# Patient Record
Sex: Female | Born: 1950 | Race: Black or African American | Hispanic: No | Smoking: Never smoker
Health system: Southern US, Community
[De-identification: ages and names within clinical notes are randomized; demographics above are authoritative.]

## PROBLEM LIST (undated history)

## (undated) DIAGNOSIS — Z803 Family history of malignant neoplasm of breast: Secondary | ICD-10-CM

## (undated) DIAGNOSIS — C801 Malignant (primary) neoplasm, unspecified: Secondary | ICD-10-CM

## (undated) DIAGNOSIS — C50919 Malignant neoplasm of unspecified site of unspecified female breast: Secondary | ICD-10-CM

## (undated) DIAGNOSIS — J40 Bronchitis, not specified as acute or chronic: Secondary | ICD-10-CM

## (undated) DIAGNOSIS — K219 Gastro-esophageal reflux disease without esophagitis: Secondary | ICD-10-CM

## (undated) DIAGNOSIS — I10 Essential (primary) hypertension: Secondary | ICD-10-CM

## (undated) DIAGNOSIS — E119 Type 2 diabetes mellitus without complications: Secondary | ICD-10-CM

## (undated) DIAGNOSIS — E7800 Pure hypercholesterolemia, unspecified: Secondary | ICD-10-CM

## (undated) DIAGNOSIS — Z800 Family history of malignant neoplasm of digestive organs: Secondary | ICD-10-CM

## (undated) DIAGNOSIS — Z8042 Family history of malignant neoplasm of prostate: Secondary | ICD-10-CM

## (undated) HISTORY — PX: COLONOSCOPY: SHX174

## (undated) HISTORY — DX: Family history of malignant neoplasm of prostate: Z80.42

## (undated) HISTORY — PX: TUBAL LIGATION: SHX77

## (undated) HISTORY — PX: WISDOM TOOTH EXTRACTION: SHX21

## (undated) HISTORY — DX: Family history of malignant neoplasm of digestive organs: Z80.0

## (undated) HISTORY — PX: ABDOMINAL HYSTERECTOMY: SUR658

## (undated) HISTORY — DX: Family history of malignant neoplasm of breast: Z80.3

## (undated) HISTORY — PX: ABDOMINAL HYSTERECTOMY: SHX81

---

## 2002-12-07 ENCOUNTER — Encounter: Payer: Self-pay | Admitting: Internal Medicine

## 2002-12-07 ENCOUNTER — Encounter: Admission: RE | Admit: 2002-12-07 | Discharge: 2002-12-07 | Payer: Self-pay | Admitting: Internal Medicine

## 2003-11-12 ENCOUNTER — Encounter: Admission: RE | Admit: 2003-11-12 | Discharge: 2004-02-10 | Payer: Self-pay | Admitting: Internal Medicine

## 2003-11-30 ENCOUNTER — Encounter (INDEPENDENT_AMBULATORY_CARE_PROVIDER_SITE_OTHER): Payer: Self-pay | Admitting: Specialist

## 2003-11-30 ENCOUNTER — Ambulatory Visit (HOSPITAL_COMMUNITY): Admission: RE | Admit: 2003-11-30 | Discharge: 2003-11-30 | Payer: Self-pay | Admitting: *Deleted

## 2004-02-14 ENCOUNTER — Other Ambulatory Visit: Admission: RE | Admit: 2004-02-14 | Discharge: 2004-02-14 | Payer: Self-pay | Admitting: Obstetrics and Gynecology

## 2004-03-13 ENCOUNTER — Encounter: Admission: RE | Admit: 2004-03-13 | Discharge: 2004-03-13 | Payer: Self-pay | Admitting: Obstetrics and Gynecology

## 2006-05-20 ENCOUNTER — Encounter: Admission: RE | Admit: 2006-05-20 | Discharge: 2006-05-20 | Payer: Self-pay | Admitting: Internal Medicine

## 2007-07-14 ENCOUNTER — Encounter: Admission: RE | Admit: 2007-07-14 | Discharge: 2007-07-14 | Payer: Self-pay | Admitting: Internal Medicine

## 2008-07-18 ENCOUNTER — Encounter: Admission: RE | Admit: 2008-07-18 | Discharge: 2008-07-18 | Payer: Self-pay | Admitting: Internal Medicine

## 2009-07-24 ENCOUNTER — Encounter: Admission: RE | Admit: 2009-07-24 | Discharge: 2009-07-24 | Payer: Self-pay | Admitting: Internal Medicine

## 2010-08-18 ENCOUNTER — Encounter
Admission: RE | Admit: 2010-08-18 | Discharge: 2010-08-18 | Payer: Self-pay | Source: Home / Self Care | Attending: Internal Medicine | Admitting: Internal Medicine

## 2010-08-24 ENCOUNTER — Encounter: Payer: Self-pay | Admitting: Internal Medicine

## 2010-08-24 ENCOUNTER — Encounter: Payer: Self-pay | Admitting: Cardiology

## 2011-08-11 ENCOUNTER — Other Ambulatory Visit: Payer: Self-pay | Admitting: Internal Medicine

## 2011-08-11 DIAGNOSIS — Z1231 Encounter for screening mammogram for malignant neoplasm of breast: Secondary | ICD-10-CM

## 2011-08-31 ENCOUNTER — Ambulatory Visit
Admission: RE | Admit: 2011-08-31 | Discharge: 2011-08-31 | Disposition: A | Discharge status: Home / Self Care | Payer: BC Managed Care – PPO | Source: Ambulatory Visit | Attending: Internal Medicine | Admitting: Internal Medicine

## 2011-08-31 DIAGNOSIS — Z1231 Encounter for screening mammogram for malignant neoplasm of breast: Secondary | ICD-10-CM

## 2012-11-07 ENCOUNTER — Other Ambulatory Visit: Payer: Self-pay

## 2012-11-07 DIAGNOSIS — Z1231 Encounter for screening mammogram for malignant neoplasm of breast: Secondary | ICD-10-CM

## 2012-12-13 ENCOUNTER — Ambulatory Visit
Admission: RE | Admit: 2012-12-13 | Discharge: 2012-12-13 | Disposition: A | Discharge status: Home / Self Care | Payer: BC Managed Care – PPO | Source: Ambulatory Visit

## 2012-12-13 DIAGNOSIS — Z1231 Encounter for screening mammogram for malignant neoplasm of breast: Secondary | ICD-10-CM

## 2013-12-05 ENCOUNTER — Other Ambulatory Visit: Payer: Self-pay

## 2013-12-05 DIAGNOSIS — Z1231 Encounter for screening mammogram for malignant neoplasm of breast: Secondary | ICD-10-CM

## 2013-12-14 ENCOUNTER — Encounter (INDEPENDENT_AMBULATORY_CARE_PROVIDER_SITE_OTHER): Payer: Self-pay

## 2013-12-14 ENCOUNTER — Ambulatory Visit
Admission: RE | Admit: 2013-12-14 | Discharge: 2013-12-14 | Disposition: A | Discharge status: Home / Self Care | Payer: BC Managed Care – PPO | Source: Ambulatory Visit

## 2013-12-14 DIAGNOSIS — Z1231 Encounter for screening mammogram for malignant neoplasm of breast: Secondary | ICD-10-CM

## 2015-03-21 ENCOUNTER — Ambulatory Visit: Payer: Self-pay | Admitting: Family Medicine

## 2016-06-17 ENCOUNTER — Other Ambulatory Visit: Payer: Self-pay | Admitting: Internal Medicine

## 2016-06-17 DIAGNOSIS — N63 Unspecified lump in unspecified breast: Secondary | ICD-10-CM

## 2016-07-23 ENCOUNTER — Ambulatory Visit
Admission: RE | Admit: 2016-07-23 | Discharge: 2016-07-23 | Disposition: A | Discharge status: Home / Self Care | Payer: Medicare Other | Source: Ambulatory Visit | Attending: Internal Medicine | Admitting: Internal Medicine

## 2016-07-23 ENCOUNTER — Other Ambulatory Visit: Payer: Self-pay | Admitting: Internal Medicine

## 2016-07-23 DIAGNOSIS — N63 Unspecified lump in unspecified breast: Secondary | ICD-10-CM

## 2016-07-24 ENCOUNTER — Ambulatory Visit
Admission: RE | Admit: 2016-07-24 | Discharge: 2016-07-24 | Disposition: A | Discharge status: Home / Self Care | Payer: Medicare Other | Source: Ambulatory Visit | Attending: Internal Medicine | Admitting: Internal Medicine

## 2016-07-24 DIAGNOSIS — N63 Unspecified lump in unspecified breast: Secondary | ICD-10-CM

## 2016-07-28 ENCOUNTER — Telehealth: Payer: Self-pay | Admitting: *Deleted

## 2016-07-28 NOTE — Telephone Encounter (Signed)
Confirmed [facility] for 08/05/16 at 815am .  Instructions and contact information given.

## 2016-07-31 ENCOUNTER — Other Ambulatory Visit: Payer: Self-pay | Admitting: *Deleted

## 2016-07-31 DIAGNOSIS — C50411 Malignant neoplasm of upper-outer quadrant of right female breast: Secondary | ICD-10-CM

## 2016-07-31 DIAGNOSIS — Z170 Estrogen receptor positive status [ER+]: Principal | ICD-10-CM

## 2016-08-05 ENCOUNTER — Ambulatory Visit: Payer: Self-pay | Admitting: General Surgery

## 2016-08-05 ENCOUNTER — Encounter: Payer: Self-pay | Admitting: *Deleted

## 2016-08-05 ENCOUNTER — Encounter: Payer: Self-pay | Admitting: Genetic Counselor

## 2016-08-05 ENCOUNTER — Other Ambulatory Visit: Payer: Self-pay | Admitting: *Deleted

## 2016-08-05 ENCOUNTER — Other Ambulatory Visit (HOSPITAL_BASED_OUTPATIENT_CLINIC_OR_DEPARTMENT_OTHER): Payer: Medicare Other

## 2016-08-05 ENCOUNTER — Ambulatory Visit (HOSPITAL_BASED_OUTPATIENT_CLINIC_OR_DEPARTMENT_OTHER): Payer: Medicare Other | Admitting: Hematology and Oncology

## 2016-08-05 ENCOUNTER — Other Ambulatory Visit: Payer: Self-pay | Admitting: Hematology and Oncology

## 2016-08-05 ENCOUNTER — Ambulatory Visit
Admission: RE | Admit: 2016-08-05 | Discharge: 2016-08-05 | Disposition: A | Discharge status: Home / Self Care | Payer: Medicare Other | Source: Ambulatory Visit | Attending: Radiation Oncology | Admitting: Radiation Oncology

## 2016-08-05 ENCOUNTER — Ambulatory Visit: Payer: Medicare Other | Attending: General Surgery | Admitting: Physical Therapy

## 2016-08-05 ENCOUNTER — Encounter: Payer: Self-pay | Admitting: Radiation Oncology

## 2016-08-05 VITALS — BP 162/75 | HR 87 | Temp 97.9°F | Resp 18 | Ht 63.5 in | Wt 171.2 lb

## 2016-08-05 DIAGNOSIS — C50411 Malignant neoplasm of upper-outer quadrant of right female breast: Secondary | ICD-10-CM

## 2016-08-05 DIAGNOSIS — Z170 Estrogen receptor positive status [ER+]: Secondary | ICD-10-CM

## 2016-08-05 DIAGNOSIS — R293 Abnormal posture: Secondary | ICD-10-CM | POA: Diagnosis present

## 2016-08-05 HISTORY — DX: Type 2 diabetes mellitus without complications: E11.9

## 2016-08-05 HISTORY — DX: Essential (primary) hypertension: I10

## 2016-08-05 LAB — COMPREHENSIVE METABOLIC PANEL
ALBUMIN: 4.3 g/dL (ref 3.5–5.0)
ALK PHOS: 106 U/L (ref 40–150)
ALT: 13 U/L (ref 0–55)
AST: 13 U/L (ref 5–34)
Anion Gap: 12 mEq/L — ABNORMAL HIGH (ref 3–11)
BUN: 11.2 mg/dL (ref 7.0–26.0)
CALCIUM: 10.1 mg/dL (ref 8.4–10.4)
CO2: 25 mEq/L (ref 22–29)
CREATININE: 0.8 mg/dL (ref 0.6–1.1)
Chloride: 103 mEq/L (ref 98–109)
EGFR: >90 mL/min/{1.73_m2} (ref >90)
GLUCOSE: 205 mg/dL — AB (ref 70–140)
POTASSIUM: 3.9 meq/L (ref 3.5–5.1)
SODIUM: 140 meq/L (ref 136–145)
Total Bilirubin: 0.33 mg/dL (ref 0.20–1.20)
Total Protein: 8.2 g/dL (ref 6.4–8.3)

## 2016-08-05 LAB — CBC WITH DIFFERENTIAL/PLATELET
BASO%: 0.1 % (ref 0.0–2.0)
BASOS ABS: 0 10*3/uL (ref 0.0–0.1)
EOS%: 3.3 % (ref 0.0–7.0)
Eosinophils Absolute: 0.3 10*3/uL (ref 0.0–0.5)
HEMATOCRIT: 40 % (ref 34.8–46.6)
HEMOGLOBIN: 13.5 g/dL (ref 11.6–15.9)
LYMPH#: 2.3 10*3/uL (ref 0.9–3.3)
LYMPH%: 27.6 % (ref 14.0–49.7)
MCH: 28.4 pg (ref 25.1–34.0)
MCHC: 33.8 g/dL (ref 31.5–36.0)
MCV: 84.2 fL (ref 79.5–101.0)
MONO#: 0.7 10*3/uL (ref 0.1–0.9)
MONO%: 8.5 % (ref 0.0–14.0)
NEUT%: 60.5 % (ref 38.4–76.8)
NEUTROS ABS: 5.1 10*3/uL (ref 1.5–6.5)
Platelets: 257 10*3/uL (ref 145–400)
RBC: 4.75 10*6/uL (ref 3.70–5.45)
RDW: 12.8 % (ref 11.2–14.5)
WBC: 8.4 10*3/uL (ref 3.9–10.3)

## 2016-08-05 MED ORDER — LIDOCAINE-PRILOCAINE 2.5-2.5 % EX CREA
TOPICAL_CREAM | CUTANEOUS | 3 refills | Status: DC
Start: 2016-08-05 — End: 2016-12-15

## 2016-08-05 MED ORDER — LORAZEPAM 0.5 MG PO TABS: 0.5000 mg | ORAL_TABLET | Freq: Four times a day (QID) | ORAL | 0 refills | Status: DC | PRN

## 2016-08-05 MED ORDER — ONDANSETRON HCL 8 MG PO TABS: 8.0000 mg | ORAL_TABLET | Freq: Two times a day (BID) | ORAL | 1 refills | Status: DC | PRN

## 2016-08-05 MED ORDER — LIDOCAINE-PRILOCAINE 2.5-2.5 % EX CREA
TOPICAL_CREAM | CUTANEOUS | 3 refills | Status: DC
Start: 2016-08-05 — End: 2016-08-05

## 2016-08-05 MED ORDER — PROCHLORPERAZINE MALEATE 10 MG PO TABS: 10.0000 mg | ORAL_TABLET | Freq: Four times a day (QID) | ORAL | 1 refills | Status: DC | PRN

## 2016-08-05 MED ORDER — LIDOCAINE-PRILOCAINE 2.5-2.5 % EX CREA: TOPICAL_CREAM | CUTANEOUS | 3 refills | Status: DC

## 2016-08-05 NOTE — Progress Notes (Signed)
Radiation Oncology         [phone] ________________________________  Name: Casey Olson MRN: [id number]  Date: 08/05/2016  DOB: [birth year]  ZC:HYIFO A Jeanie Cooks, MD  Jovita Kussmaul, MD     REFERRING PHYSICIAN: Autumn Messing III, MD   DIAGNOSIS: The encounter diagnosis was Malignant neoplasm of upper-outer quadrant of right breast in female, estrogen receptor positive ([place]).   HISTORY OF PRESENT ILLNESS: Casey Olson is a 66 y.o. female seen in the multidisciplinary breast clinic for a new diagnosis of right breast cancer. The patient was found to have a palpable mass for about 2 months in the right breast. Diagnostic imaging revealed a 4.7 x 4.1 cm mass in the right breast with suspicious cortical thickening in the right axillary node seen on ultrasound. A biopsy of both the breast tumor and node were performed. The breast biopsy revealed ER weakly positive, PR negative, HER 2 negative, Ki 67 90%, grade 3 invasive ductal carcinoma. Her lymph node was benign on biopsy. She comes today to discusses the options for treatment of her cancer.  PREVIOUS RADIATION THERAPY: No   PAST MEDICAL HISTORY:  Past Medical History:  Diagnosis Date  . Diabetes mellitus without complication ([place])   . Hypertension        PAST SURGICAL HISTORY:No past surgical history on file.   FAMILY HISTORY:  Family History  Problem Relation Age of Onset  . Breast cancer Cousin 65    mat first cousin     SOCIAL HISTORY:   The patient is single and resides in [place]. She denies any tobacco use, or alcohol use. 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 She has two adult daughters, who accompany her.   ALLERGIES: Patient has no allergy information on record.   MEDICATIONS:  No current outpatient prescriptions on file.   No current facility-administered medications for this encounter.      REVIEW OF SYSTEMS: On review of systems, the patient reports that she is doing well  overall. She denies any chest pain, shortness of breath, cough, fevers, chills, night sweats, unintended weight changes. She denies any bowel or bladder disturbances, and denies abdominal pain, nausea or vomiting. She denies any new musculoskeletal or joint aches or pains. A complete review of systems is obtained and is otherwise negative.    PHYSICAL EXAM:  Wt Readings from Last 3 Encounters:  08/05/16 171 lb 3.2 oz (77.7 kg)   Temp Readings from Last 3 Encounters:  08/05/16 97.9 F (36.6 C) (Oral)   BP Readings from Last 3 Encounters:  08/05/16 (!) 162/75   Pulse Readings from Last 3 Encounters:  08/05/16 87    In general this is a well appearing African American female in no acute distress. She is alert and oriented x4 and appropriate throughout the examination. HEENT reveals that the patient is normocephalic, atraumatic. EOMs are intact. PERRLA. Skin is intact without any evidence of gross lesions. Cardiovascular exam reveals a regular rate and rhythm, no clicks rubs or murmurs are auscultated. Chest is clear to auscultation bilaterally. Lymphatic assessment is performed and does not reveal any adenopathy in the cervical, supraclavicular, axillary, or inguinal chains. Abdomen has active bowel sounds in all quadrants and is intact. The abdomen is soft, non tender, non distended. Lower extremities are negative for pretibial pitting edema, deep calf tenderness, cyanosis or clubbing. On examination of the breast right breast, there is minimal ecchymoses of the biopsy site without bleeding. There is fullness in the  upper outer quadrant of the breast, and this is about 3..5 cm in greatest dimension. The left breast is intact without palpable mass or lesions. No nipple discharge is noted of either breast.  ECOG = 1  0 - Asymptomatic (Fully active, able to carry on all predisease activities without restriction)  1 - Symptomatic but completely ambulatory (Restricted in physically strenuous  activity but ambulatory and able to carry out work of a light or sedentary nature. For example, light housework, office work)  2 - Symptomatic, <50% in bed during the day (Ambulatory and capable of all self care but unable to carry out any work activities. Up and about more than 50% of waking hours)  3 - Symptomatic, >50% in bed, but not bedbound (Capable of only limited self-care, confined to bed or chair 50% or more of waking hours)  4 - Bedbound (Completely disabled. Cannot carry on any self-care. Totally confined to bed or chair)  5 - Death   Eustace Pen MM, Creech RH, Tormey DC, et al. [phone]). "Toxicity and response criteria of the [facility]". Cut Off Oncol. 5 (6): 649-55    LABORATORY DATA:  Lab Results  Component Value Date   WBC 8.4 08/05/2016   HGB 13.5 08/05/2016   HCT 40.0 08/05/2016   MCV 84.2 08/05/2016   PLT 257 08/05/2016   Lab Results  Component Value Date   NA 140 08/05/2016   K 3.9 08/05/2016   CO2 25 08/05/2016   Lab Results  Component Value Date   ALT 13 08/05/2016   AST 13 08/05/2016   ALKPHOS 106 08/05/2016   BILITOT 0.33 08/05/2016      RADIOGRAPHY: US Breast Ltd Uni Right Inc Axilla  Result Date: 07/23/2016 CLINICAL DATA:  Right breast palpable mass felt by the patient 2 months ago. Patient also complains of diffuse left breast tenderness. EXAM: 2D DIGITAL DIAGNOSTIC BILATERAL MAMMOGRAM WITH CAD AND ADJUNCT TOMO ULTRASOUND RIGHT BREAST COMPARISON:  Previous exam(s). ACR Breast Density Category d: The breast tissue is extremely dense, which lowers the sensitivity of mammography. FINDINGS: Mammographically, there is a larger isodense to breast parenchyma mass in the right breast upper outer quadrant, posterior depth, mostly seen on the MLO view. There is slight skin thickening and dimpling in the lateral right breast. There is also an enlarged right axillary lymph node. No other masses, areas of architectural distortion or  clustered calcifications are seen bilaterally. Mammographic images were processed with CAD. On physical exam, there is a large firm palpable mass in the right breast upper outer quadrant, posterior depth. Targeted ultrasound is performed, showing right breast 10 o'clock 5 cm from the nipple hypoechoic lobulated solid mass which measures 4.7 by 3.7 by 4.1 cm. Internal blood flow is documented. There is a low right axillary lymph node which demonstrates mild cortical thickening. IMPRESSION: Highly suspicious right breast palpable mass, for which ultrasound-guided core needle biopsy is recommended. Indeterminate low right axillary lymph node, for which ultrasound-guided core needle biopsy is recommended. No mammographic evidence of malignancy in the left breast. RECOMMENDATION: Ultrasound-guided core needle biopsy of right breast and right axilla. I have discussed the findings and recommendations with the patient. Results were also provided in writing at the conclusion of the visit. If applicable, a reminder letter will be sent to the patient regarding the next appointment. BI-RADS CATEGORY  5: Highly suggestive of malignancy. Electronically Signed   By: Fidela Salisbury M.D.   On: 07/23/2016 09:21   Mm Diag Breast Tomo  Bilateral  Result Date: 07/23/2016 CLINICAL DATA:  Right breast palpable mass felt by the patient 2 months ago. Patient also complains of diffuse left breast tenderness. EXAM: 2D DIGITAL DIAGNOSTIC BILATERAL MAMMOGRAM WITH CAD AND ADJUNCT TOMO ULTRASOUND RIGHT BREAST COMPARISON:  Previous exam(s). ACR Breast Density Category d: The breast tissue is extremely dense, which lowers the sensitivity of mammography. FINDINGS: Mammographically, there is a larger isodense to breast parenchyma mass in the right breast upper outer quadrant, posterior depth, mostly seen on the MLO view. There is slight skin thickening and dimpling in the lateral right breast. There is also an enlarged right axillary lymph  node. No other masses, areas of architectural distortion or clustered calcifications are seen bilaterally. Mammographic images were processed with CAD. On physical exam, there is a large firm palpable mass in the right breast upper outer quadrant, posterior depth. Targeted ultrasound is performed, showing right breast 10 o'clock 5 cm from the nipple hypoechoic lobulated solid mass which measures 4.7 by 3.7 by 4.1 cm. Internal blood flow is documented. There is a low right axillary lymph node which demonstrates mild cortical thickening. IMPRESSION: Highly suspicious right breast palpable mass, for which ultrasound-guided core needle biopsy is recommended. Indeterminate low right axillary lymph node, for which ultrasound-guided core needle biopsy is recommended. No mammographic evidence of malignancy in the left breast. RECOMMENDATION: Ultrasound-guided core needle biopsy of right breast and right axilla. I have discussed the findings and recommendations with the patient. Results were also provided in writing at the conclusion of the visit. If applicable, a reminder letter will be sent to the patient regarding the next appointment. BI-RADS CATEGORY  5: Highly suggestive of malignancy. Electronically Signed   By: Fidela Salisbury M.D.   On: 07/23/2016 09:21   Mm Clip Placement Right  Result Date: 07/24/2016 CLINICAL DATA:  Post biopsy mammogram of the right breast for clip placement. EXAM: DIAGNOSTIC RIGHT MAMMOGRAM POST ULTRASOUND BIOPSY COMPARISON:  Previous exam(s). FINDINGS: Mammographic images were obtained following ultrasound guided biopsy of right breast mass and right axillary lymph node. The coil shaped biopsy marking clip is well positioned within the biopsied mass in the upper-outer right breast. [facility] spring shaped biopsy marking clip is positioned at the site of the biopsied lymph node in the right axilla. IMPRESSION: 1. Appropriate positioning of the coil shaped biopsy marking clip within  the mass in the right breast at 10 o'clock. 2. Appropriate positioning of the [facility] biopsy marking clip in the biopsied right axillary lymph node. Final Assessment: Post Procedure Mammograms for Marker Placement Electronically Signed   By: Ammie Ferrier M.D.   On: 07/24/2016 11:01   [place] Rt Breast Bx W Loc Dev 1st Lesion Img Bx Spec US Guide  Addendum Date: 07/29/2016   ADDENDUM REPORT: 07/28/2016 12:37 ADDENDUM: Pathology revealed GRADE III INVASIVE DUCTAL CARCINOMA of the Right breast, 10 o'clock. THERE IS NO EVIDENCE OF CARCINOMA 1 OF 1 LYMPH NODE of the Right axilla. This was found to be concordant by Dr. Franki Cabot. Pathology results were discussed with the patient by telephone. The patient reported doing well after the biopsies with tenderness and itching at the sites. Post biopsy instructions and care were reviewed and questions were answered. The patient was encouraged to call [facility] for any additional concerns. The patient was referred to [facility] on Olson 3, 2018. Pathology results reported by Terie Purser, RN on 07/28/2016. Electronically Signed  By: Ammie Ferrier M.D.   On: 07/28/2016 12:37   Result Date: 07/29/2016 CLINICAL DATA:  66 year old female presenting for ultrasound-guided biopsy of a palpable right breast mass and a right axillary lymph node. EXAM: ULTRASOUND GUIDED RIGHT BREAST CORE NEEDLE BIOPSY COMPARISON:  Previous exam(s). FINDINGS: I met with the patient and we discussed the procedure of ultrasound-guided biopsy, including benefits and alternatives. We discussed the high likelihood of a successful procedure. We discussed the risks of the procedure, including infection, bleeding, tissue injury, clip migration, and inadequate sampling. Informed written consent was given. The usual time-out protocol was performed immediately prior to the procedure. Using  sterile technique and 1% Lidocaine as local anesthetic, under direct ultrasound visualization, a 14 gauge spring-loaded device was used to perform biopsy of the right breast mass at 10 o'clock using a lateral approach. At the conclusion of the procedure a coil shaped tissue marker clip was deployed into the biopsy cavity. Using sterile technique and 1% Lidocaine as local anesthetic, under direct ultrasound visualization, a 14 gauge spring-loaded device was used to perform biopsy of a right axillary lymph node using an inferior approach through the same incision site as used above. At the conclusion of the procedure a spring shaped HydroMARK tissue marker clip was deployed into the biopsy cavity. Follow up 2 view mammogram was performed and dictated separately. IMPRESSION: 1. Ultrasound guided biopsy of the palpable right breast mass at 10 o'clock. No apparent complications. 2. Ultrasound-guided biopsy of a right axillary lymph node. No apparent complications. Electronically Signed: By: Ammie Ferrier M.D. On: 07/24/2016 11:02   [place] Rt Breast Bx W Loc Dev Ea Add Lesion Img Bx Spec US Guide  Addendum Date: 07/29/2016   ADDENDUM REPORT: 07/28/2016 12:37 ADDENDUM: Pathology revealed GRADE III INVASIVE DUCTAL CARCINOMA of the Right breast, 10 o'clock. THERE IS NO EVIDENCE OF CARCINOMA 1 OF 1 LYMPH NODE of the Right axilla. This was found to be concordant by Dr. Franki Cabot. Pathology results were discussed with the patient by telephone. The patient reported doing well after the biopsies with tenderness and itching at the sites. Post biopsy instructions and care were reviewed and questions were answered. The patient was encouraged to call [facility] for any additional concerns. The patient was referred to [facility] on Olson 3, 2018. Pathology results reported by Terie Purser, RN on 07/28/2016.  Electronically Signed   By: Ammie Ferrier M.D.   On: 07/28/2016 12:37   Result Date: 07/29/2016 CLINICAL DATA:  66 year old female presenting for ultrasound-guided biopsy of a palpable right breast mass and a right axillary lymph node. EXAM: ULTRASOUND GUIDED RIGHT BREAST CORE NEEDLE BIOPSY COMPARISON:  Previous exam(s). FINDINGS: I met with the patient and we discussed the procedure of ultrasound-guided biopsy, including benefits and alternatives. We discussed the high likelihood of a successful procedure. We discussed the risks of the procedure, including infection, bleeding, tissue injury, clip migration, and inadequate sampling. Informed written consent was given. The usual time-out protocol was performed immediately prior to the procedure. Using sterile technique and 1% Lidocaine as local anesthetic, under direct ultrasound visualization, a 14 gauge spring-loaded device was used to perform biopsy of the right breast mass at 10 o'clock using a lateral approach. At the conclusion of the procedure a coil shaped tissue marker clip was deployed into the biopsy cavity. Using sterile technique and 1% Lidocaine as local anesthetic, under direct ultrasound visualization, a 14 gauge  spring-loaded device was used to perform biopsy of a right axillary lymph node using an inferior approach through the same incision site as used above. At the conclusion of the procedure a spring shaped HydroMARK tissue marker clip was deployed into the biopsy cavity. Follow up 2 view mammogram was performed and dictated separately. IMPRESSION: 1. Ultrasound guided biopsy of the palpable right breast mass at 10 o'clock. No apparent complications. 2. Ultrasound-guided biopsy of a right axillary lymph node. No apparent complications. Electronically Signed: By: Ammie Ferrier M.D. On: 07/24/2016 11:02       IMPRESSION/PLAN: 1. Stage IIA, T2N0Mx, high grade, ER weakly positive, PR negative, invasive ductal carcinoma of the right  breast, with a Ki 67 of 90%.  Dr. Lisbeth Renshaw discusses the pathology findings and reviews the nature of breast disease. The consensus from the breast conference include either mastectomy upfront followed by chemotherapy and antiestrogen therapy versus neoadjuvant chemotherapy, possible breast conservation surgery, radiotherapy, and antiestrogen treatment. If she elects and is a candidate for lumpectomy after neoadjuvant therapy, if she chose this route, she would be a candidate for radiotherapy, as above. We discussed the risks, benefits, short, and long term effects of radiotherapy. Dr. Lisbeth Renshaw discusses the delivery and logistics of radiotherapy. If she elected to proceed with lumpectomy, we would plan to see her 2 weeks after surgery to move forward with the simulation and planning process and anticipate starting radiotherapy about 4 weeks after surgery. She states agreement and we will proceed as outlined. She also has completed an MRI screening sheet, and MRI would be considered if she was choosing neoadjuvant treatment. 2. Genetics. The patient is a candidate for genetic testing and will be referred for this.  The above documentation reflects my direct findings during this shared patient visit. Please see the separate note by Dr. Lisbeth Renshaw on this date for the remainder of the patient's plan of care.    Carola Rhine, PAC  This document serves as a record of services personally performed by Kyung Rudd, MD and Shona Simpson, PA. It was created on their behalf by Maryla Morrow, a trained medical scribe. The creation of this record is based on the scribe's personal observations and the provider's statements to them. This document has been checked and approved by the attending provider.

## 2016-08-05 NOTE — Patient Instructions (Signed)

## 2016-08-05 NOTE — Therapy (Signed)
Casey Olson, [place], [address] Phone: [phone]   Fax:  [phone]  Physical Therapy Evaluation  Patient Details  Name: Casey Olson MRN: [id number] Date of Birth: [birth year] Referring Provider: Dr. Autumn Messing  Encounter Date: 08/05/2016      PT End of Session - 08/05/16 1130    Visit Number 1   Number of Visits 1   PT Start Time 1034   PT Stop Time 7371  Also saw pt from 1150-1210 for a total of 28 minutes   PT Time Calculation (min) 8 min   Activity Tolerance Patient tolerated treatment well   Behavior During Therapy [facility] for tasks assessed/performed      Past Medical History:  Diagnosis Date  . Diabetes mellitus without complication ([place])   . Hypertension     History reviewed. No pertinent surgical history.  There were no vitals filed for this visit.       Subjective Assessment - 08/05/16 1131    Subjective Patient reports she is here today to be seen by her medical team for her newly diagnosed right breast cancer.   Patient is accompained by: Family member   Pertinent History Patient was diagnosed on 07/23/16 with right grade 3 invasive ductal carcinoma breast cancer.  It is located in the upper outer quadrant and measures 4.7 cm, is ER positive and PR negative, HEr2 negative and has a Ki67 of 90%.     Patient Stated Goals Reduce lymphedema risk and learn post op shoulder ROM HEP            [facility] PT Assessment - 08/05/16 0001      Assessment   Medical Diagnosis Right breast cancer   Referring Provider Dr. Autumn Messing   Onset Date/Surgical Date 07/23/16   Hand Dominance Right   Prior Therapy none     Precautions   Precautions Other (comment)   Precaution Comments Active cancer     Restrictions   Weight Bearing Restrictions No     Balance Screen   Has the patient fallen in the past 6 months No   Has the patient had a decrease in activity level because of a fear of  falling?  No   Is the patient reluctant to leave their home because of a fear of falling?  No     Home Environment   Living Environment Private residence   Living Arrangements Children  Daughter and 52 y.o. granddaughter   Available Help at Discharge Family     Prior Function   Level of [place] Retired   Leisure She does not exercise     Cognition   Overall Cognitive Status Within Functional Limits for tasks assessed     Posture/Postural Control   Posture/Postural Control Postural limitations   Postural Limitations Rounded Shoulders;Forward head     ROM / Strength   AROM / PROM / Strength AROM;Strength     AROM   AROM Assessment Site Shoulder;Cervical   Right/Left Shoulder Right;Left   Right Shoulder Extension 47 Degrees   Right Shoulder Flexion 140 Degrees   Right Shoulder ABduction 156 Degrees   Right Shoulder Internal Rotation 72 Degrees   Right Shoulder External Rotation 68 Degrees   Left Shoulder Extension 57 Degrees   Left Shoulder Flexion 139 Degrees   Left Shoulder ABduction 158 Degrees   Left Shoulder Internal Rotation 65 Degrees   Left Shoulder External Rotation 80 Degrees   Cervical Flexion WNl  Cervical Extension WNL   Cervical - Right Side Bend WNL   Cervical - Left Side Bend WNL   Cervical - Right Rotation WNL   Cervical - Left Rotation WNL     Strength   Overall Strength Within functional limits for tasks performed           LYMPHEDEMA/ONCOLOGY QUESTIONNAIRE - 08/05/16 1129      Type   Cancer Type Right breast cancer     Lymphedema Assessments   Lymphedema Assessments Upper extremities     Right Upper Extremity Lymphedema   10 cm Proximal to Olecranon Process 31.4 cm   Olecranon Process 26.6 cm   10 cm Proximal to Ulnar Styloid Process 24.1 cm   Just Proximal to Ulnar Styloid Process 17.3 cm   Across Hand at [employer] 20.2 cm   At [place] of 2nd Digit 6.8 cm     Left Upper Extremity Lymphedema   10 cm  Proximal to Olecranon Process 31 cm   Olecranon Process 25.7 cm   10 cm Proximal to Ulnar Styloid Process 23 cm   Just Proximal to Ulnar Styloid Process 17.4 cm   Across Hand at [employer] 19.1 cm   At [place] of 2nd Digit 6.3 cm      Patient was instructed today in a home exercise program today for post op shoulder range of motion. These included active assist shoulder flexion in sitting, scapular retraction, wall walking with shoulder abduction, and hands behind head external rotation.  She was encouraged to do these twice a day, holding 3 seconds and repeating 5 times when permitted by her physician.         PT Education - 08/05/16 1129    Education provided Yes   Education Details Lymphedema risk reduction and post op shoulder ROM HEP   Person(s) Educated Patient;Child(ren)   Methods Explanation;Demonstration;Handout   Comprehension Returned demonstration;Verbalized understanding              Breast Clinic Goals - 08/05/16 1135      Patient will be able to verbalize understanding of pertinent lymphedema risk reduction practices relevant to her diagnosis specifically related to skin care.   Time 1   Period Days   Status Achieved     Patient will be able to return demonstrate and/or verbalize understanding of the post-op home exercise program related to regaining shoulder range of motion.   Time 1   Period Days   Status Achieved     Patient will be able to verbalize understanding of the importance of attending the postoperative After Breast Cancer Class for further lymphedema risk reduction education and therapeutic exercise.   Time 1   Period Days   Status Achieved              Plan - 08/05/16 1131    Clinical Impression Statement Patient was diagnosed on 07/23/16 with right grade 3 invasive ductal carcinoma breast cancer.  It is located in the upper outer quadrant and measures 4.7 cm, is ER positive and PR negative, HEr2 negative and has a Ki67 of 90%.   Her multidisciplinary medical team met prior to her assessments to determine a recommended treatment plan.  She is planning to have a right mastectomy and sentinel node biopsy followed by chemotherapy or she may have neoadjuvant chemotherapy followed by a lumpectomy or mastectomy with a sentinel node biopsy. She will likely undergo radiation and anti-estrogen therapy. She may benefit from post op PT to regain  shoulder ROM and reduce lymphedema risk. Due to her lack of comorbidities that would impact rehab, her eval is of low complexity.   Rehab Potential Excellent   PT Frequency One time visit   PT Treatment/Interventions Patient/family education;Therapeutic exercise   PT Next Visit Plan Will f/u after surgery to determine PT needs   PT Home Exercise Plan Post op shoulder ROM HEP   Consulted and Agree with Plan of Care Patient;Family member/caregiver   Family Member Consulted daughters and sister      Patient will benefit from skilled therapeutic intervention in order to improve the following deficits and impairments:  Postural dysfunction, Decreased knowledge of precautions, Pain, Impaired UE functional use, Decreased range of motion  Visit Diagnosis: Carcinoma of upper-outer quadrant of right breast in female, estrogen receptor positive ([place]) - Plan: PT plan of care cert/re-cert  Abnormal posture - Plan: PT plan of care cert/re-cert      G-Codes - 63/84/66 1136    Functional Assessment Tool Used Clinical Judgement   Functional Limitation Other PT primary   Other PT Primary Current Status (Z9935) At least 1 percent but less than 20 percent impaired, limited or restricted   Other PT Primary Goal Status (T0177) At least 1 percent but less than 20 percent impaired, limited or restricted   Other PT Primary Discharge Status (L3903) At least 1 percent but less than 20 percent impaired, limited or restricted     Patient will follow up at outpatient cancer rehab if needed following surgery.  If  the patient requires physical therapy at that time, a specific plan will be dictated and sent to the referring physician for approval. The patient was educated today on appropriate basic range of motion exercises to begin post operatively and the importance of attending the After Breast Cancer class following surgery.  Patient was educated today on lymphedema risk reduction practices as it pertains to recommendations that will benefit the patient immediately following surgery.  She verbalized good understanding.  No additional physical therapy is indicated at this time.     Problem List Patient Active Problem List   Diagnosis Date Noted  . Breast cancer of upper-outer quadrant of right female breast ([place]) 07/31/2016    Annia Friendly, PT 08/05/16 12:14 PM  [place], [place], [address] Phone: [phone]   Fax:  [phone]  Name: Casey Olson MRN: [id number] Date of Birth: 05-20-1951

## 2016-08-05 NOTE — Progress Notes (Signed)
[facility] CONSULT NOTE  Patient Care Team: Nolene Ebbs, MD as PCP - General (Internal Medicine) Autumn Messing III, MD as Consulting Physician (General Surgery) Nicholas Lose, MD as Consulting Physician (Hematology and Oncology) Kyung Rudd, MD as Consulting Physician (Radiation Oncology)  CHIEF COMPLAINTS/PURPOSE OF CONSULTATION:  Newly diagnosed breast cancer  HISTORY OF PRESENTING ILLNESS:  Casey Olson 66 y.o. female is here because of recent diagnosis of right breast invasive ductal carcinoma. Patient felt a palpable right breast mass with a past 2 months. She underwent mammogram and ultrasound that revealed a 4.7 cm tumor with enlarged axillary lymph node. Biopsy of the tumor revealed invasive ductal carcinoma that was ER 30% weakly positive PR negative and HER-2 negative with a Ki-67 of 90%. It was grade 3. Lymph node was benign. She was presented this morning in the multidisciplinary tumor board and she is here today accompanied by her family to discuss treatment plan.  I reviewed her records extensively and collaborated the history with the patient.  SUMMARY OF ONCOLOGIC HISTORY:   Breast cancer of upper-outer quadrant of right female breast ([place])   07/24/2016 Initial Diagnosis    Palpable right breast mass for 2 months UOQ at 10:00: 4.7 x 4.1 x 3.7 cm with enlarged axillary lymph node biopsy benign, breast mass biopsy grade 3 IDC ER 30% week PR negative HER-2 negative Ki-67 90%      MEDICAL HISTORY:  Past Medical History:  Diagnosis Date  . Diabetes mellitus without complication ([place])   . Hypertension     SURGICAL HISTORY: No past surgical history on file.  SOCIAL HISTORY:Denies any tobacco alcohol or recreational drug use ALLERGIES:  has no allergies on file. Family history: She works as a Presenter, broadcasting. She has 2 daughters. MEDICATIONS: Does not take any medications REVIEW OF SYSTEMS:   Constitutional: Denies fevers, chills or abnormal  night sweats Eyes: Denies blurriness of vision, double vision or watery eyes Ears, nose, mouth, throat, and face: Denies mucositis or sore throat Respiratory: Denies cough, dyspnea or wheezes Cardiovascular: Denies palpitation, chest discomfort or lower extremity swelling Gastrointestinal:  Denies nausea, heartburn or change in bowel habits Skin: Denies abnormal skin rashes Lymphatics: Denies new lymphadenopathy or easy bruising Neurological:Denies numbness, tingling or new weaknesses Behavioral/Psych: Mood is stable, no new changes  Breast: Palpable lump in the right breast All other systems were reviewed with the patient and are negative.  PHYSICAL EXAMINATION: ECOG PERFORMANCE STATUS: 1 - Symptomatic but completely ambulatory  Vitals:   08/05/16 0928  BP: (!) 162/75  Pulse: 87  Resp: 18  Temp: 97.9 F (36.6 C)   Filed Weights   08/05/16 0928  Weight: 171 lb 3.2 oz (77.7 kg)    GENERAL:alert, no distress and comfortable SKIN: skin color, texture, turgor are normal, no rashes or significant lesions EYES: normal, conjunctiva are pink and non-injected, sclera clear OROPHARYNX:no exudate, no erythema and lips, buccal mucosa, and tongue normal  NECK: supple, thyroid normal size, non-tender, without nodularity LYMPH:  no palpable lymphadenopathy in the cervical, axillary or inguinal LUNGS: clear to auscultation and percussion with normal breathing effort HEART: regular rate & rhythm and no murmurs and no lower extremity edema ABDOMEN:abdomen soft, non-tender and normal bowel sounds Musculoskeletal:no cyanosis of digits and no clubbing  PSYCH: alert & oriented x 3 with fluent speech NEURO: no focal motor/sensory deficits BREAST:Palpable lump in the right breast. No palpable axillary or supraclavicular lymphadenopathy (exam performed in the presence of a chaperone)   LABORATORY  DATA:  I have reviewed the data as listed Lab Results  Component Value Date   WBC 8.4 08/05/2016    HGB 13.5 08/05/2016   HCT 40.0 08/05/2016   MCV 84.2 08/05/2016   PLT 257 08/05/2016   Lab Results  Component Value Date   NA 140 08/05/2016   K 3.9 08/05/2016   CO2 25 08/05/2016    RADIOGRAPHIC STUDIES: I have personally reviewed the radiological reports and agreed with the findings in the report.  ASSESSMENT AND PLAN:  Breast cancer of upper-outer quadrant of right female breast ([place]) 07/24/2016: Palpable right breast mass for 2 months UOQ at 10:00: 4.7 x 4.1 x 3.7 cm with enlarged axillary lymph node biopsy benign, breast mass biopsy grade 3 IDC ER 30% week PR negative HER-2 negative Ki-67 90%  Pathology and radiology counseling: Discussed with the patient, the details of pathology including the type of breast cancer,the clinical staging, the significance of ER, PR and HER-2/neu receptors and the implications for treatment. After reviewing the pathology in detail, we proceeded to discuss the different treatment options between surgery, radiation, chemotherapy, antiestrogen therapies.  Recommendation based on multidisciplinary tumor board: 1. Neoadjuvant chemotherapy with Adriamycin and Cytoxan dose dense 4 followed by Abraxane weekly 12 (Patient cannot state steroids due to diabetes and so we are not using Taxol) 2. Followed by breast conserving surgery with sentinel lymph node study vs mastectomy 3. Followed by adjuvant radiation therapy 4. Followed by adjuvant antiestrogen therapy  Chemotherapy Counseling: I discussed the risks and benefits of chemotherapy including the risks of nausea/ vomiting, risk of infection from low WBC count, fatigue due to chemo or anemia, bruising or bleeding due to low platelets, mouth sores, loss/ change in taste and decreased appetite. Liver and kidney function will be monitored through out chemotherapy as abnormalities in liver and kidney function may be a side effect of treatment. Cardiac dysfunction due to Adriamycin was discussed in detail. Risk of  permanent bone marrow dysfunction due to chemo were also discussed.  Plan: 1. Port placement 2. Echocardiogram 3. Chemotherapy class 4. Breast MRI 5. Genetic counseling will also be arranged  Return to clinic in 2 weeks to start chemotherapy.  All questions were answered. The patient knows to call the clinic with any problems, questions or concerns.    Rulon Eisenmenger, MD 08/05/16

## 2016-08-05 NOTE — Progress Notes (Signed)
START OFF PATHWAY REGIMEN - Breast  Off Pathway: Doxorubicin + Cyclophosphamide (AC) followed by Nab-Paclitaxel (Abraxane(R)) 100 mg/m2 Days 1, 8, and 15 q21 days  [id number]:Doxorubicin + Cyclophosphamide (AC):   A cycle is every 21 days:     Doxorubicin (Adriamycin(R)) 60 mg/m2 IV Push followed by Dose Mod: None     Cyclophosphamide (Cytoxan(R)) 600 mg/m2 in 250 mL NS IV over 30 minutes Dose Mod: None  **Always confirm dose/schedule in your pharmacy ordering system**    OFF02621:Nab-Paclitaxel (Abraxane(R)) 100 mg/m2 Days 1, 8, and 15 q21 days:   A cycle is 21 days:     Nab-paclitaxel (protein bound) (Abraxane(R)) 100 mg/m2 reconstituted per manufacturer guidelines, administered IV over 30 minutes once weekly on days 1, 8, and 15, q21 days Dose Mod: None Additional Orders: **NOTE: May consider adding premeds/supportive orders as appropriate per institutional protocol**  Refs (in combination): Socinski et al. Annals of Oncology. 2013; 24:314 & 2390.; Lorn Junes et al. Clin Lung Cancer. 0340;35:248.; Tawanna Solo al. Holley [facility]che Oncol. 2014;9:83. [custom]  **Always confirm dose/schedule in your pharmacy ordering system**    Patient Characteristics: Neoadjuvant Chemotherapy, HER2/neu Negative/Unknown/Equivocal, ER Positive AJCC Stage Grouping: IIA Current Disease Status: No Distant Mets or Local Recurrence AJCC M Stage: 0 ER Status: Positive (+) AJCC N Stage: 0 AJCC T Stage: 2 HER2/neu: Negative (-) PR Status: Negative (-)  Intent of Therapy: Curative Intent, Discussed with Patient

## 2016-08-05 NOTE — Progress Notes (Signed)
Nutrition Assessment  Reason for Assessment:  Pt seen in Breast Clinic  ASSESSMENT:   66 year old female with new diagnosis of breast cancer. Past medical history reviewed.  Medications:  reviewed  Labs: reviewed  Anthropometrics:   Height: not taken Weight: 171 lb   NUTRITION DIAGNOSIS: Food and nutrition related knowledge deficit related to new diagnosis of breast cancer as evidenced by no prior need for nutrition related information.  INTERVENTION:   Discussed and provided packet of information regarding nutritional tips for breast cancer patients.  Questions answered.  Teachback method used.      MONITORING, EVALUATION, and GOAL: Pt will consume a healthy plant based diet to maintain lean body mass throughout treatment.   Casey Olson B. Zenia Resides, [place], [place] (pager)

## 2016-08-05 NOTE — Assessment & Plan Note (Signed)
07/24/2016: Palpable right breast mass for 2 months UOQ at 10:00: 4.7 x 4.1 x 3.7 cm with enlarged axillary lymph node biopsy benign, breast mass biopsy grade 3 IDC ER 30% week PR negative HER-2 negative Ki-67 90%  Pathology and radiology counseling: Discussed with the patient, the details of pathology including the type of breast cancer,the clinical staging, the significance of ER, PR and HER-2/neu receptors and the implications for treatment. After reviewing the pathology in detail, we proceeded to discuss the different treatment options between surgery, radiation, chemotherapy, antiestrogen therapies.  Recommendation based on multidisciplinary tumor board: 1. Neoadjuvant chemotherapy with Adriamycin and Cytoxan dose dense 4 followed by Abraxane weekly 12 2. Followed by breast conserving surgery with sentinel lymph node study vs mastectomy 3. Followed by adjuvant radiation therapy 4. Followed by adjuvant antiestrogen therapy  Chemotherapy Counseling: I discussed the risks and benefits of chemotherapy including the risks of nausea/ vomiting, risk of infection from low WBC count, fatigue due to chemo or anemia, bruising or bleeding due to low platelets, mouth sores, loss/ change in taste and decreased appetite. Liver and kidney function will be monitored through out chemotherapy as abnormalities in liver and kidney function may be a side effect of treatment. Cardiac dysfunction due to Adriamycin was discussed in detail. Risk of permanent bone marrow dysfunction due to chemo were also discussed.  Plan: 1. Port placement 2. Echocardiogram 3. Chemotherapy class 4. Breast MRI 5. Genetic counseling will also be arranged  PREVENT trial: [id number]  Newly diagnosed breast cancer Stages 1-3 scheduled to receive anthracycline randomized to atorvastatin 40 mg or placebo once daily for 24 months along with 3 cardiac MRIs or 2 years paid for by the trial. I discussed the risks and benefits of  atorvastatin including the risk of myopathy and elevation of liver function tests. I explained the randomization process and patient is interested in the trial. I provided her with literature to read and provided her with the contact with the clinical trials nurse to ask any questions regarding the trial.  Return to clinic in 2 weeks to start chemotherapy.  

## 2016-08-05 NOTE — Progress Notes (Signed)
Clinical Social Work [place] Psychosocial Distress Screening [address]  Patient completed distress screening protocol and scored a 1 on the Psychosocial Distress Thermometer which indicates mild distress. Clinical Social Worker met with patient and patients family in [facility] to assess for distress and other psychosocial needs. Patient stated she was feeling overwhelmed but felt "better" after meeting with the treatment team and getting more information on her treatment plan. CSW and patient discussed common feeling and emotions when being diagnosed with cancer, and the importance of support during treatment. CSW informed patient of the support team and support services at [facility]. CSW provided contact information and encouraged patient to call with any questions or concerns.  ONCBCN DISTRESS SCREENING 08/05/2016  Screening Type Initial Screening  Distress experienced in past week (1-10) 1     Johnnye Lana, MSW, LCSW, OSW-C Clinical Social Worker [place] [phone]

## 2016-08-06 ENCOUNTER — Ambulatory Visit: Payer: Self-pay | Admitting: General Surgery

## 2016-08-06 ENCOUNTER — Other Ambulatory Visit: Payer: Self-pay | Admitting: Hematology and Oncology

## 2016-08-06 ENCOUNTER — Telehealth: Payer: Self-pay | Admitting: *Deleted

## 2016-08-06 ENCOUNTER — Inpatient Hospital Stay: Admission: RE | Admit: 2016-08-06 | Payer: Medicare Other | Source: Ambulatory Visit

## 2016-08-06 DIAGNOSIS — Z170 Estrogen receptor positive status [ER+]: Principal | ICD-10-CM

## 2016-08-06 DIAGNOSIS — C50411 Malignant neoplasm of upper-outer quadrant of right female breast: Secondary | ICD-10-CM

## 2016-08-06 NOTE — Telephone Encounter (Signed)
Pt called to relate she has decided to proceed with sx 1st instead of neoadjuvant chemo. Informed pt that I will call and cancel her appt for breast MRI today as well as notify her care team. Denies questions or concerns regarding her dx or treatment care plan. Encourage pt to call with needs. Received verbal understanding.

## 2016-08-11 ENCOUNTER — Telehealth: Payer: Self-pay | Admitting: *Deleted

## 2016-08-11 ENCOUNTER — Telehealth: Payer: Self-pay | Admitting: Hematology and Oncology

## 2016-08-11 NOTE — Telephone Encounter (Signed)
Received call from patient questioning when her surgery will be because she has not heard anything.  She was confused and thought if she has surgery first she would not need chemo.  Informed her she would need chemo after surgery and the other appointments for echo and chemo class.  She will wait till after surgery for echo and chemo class.

## 2016-08-11 NOTE — Telephone Encounter (Signed)
SW PT TO CONFIRM CHEMO CLASS 2/7 @ 10 AM.

## 2016-08-18 ENCOUNTER — Telehealth: Payer: Self-pay | Admitting: Hematology and Oncology

## 2016-08-18 NOTE — Telephone Encounter (Signed)
Left message re 1/31 f/u. Schedule mailed.

## 2016-08-21 ENCOUNTER — Encounter (HOSPITAL_COMMUNITY)
Admission: RE | Admit: 2016-08-21 | Discharge: 2016-08-21 | Disposition: A | Discharge status: Home / Self Care | Payer: Medicare Other | Source: Ambulatory Visit | Attending: General Surgery | Admitting: General Surgery

## 2016-08-21 ENCOUNTER — Other Ambulatory Visit: Payer: Self-pay

## 2016-08-21 ENCOUNTER — Encounter (HOSPITAL_COMMUNITY): Payer: Self-pay

## 2016-08-21 DIAGNOSIS — Z01812 Encounter for preprocedural laboratory examination: Secondary | ICD-10-CM | POA: Insufficient documentation

## 2016-08-21 DIAGNOSIS — R9431 Abnormal electrocardiogram [ECG] [EKG]: Secondary | ICD-10-CM | POA: Diagnosis not present

## 2016-08-21 DIAGNOSIS — C50911 Malignant neoplasm of unspecified site of right female breast: Secondary | ICD-10-CM | POA: Insufficient documentation

## 2016-08-21 DIAGNOSIS — Z01818 Encounter for other preprocedural examination: Secondary | ICD-10-CM | POA: Insufficient documentation

## 2016-08-21 HISTORY — DX: Gastro-esophageal reflux disease without esophagitis: K21.9

## 2016-08-21 HISTORY — DX: Pure hypercholesterolemia, unspecified: E78.00

## 2016-08-21 HISTORY — DX: Bronchitis, not specified as acute or chronic: J40

## 2016-08-21 HISTORY — DX: Malignant (primary) neoplasm, unspecified: C80.1

## 2016-08-21 LAB — CBC
HCT: 38.1 % (ref 36.0–46.0)
Hemoglobin: 12.5 g/dL (ref 12.0–15.0)
MCH: 27.4 pg (ref 26.0–34.0)
MCHC: 32.8 g/dL (ref 30.0–36.0)
MCV: 83.6 fL (ref 78.0–100.0)
Platelets: 302 10*3/uL (ref 150–400)
RBC: 4.56 MIL/uL (ref 3.87–5.11)
RDW: 13 % (ref 11.5–15.5)
WBC: 9 10*3/uL (ref 4.0–10.5)

## 2016-08-21 LAB — BASIC METABOLIC PANEL
ANION GAP: 12 (ref 5–15)
BUN: 9 mg/dL (ref 6–20)
CALCIUM: 9.7 mg/dL (ref 8.9–10.3)
CO2: 23 mmol/L (ref 22–32)
Chloride: 101 mmol/L (ref 101–111)
Creatinine, Ser: 0.59 mg/dL (ref 0.44–1.00)
GFR calc non Af Amer: >60 mL/min (ref >60)
GLUCOSE: 266 mg/dL — AB (ref 65–99)
Potassium: 3.7 mmol/L (ref 3.5–5.1)
Sodium: 136 mmol/L (ref 135–145)

## 2016-08-21 LAB — GLUCOSE, CAPILLARY: Glucose-Capillary: 276 mg/dL — ABNORMAL HIGH (ref 65–99)

## 2016-08-21 NOTE — Progress Notes (Signed)
PCP - Nolene Ebbs Cardiologist - denies  Chest x-ray - not needed EKG - 08/21/16 Stress Test - denies ECHO - will have a pre chemo echo in Feb Cardiac Cath - denies Sleep Study - denies   Fasting Blood Sugar -120  Checks Blood Sugar __2___ times a day    Patient denies shortness of breath, fever, cough and chest pain at PAT appointment   Patient verbalized understanding of instructions that was given to them at the PAT appointment. Patient expressed that there were no further questions.  Patient was also instructed that they will need to review over the PAT instructions again at home before the surgery.

## 2016-08-21 NOTE — Pre-Procedure Instructions (Signed)
Casey Olson  08/21/2016      [id number] [address] [place], [place] [phone] [address] [place] [address] Phone: [phone] Fax: [phone]    Your procedure is scheduled on January 24  Report to [place] at [place].M.  Call this number if you have problems the morning of surgery:  [phone]   Remember:  Do not eat food or drink liquids after midnight.   Take these medicines the morning of surgery with A SIP OF WATER acetaminophen (TYLENOL), eye drops,    WHAT DO I DO ABOUT MY DIABETES MEDICATION?   Marland Kitchen Do not take oral diabetes medicines (pills) the morning of surgery. METFORMIN     . HE MORNING OF SURGERY, take ___10__________ units of ____Lanuts______insulin.  7 days prior to surgery STOP taking any Aspirin, Aleve, Naproxen, Ibuprofen, Motrin, Advil, Goody's, BC's, all herbal medications, fish oil, and all vitamins   How to Manage Your Diabetes Before and After Surgery  Why is it important to control my blood sugar before and after surgery? . Improving blood sugar levels before and after surgery helps healing and can limit problems. . A way of improving blood sugar control is eating a healthy diet by: o  Eating less sugar and carbohydrates o  Increasing activity/exercise o  Talking with your doctor about reaching your blood sugar goals . High blood sugars (greater than 180 mg/dL) can raise your risk of infections and slow your recovery, so you will need to focus on controlling your diabetes during the weeks before surgery. . Make sure that the doctor who takes care of your diabetes knows about your planned surgery including the date and location.  How do I manage my blood sugar before surgery? . Check your blood sugar at least 4 times a day, starting 2 days before surgery, to make sure that the level is not too high or low. o Check your blood sugar the morning of your surgery when  you wake up and every 2 hours until you get to the Short Stay unit. . If your blood sugar is less than 70 mg/dL, you will need to treat for low blood sugar: o Do not take insulin. o Treat a low blood sugar (less than 70 mg/dL) with  cup of clear juice (cranberry or apple), 4 glucose tablets, OR glucose gel. o Recheck blood sugar in 15 minutes after treatment (to make sure it is greater than 70 mg/dL). If your blood sugar is not greater than 70 mg/dL on recheck, call [phone] for further instructions. . Report your blood sugar to the short stay nurse when you get to Short Stay.  . If you are admitted to the hospital after surgery: o Your blood sugar will be checked by the staff and you will probably be given insulin after surgery (instead of oral diabetes medicines) to make sure you have good blood sugar levels. o The goal for blood sugar control after surgery is 80-180 mg/dL.    Do not wear jewelry, make-up or nail polish.  Do not wear lotions, powders, or perfumes, or deoderant.  Do not shave 48 hours prior to surgery.   Do not bring valuables to the hospital.  [facility] is not responsible for any belongings or valuables.  Contacts, dentures or bridgework may not be worn into surgery.  Leave your suitcase in the car.  After surgery it may be brought to your room.  For patients admitted to the hospital, discharge time will be determined by your treatment team.  Patients discharged the day of surgery will not be allowed to drive home.    Special instructions:   [place]- Preparing For Surgery  Before surgery, you can play an important role. Because skin is not sterile, your skin needs to be as free of germs as possible. You can reduce the number of germs on your skin by washing with CHG (chlorahexidine gluconate) Soap before surgery.  CHG is an antiseptic cleaner which kills germs and bonds with the skin to continue killing germs even after washing.  Please do not use if you  have an allergy to CHG or antibacterial soaps. If your skin becomes reddened/irritated stop using the CHG.  Do not shave (including legs and underarms) for at least 48 hours prior to first CHG shower. It is OK to shave your face.  Please follow these instructions carefully.   1. Shower the NIGHT BEFORE SURGERY and the MORNING OF SURGERY with CHG.   2. If you chose to wash your hair, wash your hair first as usual with your normal shampoo.  3. After you shampoo, rinse your hair and body thoroughly to remove the shampoo.  4. Use CHG as you would any other liquid soap. You can apply CHG directly to the skin and wash gently with a scrungie or a clean washcloth.   5. Apply the CHG Soap to your body ONLY FROM THE NECK DOWN.  Do not use on open wounds or open sores. Avoid contact with your eyes, ears, mouth and genitals (private parts). Wash genitals (private parts) with your normal soap.  6. Wash thoroughly, paying special attention to the area where your surgery will be performed.  7. Thoroughly rinse your body with warm water from the neck down.  8. DO NOT shower/wash with your normal soap after using and rinsing off the CHG Soap.  9. Pat yourself dry with a CLEAN TOWEL.   10. Wear CLEAN PAJAMAS   11. Place CLEAN SHEETS on your bed the night of your first shower and DO NOT SLEEP WITH PETS.    Day of Surgery: Do not apply any deodorants/lotions. Please wear clean clothes to the hospital/surgery center.      Please read over the following fact sheets that you were given.

## 2016-08-22 LAB — HEMOGLOBIN A1C
Hgb A1c MFr Bld: 8.3 % — ABNORMAL HIGH (ref 4.8–5.6)
Mean Plasma Glucose: 192 mg/dL

## 2016-08-24 NOTE — Progress Notes (Signed)
Anesthesia Chart Review: Patient is a 66 year old female scheduled for right mastectomy and sentinel mode mapping an Port placement on 08/26/16 by Dr. Marlou Starks.  History includes non-smoker, HTN, DM2, bronchitis, GERD, right breast cancer, hypercholesterolemia, hysterectomy. She denied SOB, fever, cough, chest pain at PAT.   BP (!) 152/69   Pulse 80   Temp 37.2 C (Oral)   Resp 20   SpO2 99%   PCP is Dr. Nolene Ebbs. HEM-ONC is Dr. Nicholas Lose. She does not see a cardiologist. She has a pre-chemo echo scheduled for 09/16/16.   Meds include aspirin 81 mg, Lantus, losartan, metformin, Zocor.  BP (!) 152/69   Pulse 80   Temp 37.2 C (Oral)   Resp 20   SpO2 99%   EKG 08/21/16: NSR, possible LAE, LAD, cannot rule out inferior infarct (age undetermined).  Currently there are no comparison EKG tracings; however, Dr. Jeanie Cooks has tracing from 06/12/16 that they will fax prior to her surgery date.    Preoperative labs noted. A1c 8.3, consistent with mean plasma glucose of 192.   I will plan to follow-up once comparison tracing received, based on currently available records I anticipate that she can proceed as planned if she remains asymptomatic from a CV standpoint. Dicussed with anesthesiologist Dr. Ermalene Postin.  George Hugh [facility] Short Stay Center/Anesthesiology Phone [phone] 08/24/2016 2:35 PM

## 2016-08-25 MED ORDER — CELECOXIB 200 MG PO CAPS
400.0000 mg | ORAL_CAPSULE | ORAL | Status: AC
  Administered 2016-08-26: 400 mg via ORAL
  Filled 2016-08-25: qty 2

## 2016-08-25 MED ORDER — ACETAMINOPHEN 500 MG PO TABS
1000.0000 mg | ORAL_TABLET | ORAL | Status: AC
  Administered 2016-08-26: 1000 mg via ORAL
  Filled 2016-08-25: qty 2

## 2016-08-25 MED ORDER — GABAPENTIN 300 MG PO CAPS
300.0000 mg | ORAL_CAPSULE | ORAL | Status: AC
  Administered 2016-08-26: 300 mg via ORAL
  Filled 2016-08-25: qty 1

## 2016-08-25 MED ORDER — CEFAZOLIN SODIUM-DEXTROSE 2-4 GM/100ML-% IV SOLN
2.0000 g | INTRAVENOUS | Status: AC
  Administered 2016-08-26: 2 g via INTRAVENOUS
  Filled 2016-08-25: qty 100

## 2016-08-25 NOTE — Anesthesia Preprocedure Evaluation (Signed)
Anesthesia Evaluation  Patient identified by MRN, date of birth, ID band Patient awake    Reviewed: Allergy & Precautions, H&P , Patient's Chart, lab work & pertinent test results, reviewed documented beta blocker date and time   Airway Mallampati: II  TM Distance: >3 FB Neck ROM: full    Dental no notable dental hx.    Pulmonary    Pulmonary exam normal breath sounds clear to auscultation       Cardiovascular hypertension,  Rhythm:regular Rate:Normal     Neuro/Psych    GI/Hepatic   Endo/Other  diabetes  Renal/GU      Musculoskeletal   Abdominal   Peds  Hematology   Anesthesia Other Findings   Reproductive/Obstetrics                             Anesthesia Physical Anesthesia Plan  ASA: III  Anesthesia Plan: General   Post-op Pain Management:  Regional for Post-op pain   Induction: Intravenous  Airway Management Planned: LMA  Additional Equipment:   Intra-op Plan:   Post-operative Plan:   Informed Consent: I have reviewed the patients History and Physical, chart, labs and discussed the procedure including the risks, benefits and alternatives for the proposed anesthesia with the patient or authorized representative who has indicated his/her understanding and acceptance.   Dental Advisory Given and Dental advisory given  Plan Discussed with: CRNA and Surgeon  Anesthesia Plan Comments: (Discussed GA with LMA, possible sore throat, potential need to switch to ETT, N/V, pulmonary aspiration. Questions answered. )        Anesthesia Quick Evaluation

## 2016-08-26 ENCOUNTER — Encounter (HOSPITAL_COMMUNITY)
Admission: RE | Disposition: A | Discharge status: Home / Self Care | Payer: Self-pay | Source: Ambulatory Visit | Attending: General Surgery

## 2016-08-26 ENCOUNTER — Ambulatory Visit (HOSPITAL_COMMUNITY): Payer: Medicare Other

## 2016-08-26 ENCOUNTER — Encounter (HOSPITAL_COMMUNITY)
Admission: RE | Admit: 2016-08-26 | Discharge: 2016-08-26 | Disposition: A | Discharge status: Home / Self Care | Payer: Medicare Other | Source: Ambulatory Visit | Attending: General Surgery | Admitting: General Surgery

## 2016-08-26 ENCOUNTER — Ambulatory Visit (HOSPITAL_COMMUNITY): Payer: Medicare Other | Admitting: Vascular Surgery

## 2016-08-26 ENCOUNTER — Ambulatory Visit (HOSPITAL_COMMUNITY): Payer: Medicare Other | Admitting: Certified Registered Nurse Anesthetist

## 2016-08-26 ENCOUNTER — Ambulatory Visit (HOSPITAL_COMMUNITY)
Admission: RE | Admit: 2016-08-26 | Discharge: 2016-08-28 | Disposition: A | Discharge status: Home / Self Care | Payer: Medicare Other | Source: Ambulatory Visit | Attending: General Surgery | Admitting: General Surgery

## 2016-08-26 ENCOUNTER — Encounter (HOSPITAL_COMMUNITY): Payer: Self-pay

## 2016-08-26 DIAGNOSIS — C50411 Malignant neoplasm of upper-outer quadrant of right female breast: Secondary | ICD-10-CM

## 2016-08-26 DIAGNOSIS — Z794 Long term (current) use of insulin: Secondary | ICD-10-CM | POA: Diagnosis not present

## 2016-08-26 DIAGNOSIS — C773 Secondary and unspecified malignant neoplasm of axilla and upper limb lymph nodes: Secondary | ICD-10-CM | POA: Diagnosis not present

## 2016-08-26 DIAGNOSIS — Z90710 Acquired absence of both cervix and uterus: Secondary | ICD-10-CM | POA: Insufficient documentation

## 2016-08-26 DIAGNOSIS — I10 Essential (primary) hypertension: Secondary | ICD-10-CM | POA: Diagnosis not present

## 2016-08-26 DIAGNOSIS — Z9079 Acquired absence of other genital organ(s): Secondary | ICD-10-CM | POA: Diagnosis not present

## 2016-08-26 DIAGNOSIS — Z803 Family history of malignant neoplasm of breast: Secondary | ICD-10-CM | POA: Insufficient documentation

## 2016-08-26 DIAGNOSIS — E119 Type 2 diabetes mellitus without complications: Secondary | ICD-10-CM | POA: Diagnosis not present

## 2016-08-26 DIAGNOSIS — R42 Dizziness and giddiness: Secondary | ICD-10-CM | POA: Insufficient documentation

## 2016-08-26 DIAGNOSIS — Z7982 Long term (current) use of aspirin: Secondary | ICD-10-CM | POA: Insufficient documentation

## 2016-08-26 DIAGNOSIS — E7800 Pure hypercholesterolemia, unspecified: Secondary | ICD-10-CM | POA: Diagnosis not present

## 2016-08-26 DIAGNOSIS — Z170 Estrogen receptor positive status [ER+]: Principal | ICD-10-CM

## 2016-08-26 DIAGNOSIS — Z79899 Other long term (current) drug therapy: Secondary | ICD-10-CM | POA: Diagnosis not present

## 2016-08-26 DIAGNOSIS — Z95828 Presence of other vascular implants and grafts: Secondary | ICD-10-CM

## 2016-08-26 HISTORY — PX: MASTECTOMY W/ SENTINEL NODE BIOPSY: SHX2001

## 2016-08-26 HISTORY — PX: PORTACATH PLACEMENT: SHX2246

## 2016-08-26 LAB — GLUCOSE, CAPILLARY
GLUCOSE-CAPILLARY: 205 mg/dL — AB (ref 65–99)
GLUCOSE-CAPILLARY: 197 mg/dL — AB (ref 65–99)
GLUCOSE-CAPILLARY: 181 mg/dL — AB (ref 65–99)
Glucose-Capillary: 193 mg/dL — ABNORMAL HIGH (ref 65–99)

## 2016-08-26 SURGERY — MASTECTOMY WITH SENTINEL LYMPH NODE BIOPSY
Anesthesia: Regional | Site: Breast | Laterality: Right

## 2016-08-26 MED ORDER — ROCURONIUM BROMIDE 50 MG/5ML IV SOSY
PREFILLED_SYRINGE | INTRAVENOUS | Status: AC
  Filled 2016-08-26: qty 5

## 2016-08-26 MED ORDER — MIDAZOLAM HCL 5 MG/5ML IJ SOLN
INTRAMUSCULAR | Status: DC | PRN
  Administered 2016-08-26: 2 mg via INTRAVENOUS

## 2016-08-26 MED ORDER — ONDANSETRON HCL 4 MG/2ML IJ SOLN: 4.0000 mg | Freq: Four times a day (QID) | INTRAMUSCULAR | Status: DC | PRN

## 2016-08-26 MED ORDER — LIDOCAINE HCL (CARDIAC) 20 MG/ML IV SOLN
INTRAVENOUS | Status: DC | PRN
  Administered 2016-08-26: 100 mg via INTRAVENOUS

## 2016-08-26 MED ORDER — LACTATED RINGERS IV SOLN
INTRAVENOUS | Status: DC | PRN
  Administered 2016-08-26 (×2): via INTRAVENOUS

## 2016-08-26 MED ORDER — ROCURONIUM BROMIDE 100 MG/10ML IV SOLN
INTRAVENOUS | Status: DC | PRN
  Administered 2016-08-26: 50 mg via INTRAVENOUS

## 2016-08-26 MED ORDER — METFORMIN HCL 500 MG PO TABS
1000.0000 mg | ORAL_TABLET | Freq: Two times a day (BID) | ORAL | Status: DC
  Administered 2016-08-26 – 2016-08-28 (×4): 1000 mg via ORAL
  Filled 2016-08-26 (×4): qty 2

## 2016-08-26 MED ORDER — HEPARIN SOD (PORK) LOCK FLUSH 100 UNIT/ML IV SOLN
INTRAVENOUS | Status: DC | PRN
  Administered 2016-08-26: 500 [IU] via INTRAVENOUS

## 2016-08-26 MED ORDER — FENTANYL CITRATE (PF) 100 MCG/2ML IJ SOLN
25.0000 ug | INTRAMUSCULAR | Status: DC | PRN
  Administered 2016-08-26 (×3): 50 ug via INTRAVENOUS

## 2016-08-26 MED ORDER — ACETAMINOPHEN 325 MG PO TABS: 650.0000 mg | ORAL_TABLET | Freq: Four times a day (QID) | ORAL | Status: DC | PRN

## 2016-08-26 MED ORDER — SIMVASTATIN 10 MG PO TABS
10.0000 mg | ORAL_TABLET | Freq: Every day | ORAL | Status: DC
  Administered 2016-08-26 – 2016-08-27 (×2): 10 mg via ORAL
  Filled 2016-08-26 (×2): qty 1

## 2016-08-26 MED ORDER — METHOCARBAMOL 500 MG PO TABS
500.0000 mg | ORAL_TABLET | Freq: Four times a day (QID) | ORAL | Status: DC | PRN
  Administered 2016-08-27 – 2016-08-28 (×2): 500 mg via ORAL
  Filled 2016-08-26 (×2): qty 1

## 2016-08-26 MED ORDER — PHENYLEPHRINE HCL 10 MG/ML IJ SOLN
INTRAMUSCULAR | Status: DC | PRN
  Administered 2016-08-26 (×2): 80 ug via INTRAVENOUS
  Administered 2016-08-26 (×2): 120 ug via INTRAVENOUS

## 2016-08-26 MED ORDER — METHYLENE BLUE 0.5 % INJ SOLN
INTRAVENOUS | Status: AC
  Filled 2016-08-26: qty 10

## 2016-08-26 MED ORDER — PANTOPRAZOLE SODIUM 40 MG PO TBEC
40.0000 mg | DELAYED_RELEASE_TABLET | Freq: Every day | ORAL | Status: DC
  Administered 2016-08-27: 40 mg via ORAL
  Filled 2016-08-26 (×2): qty 1

## 2016-08-26 MED ORDER — FENTANYL CITRATE (PF) 100 MCG/2ML IJ SOLN
INTRAMUSCULAR | Status: AC
  Filled 2016-08-26: qty 2

## 2016-08-26 MED ORDER — PHENOL 1.4 % MT LIQD
1.0000 | OROMUCOSAL | Status: DC | PRN
  Filled 2016-08-26: qty 177

## 2016-08-26 MED ORDER — TRAMADOL HCL 50 MG PO TABS
ORAL_TABLET | ORAL | Status: AC
  Administered 2016-08-26: 100 mg via ORAL
  Filled 2016-08-26: qty 2

## 2016-08-26 MED ORDER — BUPIVACAINE HCL (PF) 0.25 % IJ SOLN
INTRAMUSCULAR | Status: DC | PRN
  Administered 2016-08-26: 10 mL

## 2016-08-26 MED ORDER — SODIUM CHLORIDE 0.9 % IV SOLN
INTRAVENOUS | Status: DC | PRN
  Administered 2016-08-26: 09:00:00

## 2016-08-26 MED ORDER — FENTANYL CITRATE (PF) 100 MCG/2ML IJ SOLN
25.0000 ug | INTRAMUSCULAR | Status: DC | PRN
  Administered 2016-08-26 – 2016-08-27 (×4): 50 ug via INTRAVENOUS
  Filled 2016-08-26 (×4): qty 2

## 2016-08-26 MED ORDER — HEPARIN SODIUM (PORCINE) 5000 UNIT/ML IJ SOLN
5000.0000 [IU] | Freq: Three times a day (TID) | INTRAMUSCULAR | Status: DC
  Administered 2016-08-27 – 2016-08-28 (×3): 5000 [IU] via SUBCUTANEOUS
  Filled 2016-08-26 (×3): qty 1

## 2016-08-26 MED ORDER — FENTANYL CITRATE (PF) 100 MCG/2ML IJ SOLN
INTRAMUSCULAR | Status: AC
  Administered 2016-08-26: 50 ug via INTRAVENOUS
  Filled 2016-08-26: qty 2

## 2016-08-26 MED ORDER — PROPOFOL 10 MG/ML IV BOLUS
INTRAVENOUS | Status: DC | PRN
  Administered 2016-08-26: 150 mg via INTRAVENOUS

## 2016-08-26 MED ORDER — ASPIRIN EC 81 MG PO TBEC
81.0000 mg | DELAYED_RELEASE_TABLET | Freq: Every day | ORAL | Status: DC
  Administered 2016-08-27 – 2016-08-28 (×2): 81 mg via ORAL
  Filled 2016-08-26 (×2): qty 1

## 2016-08-26 MED ORDER — CHLORHEXIDINE GLUCONATE CLOTH 2 % EX PADS: 6.0000 | MEDICATED_PAD | Freq: Once | CUTANEOUS | Status: DC

## 2016-08-26 MED ORDER — PROPOFOL 10 MG/ML IV BOLUS
INTRAVENOUS | Status: AC
  Filled 2016-08-26: qty 20

## 2016-08-26 MED ORDER — LOSARTAN POTASSIUM 50 MG PO TABS
50.0000 mg | ORAL_TABLET | Freq: Every day | ORAL | Status: DC
  Administered 2016-08-27 – 2016-08-28 (×2): 50 mg via ORAL
  Filled 2016-08-26 (×2): qty 1

## 2016-08-26 MED ORDER — ALBUMIN HUMAN 5 % IV SOLN
INTRAVENOUS | Status: DC | PRN
  Administered 2016-08-26: 09:00:00 via INTRAVENOUS

## 2016-08-26 MED ORDER — ONDANSETRON 4 MG PO TBDP: 4.0000 mg | ORAL_TABLET | Freq: Four times a day (QID) | ORAL | Status: DC | PRN

## 2016-08-26 MED ORDER — MIDAZOLAM HCL 2 MG/2ML IJ SOLN
INTRAMUSCULAR | Status: AC
  Filled 2016-08-26: qty 2

## 2016-08-26 MED ORDER — TECHNETIUM TC 99M SULFUR COLLOID FILTERED
1.0000 | Freq: Once | INTRAVENOUS | Status: AC | PRN
  Administered 2016-08-26: 1 via INTRADERMAL

## 2016-08-26 MED ORDER — PHENYLEPHRINE HCL 10 MG/ML IJ SOLN
INTRAMUSCULAR | Status: DC | PRN
  Administered 2016-08-26: 25 ug/min via INTRAVENOUS

## 2016-08-26 MED ORDER — ONDANSETRON HCL 4 MG/2ML IJ SOLN
INTRAMUSCULAR | Status: DC | PRN
  Administered 2016-08-26: 4 mg via INTRAVENOUS

## 2016-08-26 MED ORDER — PANTOPRAZOLE SODIUM 40 MG IV SOLR: 40.0000 mg | Freq: Every day | INTRAVENOUS | Status: DC

## 2016-08-26 MED ORDER — 0.9 % SODIUM CHLORIDE (POUR BTL) OPTIME
TOPICAL | Status: DC | PRN
  Administered 2016-08-26 (×2): 1000 mL

## 2016-08-26 MED ORDER — SODIUM CHLORIDE 0.9 % IJ SOLN
INTRAMUSCULAR | Status: AC
  Filled 2016-08-26: qty 10

## 2016-08-26 MED ORDER — TRAMADOL HCL 50 MG PO TABS
50.0000 mg | ORAL_TABLET | Freq: Two times a day (BID) | ORAL | Status: DC | PRN
  Administered 2016-08-26 – 2016-08-27 (×2): 100 mg via ORAL
  Filled 2016-08-26 (×2): qty 2

## 2016-08-26 MED ORDER — SUGAMMADEX SODIUM 200 MG/2ML IV SOLN
INTRAVENOUS | Status: DC | PRN
Start: 2016-08-26 — End: 2016-08-26
  Administered 2016-08-26: 150 mg via INTRAVENOUS

## 2016-08-26 MED ORDER — BUPIVACAINE HCL (PF) 0.25 % IJ SOLN
INTRAMUSCULAR | Status: AC
  Filled 2016-08-26: qty 30

## 2016-08-26 MED ORDER — LIDOCAINE 2% (20 MG/ML) 5 ML SYRINGE
INTRAMUSCULAR | Status: AC
  Filled 2016-08-26: qty 5

## 2016-08-26 MED ORDER — FENTANYL CITRATE (PF) 100 MCG/2ML IJ SOLN
INTRAMUSCULAR | Status: DC | PRN
  Administered 2016-08-26 (×3): 25 ug via INTRAVENOUS
  Administered 2016-08-26 (×2): 50 ug via INTRAVENOUS
  Administered 2016-08-26: 100 ug via INTRAVENOUS
  Administered 2016-08-26: 25 ug via INTRAVENOUS
  Administered 2016-08-26: 50 ug via INTRAVENOUS

## 2016-08-26 MED ORDER — POTASSIUM CHLORIDE IN NACL 20-0.9 MEQ/L-% IV SOLN
INTRAVENOUS | Status: DC
  Administered 2016-08-26: 16:00:00 via INTRAVENOUS
  Filled 2016-08-26: qty 1000

## 2016-08-26 MED ORDER — HEPARIN SOD (PORK) LOCK FLUSH 100 UNIT/ML IV SOLN
INTRAVENOUS | Status: AC
  Filled 2016-08-26: qty 5

## 2016-08-26 SURGICAL SUPPLY — 62 items
APPLIER CLIP 9.375 MED OPEN (MISCELLANEOUS) ×8
BAG DECANTER FOR FLEXI CONT (MISCELLANEOUS) ×4 IMPLANT
BINDER BREAST LRG (GAUZE/BANDAGES/DRESSINGS) IMPLANT
BINDER BREAST XLRG (GAUZE/BANDAGES/DRESSINGS) ×4 IMPLANT
BIOPATCH RED 1 DISK 7.0 (GAUZE/BANDAGES/DRESSINGS) ×6 IMPLANT
BIOPATCH RED 1IN DISK 7.0MM (GAUZE/BANDAGES/DRESSINGS) ×2
BLADE SURG 15 STRL LF DISP TIS (BLADE) IMPLANT
BLADE SURG 15 STRL SS (BLADE)
CANISTER SUCTION 2500CC (MISCELLANEOUS) ×4 IMPLANT
CHLORAPREP W/TINT 10.5 ML (MISCELLANEOUS) IMPLANT
CHLORAPREP W/TINT 26ML (MISCELLANEOUS) ×4 IMPLANT
CLIP APPLIE 9.375 MED OPEN (MISCELLANEOUS) ×4 IMPLANT
CONT SPEC 4OZ CLIKSEAL STRL BL (MISCELLANEOUS) ×4 IMPLANT
COVER PROBE W GEL 5X96 (DRAPES) ×4 IMPLANT
COVER SURGICAL LIGHT HANDLE (MISCELLANEOUS) ×4 IMPLANT
CRADLE DONUT ADULT HEAD (MISCELLANEOUS) ×4 IMPLANT
DERMABOND ADVANCED (GAUZE/BANDAGES/DRESSINGS) ×2
DERMABOND ADVANCED .7 DNX12 (GAUZE/BANDAGES/DRESSINGS) ×2 IMPLANT
DEVICE DISSECT PLASMABLAD 3.0S (MISCELLANEOUS) ×2 IMPLANT
DRAIN CHANNEL 19F RND (DRAIN) ×8 IMPLANT
DRAPE C-ARM 42X72 X-RAY (DRAPES) ×4 IMPLANT
DRAPE CHEST BREAST 15X10 FENES (DRAPES) ×4 IMPLANT
DRSG TEGADERM 2-3/8X2-3/4 SM (GAUZE/BANDAGES/DRESSINGS) ×8 IMPLANT
ELECT CAUTERY BLADE 6.4 (BLADE) ×4 IMPLANT
ELECT REM PT RETURN 9FT ADLT (ELECTROSURGICAL) ×8
ELECTRODE REM PT RTRN 9FT ADLT (ELECTROSURGICAL) ×4 IMPLANT
EVACUATOR SILICONE 100CC (DRAIN) ×8 IMPLANT
GAUZE SPONGE 4X4 16PLY XRAY LF (GAUZE/BANDAGES/DRESSINGS) ×4 IMPLANT
GLOVE BIO SURGEON STRL SZ7.5 (GLOVE) ×12 IMPLANT
GLOVE INDICATOR 7.5 STRL GRN (GLOVE) ×4 IMPLANT
GOWN STRL REUS W/ TWL LRG LVL3 (GOWN DISPOSABLE) ×12 IMPLANT
GOWN STRL REUS W/TWL LRG LVL3 (GOWN DISPOSABLE) ×12
KIT BASIN OR (CUSTOM PROCEDURE TRAY) ×4 IMPLANT
KIT PORT POWER 8FR ISP CVUE (Catheter) ×4 IMPLANT
KIT ROOM TURNOVER OR (KITS) ×4 IMPLANT
LIGHT WAVEGUIDE WIDE FLAT (MISCELLANEOUS) IMPLANT
NEEDLE 18GX1X1/2 (RX/OR ONLY) (NEEDLE) IMPLANT
NEEDLE 22X1 1/2 (OR ONLY) (NEEDLE) IMPLANT
NEEDLE HYPO 25GX1X1/2 BEV (NEEDLE) ×4 IMPLANT
NS IRRIG 1000ML POUR BTL (IV SOLUTION) ×4 IMPLANT
PACK GENERAL/GYN (CUSTOM PROCEDURE TRAY) ×4 IMPLANT
PAD ARMBOARD 7.5X6 YLW CONV (MISCELLANEOUS) ×4 IMPLANT
PLASMABLADE 3.0S (MISCELLANEOUS) ×4
SPECIMEN JAR X LARGE (MISCELLANEOUS) ×4 IMPLANT
SUT ETHILON 3 0 FSL (SUTURE) ×4 IMPLANT
SUT MNCRL AB 4-0 PS2 18 (SUTURE) ×12 IMPLANT
SUT PROLENE 2 0 SH 30 (SUTURE) ×8 IMPLANT
SUT SILK 2 0 (SUTURE)
SUT SILK 2 0 FS (SUTURE) ×4 IMPLANT
SUT SILK 2-0 18XBRD TIE 12 (SUTURE) IMPLANT
SUT VIC AB 3-0 54X BRD REEL (SUTURE) IMPLANT
SUT VIC AB 3-0 BRD 54 (SUTURE)
SUT VIC AB 3-0 SH 18 (SUTURE) ×4 IMPLANT
SUT VIC AB 3-0 SH 27 (SUTURE)
SUT VIC AB 3-0 SH 27XBRD (SUTURE) IMPLANT
SYR 20ML ECCENTRIC (SYRINGE) ×8 IMPLANT
SYR 5ML LUER SLIP (SYRINGE) ×4 IMPLANT
SYR CONTROL 10ML LL (SYRINGE) ×4 IMPLANT
TOWEL OR 17X24 6PK STRL BLUE (TOWEL DISPOSABLE) IMPLANT
TOWEL OR 17X26 10 PK STRL BLUE (TOWEL DISPOSABLE) ×4 IMPLANT
TUBE CONNECTING 12'X1/4 (SUCTIONS) ×1
TUBE CONNECTING 12X1/4 (SUCTIONS) ×3 IMPLANT

## 2016-08-26 NOTE — Anesthesia Procedure Notes (Signed)
Procedure Name: Intubation Date/Time: 08/26/2016 8:45 AM Performed by: Shirlyn Goltz Pre-anesthesia Checklist: Patient identified, Emergency Drugs available, Suction available and Patient being monitored Patient Re-evaluated:Patient Re-evaluated prior to inductionOxygen Delivery Method: Circle system utilized Preoxygenation: Pre-oxygenation with 100% oxygen Intubation Type: IV induction Ventilation: Mask ventilation without difficulty Laryngoscope Size: Mac and 3 Grade View: Grade II Tube type: Oral Tube size: 7.0 mm Number of attempts: 1 Airway Equipment and Method: Stylet Placement Confirmation: ETT inserted through vocal cords under direct vision,  positive ETCO2 and breath sounds checked- equal and bilateral Secured at: 22 cm Tube secured with: Tape Dental Injury: Teeth and Oropharynx as per pre-operative assessment

## 2016-08-26 NOTE — Progress Notes (Signed)
Notified MD about blood sugar of 193. No SSI orders at this time.

## 2016-08-26 NOTE — Anesthesia Procedure Notes (Signed)
Anesthesia Regional Block:  Pectoralis block  Pre-Anesthetic Checklist: ,, timeout performed, Correct Patient, Correct Site, Correct Laterality, Correct Procedure, Correct Position, site marked, Risks and benefits discussed, pre-op evaluation,  At surgeon's request and post-op pain management  Laterality: Right  Prep: chloraprep       Needles:   Needle Type: Echogenic Needle     Needle Length: 9cm 9 cm Needle Gauge: 21 and 21 G    Additional Needles:  Procedures: ultrasound guided (picture in chart) Pectoralis block Narrative:  Start time: 08/26/2016 8:13 AM End time: 08/26/2016 8:20 AM Injection made incrementally with aspirations every 5 mL. Anesthesiologist: Lyndle Herrlich  Additional Notes: 25cc .5% Marcaine

## 2016-08-26 NOTE — H&P (Signed)
Casey Olson  Location: [facility] Patient #: [id number] DOB: 05/17/1951 Undefined / Language: Casey Olson / Race: Black or African American Female   History of Present Illness  The patient is a 66 year old female who presents with breast cancer. We are asked to see the patient in consultation by Dr. Lisbeth Renshaw to evaluate her for a new right breast cancer. The patient is a 66 yo black female who recently had a mammogram after not getting one last year. She felt a mass in the upper outer right breast about 2 months ago. It has been painful. She denies any discharge from the nipple. The mass measured 4.7cm. It was ER + weakly and PR- and her2 - with a Ki67 of 90%. She denies any hormone replacement.   Past Surgical History  Breast Biopsy  Right. Colon Polyp Removal - Colonoscopy  Hysterectomy (not due to cancer) - Complete  Oral Surgery   Diagnostic Studies History  Colonoscopy  5-10 years ago Mammogram  within last year Pap Smear  1-5 years ago  Medication History  Medications Reconciled  Social History  Alcohol use  Occasional alcohol use. No caffeine use  No drug use  Tobacco use  Never smoker.  Family History  Arthritis  Sister. Bleeding disorder  Family Members In General, Sister. Breast Cancer  Family Members In General. Cerebrovascular Accident  Daughter. Heart Disease  Sister. Malignant Neoplasm Of Pancreas  Sister.  Pregnancy / Birth History  Age at menarche  58 years. Age of menopause  <45 Contraceptive History  Intrauterine device, Oral contraceptives. Gravida  2 Maternal age  65-25 Para  2  Other Problems  Back Pain  Breast Cancer  Diabetes Mellitus  Gastroesophageal Reflux Disease  High blood pressure  Hypercholesterolemia  Lump In Breast  Oophorectomy     Review of Systems  General Not Present- Appetite Loss, Chills, Fatigue, Fever, Night Sweats, Weight Gain and Weight Loss. Skin Present- Dryness. Not  Present- Change in Wart/Mole, Hives, Jaundice, New Lesions, Non-Healing Wounds, Rash and Ulcer. HEENT Present- Wears glasses/contact lenses. Not Present- Earache, Hearing Loss, Hoarseness, Nose Bleed, Oral Ulcers, Ringing in the Ears, Seasonal Allergies, Sinus Pain, Sore Throat, Visual Disturbances and Yellow Eyes. Respiratory Present- Snoring. Not Present- Bloody sputum, Chronic Cough, Difficulty Breathing and Wheezing. Breast Present- Breast Mass and Breast Pain. Not Present- Nipple Discharge and Skin Changes. Cardiovascular Present- Chest Pain and Leg Cramps. Not Present- Difficulty Breathing Lying Down, Palpitations, Rapid Heart Rate, Shortness of Breath and Swelling of Extremities. Gastrointestinal Present- Change in Bowel Habits and Indigestion. Not Present- Abdominal Pain, Bloating, Bloody Stool, Chronic diarrhea, Constipation, Difficulty Swallowing, Excessive gas, Gets full quickly at meals, Hemorrhoids, Nausea, Rectal Pain and Vomiting. Female Genitourinary Not Present- Frequency, Nocturia, Painful Urination, Pelvic Pain and Urgency. Musculoskeletal Present- Back Pain and Joint Stiffness. Not Present- Joint Pain, Muscle Pain, Muscle Weakness and Swelling of Extremities. Neurological Present- Tingling. Not Present- Decreased Memory, Fainting, Headaches, Numbness, Seizures, Tremor, Trouble walking and Weakness. Psychiatric Not Present- Anxiety, Bipolar, Change in Sleep Pattern, Depression, Fearful and Frequent crying. Endocrine Not Present- Cold Intolerance, Excessive Hunger, Hair Changes, Heat Intolerance, Hot flashes and New Diabetes. Hematology Present- Blood Thinners. Not Present- Easy Bruising, Excessive bleeding, Gland problems, HIV and Persistent Infections.   Physical Exam  General Mental Status-Alert. General Appearance-Consistent with stated age. Hydration-Well hydrated. Voice-Normal.  Head and Neck Head-normocephalic, atraumatic with no lesions or palpable  masses. Trachea-midline. Thyroid Gland Characteristics - normal size and consistency.  Eye Eyeball - Bilateral-Extraocular movements intact.  Sclera/Conjunctiva - Bilateral-No scleral icterus.  Chest and Lung Exam Chest and lung exam reveals -quiet, even and easy respiratory effort with no use of accessory muscles and on auscultation, normal breath sounds, no adventitious sounds and normal vocal resonance. Inspection Chest Wall - Normal. Back - normal.  Breast Note: There is a large 5cm palpable mass in the upper outer quadrant that is not fixed to the skin or chest wall. There is no palpable mass in the left breast. there is no palpable axillary, supraclavicular, or cervical lymphadenopathy   Cardiovascular Cardiovascular examination reveals -normal heart sounds, regular rate and rhythm with no murmurs and normal pedal pulses bilaterally.  Abdomen Inspection Inspection of the abdomen reveals - No Hernias. Skin - Scar - no surgical scars. Palpation/Percussion Palpation and Percussion of the abdomen reveal - Soft, Non Tender, No Rebound tenderness, No Rigidity (guarding) and No hepatosplenomegaly. Auscultation Auscultation of the abdomen reveals - Bowel sounds normal.  Neurologic Neurologic evaluation reveals -alert and oriented x 3 with no impairment of recent or remote memory. Mental Status-Normal.  Musculoskeletal Normal Exam - Left-Upper Extremity Strength Normal and Lower Extremity Strength Normal. Normal Exam - Right-Upper Extremity Strength Normal and Lower Extremity Strength Normal.  Lymphatic Head & Neck  General Head & Neck Lymphatics: Bilateral - Description - Normal. Axillary  General Axillary Region: Bilateral - Description - Normal. Tenderness - Non Tender. Femoral & Inguinal  Generalized Femoral & Inguinal Lymphatics: Bilateral - Description - Normal. Tenderness - Non Tender.    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 At this point I would favor mastectomy and sentinel node mapping if she does not get neoadjuvant therapy. I have discussed with her the risks and benefits of the surgery as well as some of the technical aspects including placing a port and she understands. she is going to get a second opinion and then let us know what she wants

## 2016-08-26 NOTE — Interval H&P Note (Signed)
History and Physical Interval Note:  08/26/2016 8:24 AM  Casey Olson  has presented today for surgery, with the diagnosis of RIGHT BREAST CANCER  The various methods of treatment have been discussed with the patient and family. After consideration of risks, benefits and other options for treatment, the patient has consented to  Procedure(s): RIGHT MASTECTOMY WITH SENTINEL LYMPH NODE BIOPSY (Right) INSERTION PORT-A-CATH WITH [place] (N/A) as a surgical intervention .  The patient's history has been reviewed, patient examined, no change in status, stable for surgery.  I have reviewed the patient's chart and labs.  Questions were answered to the patient's satisfaction.     TOTH III,Quintara Bost S

## 2016-08-26 NOTE — Anesthesia Postprocedure Evaluation (Signed)
Anesthesia Post Note  Patient: Casey Olson  Procedure(s) Performed: Procedure(s) (LRB): RIGHT MASTECTOMY WITH SENTINEL LYMPH NODE BIOPSY (Right) INSERTION PORT-A-CATH (Left)  Patient location during evaluation: PACU Anesthesia Type: Regional Level of consciousness: sedated Pain management: satisfactory to patient Vital Signs Assessment: post-procedure vital signs reviewed and stable Respiratory status: spontaneous breathing Cardiovascular status: stable Anesthetic complications: no       Last Vitals:  Vitals:   08/26/16 1445 08/26/16 1500  BP:    Pulse: 87 86  Resp: 18 (!) 23  Temp:      Last Pain:  Vitals:   08/26/16 1401  TempSrc:   PainSc: 3                  Kadra Kohan EDWARD

## 2016-08-26 NOTE — Op Note (Signed)
08/26/2016  11:06 AM  PATIENT:  Casey Olson  66 y.o. female  PRE-OPERATIVE DIAGNOSIS:  RIGHT BREAST CANCER  POST-OPERATIVE DIAGNOSIS:  RIGHT BREAST CANCER  PROCEDURE:  Procedure(s): RIGHT MASTECTOMY WITH SENTINEL LYMPH NODE BIOPSY (Right) and subsequent node dissection INSERTION PORT-A-CATH (Left)  SURGEON:  Surgeon(s) and Role:    * Jovita Kussmaul, MD - Primary  PHYSICIAN ASSISTANT:   ASSISTANTS: Sharyn Dross, RNFA   ANESTHESIA:   general  EBL:  Total I/O In: 1250 [I.V.:1000; IV Piggyback:250] Out: 100 [Blood:100]  BLOOD ADMINISTERED:none  DRAINS: (2) Jackson-Pratt drain(s) with closed bulb suction in the prepectoral space   LOCAL MEDICATIONS USED:  MARCAINE     SPECIMEN:  Source of Specimen:  right mastectomy with axillary contents  DISPOSITION OF SPECIMEN:  PATHOLOGY  COUNTS:  YES  TOURNIQUET:  * No tourniquets in log *  DICTATION: .Dragon Dictation   After informed consent was obtained the patient was brought to the operating room and placed in the supine position on the operating room table. After adequate induction of general anesthesia a roll was placed between the patient's shoulder blades to extend the shoulder slightly. Next the bilateral chest, breast, and axillary areas were prepped with ChloraPrep, allowed to dry, and draped in usual sterile manner. An appropriate timeout was performed. Attention was first turned to the left chest wall for placement of the Port-A-Cath. The patient was placed in Trendelenburg position a large bore needle from the Port-A-Cath kit was used to slide beneath the bend of the clavicle on the left chest wall heading towards the sternal notch and in doing so I was able to access the left subclavian vein without difficulty. A wire was fed through the needle using the Seldinger technique without difficulty. The wire was confirmed in the central venous system using real-time fluoroscopy. Extensive small incision was made at the wire  entry site with a 15 blade knife. The incision was carried through the skin and subcutaneous tissue sharply with electrocautery. A subcutaneous pocket was created inferior to the incision by blunt finger dissection. Next the tubing was placed on the reservoir. The reservoir was placed in the pocket and the length of the tubing was again estimated using real-time fluoroscopy. The tubing was cut to the appropriate length. Next the sheath and dilator were fed over the wire using the Seldinger technique without difficulty. The dilator and wire were removed from the patient. The tubing was then fed through the sheath as far as it would go and then held in place while the sheath was gently cracked and separated. Another real-time fluoroscopy image showed the tip of the catheter to be in the distal superior vena cava. The tubing was then permanently attached to the reservoir. The reservoir was anchored in the pocket with 2 2-0 Prolene stitches. The port was then aspirated and it aspirated blood easily. It was then flushed initially with a dilute heparin solution and then with a more concentrated heparin solution. The subcutaneous tissue was then closed over the port with interrupted 3-0 Vicryl stitches. The skin was then closed with a running 4-0 Monocryl subcuticular stitch. Attention was then turned to the right breast. Earlier in the day the patient underwent injection of 1 mCi of technetium sulfur colloid in the subareolar position on the right. There was a large palpable mass in the lateral aspect of the right breast. An elliptical incision was then made in the skin overlying the breast in order to minimize the excess skin. The incision was  carried through the skin and subcutaneous tannish tissue sharply with the plasma blade. Breast ducts were then used to elevate the skin flaps anteriorly towards the ceiling. Thin skin flaps were then created between the breast tissue and subcutaneous fat. This dissection was  carried out with the plasma blade until the dissection reached the chest wall. The breast was then removed from the pectoralis muscle with the pectoralis fascia. A small amount of chest wall pectoral muscle was removed with the tumor. The neoprobe was then used to identify radioactivity in the right axilla. There were several areas of increased radioactivity involving clumped lymph nodes. Because of this the removal of these lymph nodes basically encompassed a right axillary lymph node dissection of the deep axillary lymph nodes. The long thoracic and thoracic dorsal nerves were identified and spared along with the right axillary vein. Once the tissue was completely removed and the specimen was then oriented with a stitch on the lateral skin and sent to pathology for further evaluation. Hemostasis was achieved using the plasma blade. The wound was irrigated with saline. 2 small stab incisions were made near the anterior axillary line inferior to the operative bed with a 15 blade knife. A tonsil clamp was placed through each one of these openings and used to bring a 19 [place] round Blake drain into the operative bed. The lateral drain was placed in the axilla and the medial drain was probed along the chest wall. The drains were anchored to the skin with 3-0 nylon stitches. The skin flaps appeared healthy and viable. The superior and inferior flaps were then grossly reapproximated with interrupted 3-0 Vicryl stitches. The skin was then closed with a running 4-0 Monocryl subcuticular stitches. Dermabond dressings were applied. The patient tolerated the procedure well. At the end of the case all needle sponge and instrument counts were correct. The drains were placed to bulb suction and there was a good seal. The patient was then awakened and taken to recovery in stable condition.  PLAN OF CARE: Admit for overnight observation  PATIENT DISPOSITION:  PACU - hemodynamically stable.   Delay start of Pharmacological  VTE agent (>24hrs) due to surgical blood loss or risk of bleeding: no

## 2016-08-26 NOTE — Transfer of Care (Signed)
Immediate Anesthesia Transfer of Care Note  Patient: Casey Olson  Procedure(s) Performed: Procedure(s): RIGHT MASTECTOMY WITH SENTINEL LYMPH NODE BIOPSY (Right) INSERTION PORT-A-CATH (Left)  Patient Location: PACU  Anesthesia Type:General and Regional  Level of Consciousness: awake, alert , oriented and patient cooperative  Airway & Oxygen Therapy: Patient Spontanous Breathing and Patient connected to nasal cannula oxygen  Post-op Assessment: Report given to RN and Post -op Vital signs reviewed and stable  Post vital signs: Reviewed and stable  Last Vitals:  Vitals:   08/26/16 0721 08/26/16 1121  BP: (!) 163/56 (!) 145/76  Pulse: 97 87  Resp: 20 18  Temp: 36.9 C 36.7 C    Last Pain:  Vitals:   08/26/16 0721  TempSrc: Oral  PainSc: 5       Patients Stated Pain Goal: 3 ([id number])  Complications: No apparent anesthesia complications

## 2016-08-27 ENCOUNTER — Encounter (HOSPITAL_COMMUNITY): Payer: Self-pay | Admitting: General Surgery

## 2016-08-27 DIAGNOSIS — C50411 Malignant neoplasm of upper-outer quadrant of right female breast: Secondary | ICD-10-CM | POA: Diagnosis not present

## 2016-08-27 MED ORDER — GABAPENTIN 100 MG PO CAPS
100.0000 mg | ORAL_CAPSULE | Freq: Three times a day (TID) | ORAL | Status: DC
  Administered 2016-08-27 – 2016-08-28 (×4): 100 mg via ORAL
  Filled 2016-08-27 (×4): qty 1

## 2016-08-27 MED ORDER — TRAMADOL HCL 50 MG PO TABS
50.0000 mg | ORAL_TABLET | Freq: Four times a day (QID) | ORAL | Status: DC | PRN
  Administered 2016-08-27: 50 mg via ORAL
  Administered 2016-08-28 (×2): 100 mg via ORAL
  Filled 2016-08-27 (×3): qty 2

## 2016-08-27 NOTE — Progress Notes (Signed)
Inpatient Diabetes Program Recommendations  AACE/ADA: New Consensus Statement on Inpatient Glycemic Control (2015)  Target Ranges:  Prepandial:   less than 140 mg/dL      Peak postprandial:   less than 180 mg/dL (1-2 hours)      Critically ill patients:  140 - 180 mg/dL   Lab Results  Component Value Date   GLUCAP 181 (H) 08/26/2016   HGBA1C 8.3 (H) 08/21/2016    Review of Glycemic Control:  Results for BLAYRE, BUCHKO (MRN [id number]) as of 08/27/2016 10:13  Ref. Range 08/26/2016 07:27 08/26/2016 11:29 08/26/2016 16:25 08/26/2016 21:41  Glucose-Capillary Latest Ref Range: 65 - 99 mg/dL 205 (H) 197 (H) 193 (H) 181 (H)   Diabetes history: Type 2 diabetes Outpatient Diabetes medications: Metformin 1000 mg bid Current orders for Inpatient glycemic control:  none  Inpatient Diabetes Program Recommendations:    Please add Novolog moderate correction tid with meals and HS.    Thanks, Adah Perl, RN, BC-ADM Inpatient Diabetes Coordinator Pager [phone] (8a-5p)

## 2016-08-27 NOTE — Care Management Obs Status (Signed)
[place] NOTIFICATION   Patient Details  Name: Casey Olson MRN: [id number] Date of Birth: 1951-05-09   Medicare Observation Status Notification Given:       Marilu Favre, RN 08/27/2016, 2:09 PM

## 2016-08-27 NOTE — Progress Notes (Signed)
1 Day Post-Op  Subjective: Complains of pain and some dizziness this am  Objective: Vital signs in last 24 hours: Temp:  [98 F (36.7 C)-99.2 F (37.3 C)] 99.2 F (37.3 C) (01/25 0703) Pulse Rate:  [71-89] 71 (01/25 0703) Resp:  [13-23] 18 (01/25 0703) BP: (131-162)/(64-83) 140/69 (01/25 0703) SpO2:  [94 %-100 %] 98 % (01/25 0703) Weight:  [77.7 kg (171 lb 3.2 oz)-78.7 kg (173 lb 9.6 oz)] 77.7 kg (171 lb 3.2 oz) (01/24 1628) Last BM Date: 08/25/16  Intake/Output from previous day: 01/24 0701 - 01/25 0700 In: 3164.2 [P.O.:600; I.V.:2314.2; IV Piggyback:250] Out: 720 [Urine:500; Drains:120; Blood:100] Intake/Output this shift: Total I/O In: 120 [P.O.:120] Out: -   Resp: clear to auscultation bilaterally Chest wall: skin flaps look good Cardio: regular rate and rhythm GI: soft, non-tender; bowel sounds normal; no masses,  no organomegaly  Lab Results:  No results for input(s): WBC, HGB, HCT, PLT in the last 72 hours. BMET No results for input(s): NA, K, CL, CO2, GLUCOSE, BUN, CREATININE, CALCIUM in the last 72 hours. PT/INR No results for input(s): LABPROT, INR in the last 72 hours. ABG No results for input(s): PHART, HCO3 in the last 72 hours.  Invalid input(s): PCO2, PO2  Studies/Results: Dg Chest Port 1 View  Result Date: 08/26/2016 CLINICAL DATA:  Port-A-Cath placement. Right upper outer quadrant breast carcinoma. EXAM: PORTABLE CHEST 1 VIEW COMPARISON:  None. FINDINGS: Left-sided Port-A-Cath is seen with tip overlying the proximal right atrium. No evidence of pneumothorax. Low lung volumes are seen with mild bibasilar atelectasis. Heart size is within normal limits. Aortic atherosclerosis. Surgical clips in the right axilla . IMPRESSION: Left Port-A-Cath in appropriate position. No evidence of pneumothorax. Low lung volumes and bibasilar atelectasis. Electronically Signed   By: Earle Gell M.D.   On: 08/26/2016 12:58   Dg Fluoro Guide Cv Line-no Report  Result Date:  08/26/2016 There is no Radiologist interpretation  for this exam.   Anti-infectives: Anti-infectives    Start     Dose/Rate Route Frequency Ordered Stop   08/26/16 0800  ceFAZolin (ANCEF) IVPB 2g/100 mL premix     2 g 200 mL/hr over 30 Minutes Intravenous To ShortStay Surgical 08/25/16 0837 08/26/16 0850      Assessment/Plan: s/p Procedure(s): RIGHT MASTECTOMY WITH SENTINEL LYMPH NODE BIOPSY (Right) INSERTION PORT-A-CATH (Left) Advance diet  Will add neurontin for pain Teach pt drain care Hopefully will be ready to go home tomorrow  LOS: 0 days    TOTH III,Corneluis Allston S 08/27/2016

## 2016-08-28 DIAGNOSIS — C50411 Malignant neoplasm of upper-outer quadrant of right female breast: Secondary | ICD-10-CM | POA: Diagnosis not present

## 2016-08-28 MED ORDER — GABAPENTIN 100 MG PO CAPS: 100.0000 mg | ORAL_CAPSULE | Freq: Three times a day (TID) | ORAL | 2 refills | Status: DC

## 2016-08-28 MED ORDER — TRAMADOL HCL 50 MG PO TABS: 50.0000 mg | ORAL_TABLET | Freq: Four times a day (QID) | ORAL | 1 refills | Status: DC | PRN

## 2016-08-28 NOTE — Progress Notes (Signed)
2 Days Post-Op  Subjective: Feels much better  Objective: Vital signs in last 24 hours: Temp:  [98.7 F (37.1 C)-99.1 F (37.3 C)] 98.7 F (37.1 C) (01/26 0500) Pulse Rate:  [72-78] 77 (01/26 0500) Resp:  [16-17] 17 (01/26 0500) BP: (137-145)/(59-70) 145/68 (01/26 0500) SpO2:  [96 %-99 %] 98 % (01/26 0500) Last BM Date: 08/25/16  Intake/Output from previous day: 01/25 0701 - 01/26 0700 In: 2020 [P.O.:1320; I.V.:700] Out: 155 [Drains:155] Intake/Output this shift: No intake/output data recorded.  Resp: clear to auscultation bilaterally Chest wall: skin flaps look good Cardio: regular rate and rhythm GI: soft, non-tender; bowel sounds normal; no masses,  no organomegaly  Lab Results:  No results for input(s): WBC, HGB, HCT, PLT in the last 72 hours. BMET No results for input(s): NA, K, CL, CO2, GLUCOSE, BUN, CREATININE, CALCIUM in the last 72 hours. PT/INR No results for input(s): LABPROT, INR in the last 72 hours. ABG No results for input(s): PHART, HCO3 in the last 72 hours.  Invalid input(s): PCO2, PO2  Studies/Results: Dg Chest Port 1 View  Result Date: 08/26/2016 CLINICAL DATA:  Port-A-Cath placement. Right upper outer quadrant breast carcinoma. EXAM: PORTABLE CHEST 1 VIEW COMPARISON:  None. FINDINGS: Left-sided Port-A-Cath is seen with tip overlying the proximal right atrium. No evidence of pneumothorax. Low lung volumes are seen with mild bibasilar atelectasis. Heart size is within normal limits. Aortic atherosclerosis. Surgical clips in the right axilla . IMPRESSION: Left Port-A-Cath in appropriate position. No evidence of pneumothorax. Low lung volumes and bibasilar atelectasis. Electronically Signed   By: Earle Gell M.D.   On: 08/26/2016 12:58   Dg Fluoro Guide Cv Line-no Report  Result Date: 08/26/2016 There is no Radiologist interpretation  for this exam.   Anti-infectives: Anti-infectives    Start     Dose/Rate Route Frequency Ordered Stop   08/26/16  0800  ceFAZolin (ANCEF) IVPB 2g/100 mL premix     2 g 200 mL/hr over 30 Minutes Intravenous To ShortStay Surgical 08/25/16 0837 08/26/16 0850      Assessment/Plan: s/p Procedure(s): RIGHT MASTECTOMY WITH SENTINEL LYMPH NODE BIOPSY (Right) INSERTION PORT-A-CATH (Left) Advance diet Discharge  LOS: 0 days    TOTH III,Flecia Shutter S 08/28/2016

## 2016-08-28 NOTE — Progress Notes (Signed)
Patient discharged to home with instructions and prescription, return demonstrated drain management.

## 2016-09-01 NOTE — Assessment & Plan Note (Addendum)
07/24/2016: Palpable right breast mass for 2 months UOQ at 10:00: 4.7 x 4.1 x 3.7 cm with enlarged axillary lymph node biopsy benign, breast mass biopsy grade 3 IDC ER 30% week PR negative HER-2 negative Ki-67 90%  Treatment plan  based on multidisciplinary tumor board: 1. Right mastectomy 08/26/2016: IDC grade 3, 7 cm, margins negative, 1/17 nodes positive, ER weakly +30%, PR negative, HER-2 negative, Ki-67 90%, T3 N1 stage IIIa 2. Adjuvant chemotherapy with Adriamycin and Cytoxan dose dense 4 followed by Abraxane weekly 12 (Patient cannot state steroids due to diabetes and so we are not using Taxol) 3. Followed by adjuvant radiation therapy 4. Followed by adjuvant antiestrogen therapy --------------------------------------------------------------------------------------------------------------------------  Pathology counseling: I discussed the final pathology report of the patient provided  a copy of this report. I discussed the margins as well as lymph node surgeries. We also discussed the final staging along with previously performed ER/PR and HER-2/neu testing.  I recommended CT chest abdomen pelvis and bone scan for staging. She has an echocardiogram and cardiology appointment. She also is chemotherapy education as well as genetics counseling appointments. Return to clinic in 3 weeks to start chemotherapy.

## 2016-09-02 ENCOUNTER — Ambulatory Visit (HOSPITAL_BASED_OUTPATIENT_CLINIC_OR_DEPARTMENT_OTHER): Payer: Medicare Other | Admitting: Hematology and Oncology

## 2016-09-02 ENCOUNTER — Encounter: Payer: Self-pay | Admitting: Hematology and Oncology

## 2016-09-02 DIAGNOSIS — C50411 Malignant neoplasm of upper-outer quadrant of right female breast: Secondary | ICD-10-CM | POA: Diagnosis not present

## 2016-09-02 DIAGNOSIS — Z170 Estrogen receptor positive status [ER+]: Secondary | ICD-10-CM | POA: Diagnosis not present

## 2016-09-02 NOTE — Progress Notes (Signed)
Patient Care Team: Nolene Ebbs, MD as PCP - General (Internal Medicine) Autumn Messing III, MD as Consulting Physician (General Surgery) Nicholas Lose, MD as Consulting Physician (Hematology and Oncology) Kyung Rudd, MD as Consulting Physician (Radiation Oncology)  DIAGNOSIS:  Encounter Diagnosis  Name Primary?  . Malignant neoplasm of upper-outer quadrant of right breast in female, estrogen receptor positive ([place])     SUMMARY OF ONCOLOGIC HISTORY:   Breast cancer of upper-outer quadrant of right female breast ([place])   07/24/2016 Initial Diagnosis    Palpable right breast mass for 2 months UOQ at 10:00: 4.7 x 4.1 x 3.7 cm with enlarged axillary lymph node biopsy benign, breast mass biopsy grade 3 IDC ER 30% week PR negative HER-2 negative Ki-67 90%       Chemotherapy    Dose dense Adriamycin and Cytoxan 4 followed by weekly Abraxane 12      08/26/2016 Surgery    Right mastectomy: IDC grade 3, 7 cm, margins negative, 1/17 nodes positive, ER weakly +30%, PR negative, HER-2 negative, Ki-67 90%, T3 N1 stage IIIa       CHIEF COMPLIANT: Follow-up after right mastectomy  INTERVAL HISTORY: Casey Olson is a 66 year old with above-mentioned history of right breast cancer underwent mastectomy and is here today to discuss results. She is slightly sore from the recent surgery but otherwise doing well. She is limitation of range of motion of the right arm. She continues to have drains that appeared to be still draining lots of fluid. She has no appointment to see her surgeon today.  REVIEW OF SYSTEMS:   Constitutional: Denies fevers, chills or abnormal weight loss Eyes: Denies blurriness of vision Ears, nose, mouth, throat, and face: Denies mucositis or sore throat Respiratory: Denies cough, dyspnea or wheezes Cardiovascular: Denies palpitation, chest discomfort Gastrointestinal:  Denies nausea, heartburn or change in bowel habits Skin: Denies abnormal skin rashes Lymphatics: Denies  new lymphadenopathy or easy bruising Neurological:Denies numbness, tingling or new weaknesses Behavioral/Psych: Mood is stable, no new changes  Extremities: No lower extremity edema Breast: Right mastectomy All other systems were reviewed with the patient and are negative.  I have reviewed the past medical history, past surgical history, social history and family history with the patient and they are unchanged from previous note.  ALLERGIES:  is allergic to percocet [oxycodone-acetaminophen].  MEDICATIONS:  Current Outpatient Prescriptions  Medication Sig Dispense Refill  . acetaminophen (TYLENOL) 325 MG tablet Take 650 mg by mouth every 6 (six) hours as needed for mild pain.    Marland Kitchen aspirin EC 81 MG tablet Take 81 mg by mouth daily.    . Blood Glucose Monitoring Suppl (ONETOUCH VERIO FLEX SYSTEM) w/Device KIT USE TO TEST 3 TIMES DAILY DX E11.65  11  . Calcium Carbonate-Vitamin D (CALTRATE 600+D PO) Take 1 tablet by mouth 2 (two) times daily.    Marland Kitchen gabapentin (NEURONTIN) 100 MG capsule Take 1 capsule (100 mg total) by mouth 3 (three) times daily. 60 capsule 2  . Hyprom-Naphaz-Polysorb-Zn Sulf (CLEAR EYES COMPLETE OP) Apply 2 drops to eye 2 (two) times daily as needed (irritation).    Marland Kitchen LANTUS SOLOSTAR 100 UNIT/ML Solostar Pen INJECT 20 UNITS IN THE MORNING  5  . lidocaine-prilocaine (EMLA) cream Apply to affected area once (Patient not taking: Reported on 08/18/2016) 30 g 3  . LORazepam (ATIVAN) 0.5 MG tablet Take 1 tablet (0.5 mg total) by mouth every 6 (six) hours as needed (Nausea or vomiting). (Patient not taking: Reported on 08/18/2016) 30 tablet 0  .  losartan (COZAAR) 50 MG tablet Take 50 mg by mouth daily.  5  . metFORMIN (GLUCOPHAGE) 1000 MG tablet Take 1,000 mg by mouth 2 (two) times daily.  5  . Multiple Vitamin (MULTIVITAMIN WITH MINERALS) TABS tablet Take 1 tablet by mouth daily.    . ondansetron (ZOFRAN) 8 MG tablet Take 1 tablet (8 mg total) by mouth 2 (two) times daily as needed.  Start on the third day after chemotherapy. (Patient not taking: Reported on 08/18/2016) 30 tablet 1  . ONETOUCH DELICA LANCETS FINE MISC USE TO TEST 3 TIMES DAILY DX E11.65  11  . ONETOUCH VERIO test strip USE TO TEST 3 TIMES DAILY DX E11.65  11  . prochlorperazine (COMPAZINE) 10 MG tablet Take 1 tablet (10 mg total) by mouth every 6 (six) hours as needed (Nausea or vomiting). (Patient not taking: Reported on 08/18/2016) 30 tablet 1  . simvastatin (ZOCOR) 10 MG tablet Take 10 mg by mouth daily at 6 PM.   2  . traMADol (ULTRAM) 50 MG tablet Take 1-2 tablets (50-100 mg total) by mouth every 6 (six) hours as needed for moderate pain. 30 tablet 1   No current facility-administered medications for this visit.     PHYSICAL EXAMINATION: ECOG PERFORMANCE STATUS: 1 - Symptomatic but completely ambulatory  Vitals:   09/02/16 1140  BP: (!) 152/77  Pulse: (!) 102  Resp: 18  Temp: 98.6 F (37 C)   Filed Weights   09/02/16 1140  Weight: 163 lb 14.4 oz (74.3 kg)    GENERAL:alert, no distress and comfortable SKIN: skin color, texture, turgor are normal, no rashes or significant lesions EYES: normal, Conjunctiva are pink and non-injected, sclera clear OROPHARYNX:no exudate, no erythema and lips, buccal mucosa, and tongue normal  NECK: supple, thyroid normal size, non-tender, without nodularity LYMPH:  no palpable lymphadenopathy in the cervical, axillary or inguinal LUNGS: clear to auscultation and percussion with normal breathing effort HEART: regular rate & rhythm and no murmurs and no lower extremity edema ABDOMEN:abdomen soft, non-tender and normal bowel sounds MUSCULOSKELETAL:no cyanosis of digits and no clubbing  NEURO: alert & oriented x 3 with fluent speech, no focal motor/sensory deficits EXTREMITIES: No lower extremity edema  LABORATORY DATA:  I have reviewed the data as listed   Chemistry      Component Value Date/Time   NA 136 08/21/2016 1333   NA 140 08/05/2016 0917   K 3.7  08/21/2016 1333   K 3.9 08/05/2016 0917   CL 101 08/21/2016 1333   CO2 23 08/21/2016 1333   CO2 25 08/05/2016 0917   BUN 9 08/21/2016 1333   BUN 11.2 08/05/2016 0917   CREATININE 0.59 08/21/2016 1333   CREATININE 0.8 08/05/2016 0917      Component Value Date/Time   CALCIUM 9.7 08/21/2016 1333   CALCIUM 10.1 08/05/2016 0917   ALKPHOS 106 08/05/2016 0917   AST 13 08/05/2016 0917   ALT 13 08/05/2016 0917   BILITOT 0.33 08/05/2016 0917       Lab Results  Component Value Date   WBC 9.0 08/21/2016   HGB 12.5 08/21/2016   HCT 38.1 08/21/2016   MCV 83.6 08/21/2016   PLT 302 08/21/2016   NEUTROABS 5.1 08/05/2016    ASSESSMENT & PLAN:  Breast cancer of upper-outer quadrant of right female breast ([place]) 07/24/2016: Palpable right breast mass for 2 months UOQ at 10:00: 4.7 x 4.1 x 3.7 cm with enlarged axillary lymph node biopsy benign, breast mass biopsy grade 3 IDC ER  30% week PR negative HER-2 negative Ki-67 90%  Treatment plan  based on multidisciplinary tumor board: 1. Right mastectomy 08/26/2016: IDC grade 3, 7 cm, margins negative, 1/17 nodes positive, ER weakly +30%, PR negative, HER-2 negative, Ki-67 90%, T3 N1 stage IIIa 2. Adjuvant chemotherapy with Adriamycin and Cytoxan dose dense 4 followed by Abraxane weekly 12 (Patient cannot state steroids due to diabetes and so we are not using Taxol) 3. Followed by adjuvant radiation therapy 4. Followed by adjuvant antiestrogen therapy --------------------------------------------------------------------------------------------------------------------------  Pathology counseling: I discussed the final pathology report of the patient provided  a copy of this report. I discussed the margins as well as lymph node surgeries. We also discussed the final staging along with previously performed ER/PR and HER-2/neu testing.  I recommended CT chest abdomen pelvis and bone scan for staging. She has an echocardiogram and cardiology  appointment. She also is chemotherapy education as well as genetics counseling appointments. Return to clinic in 3 weeks to start chemotherapy.  The reason for choosing Abraxane is because of patient is diabetic and cannot take steroids  I spent 25 minutes talking to the patient of which more than half was spent in counseling and coordination of care.  Orders Placed This Encounter  Procedures  . CT Abdomen Pelvis W Contrast    Standing Status:   Future    Standing Expiration Date:   09/02/2017    Order Specific Question:   If indicated for the ordered procedure, I authorize the administration of contrast media per Radiology protocol    Answer:   Yes    Order Specific Question:   Reason for Exam (SYMPTOM  OR DIAGNOSIS REQUIRED)    Answer:   Stage 3 breast cancer initial staging    Order Specific Question:   Preferred imaging location?    Answer:   [facility]  . CT Chest W Contrast    Standing Status:   Future    Standing Expiration Date:   09/02/2017    Order Specific Question:   If indicated for the ordered procedure, I authorize the administration of contrast media per Radiology protocol    Answer:   Yes    Order Specific Question:   Reason for Exam (SYMPTOM  OR DIAGNOSIS REQUIRED)    Answer:   Stage 3 breast cancer initial staging    Order Specific Question:   Preferred imaging location?    Answer:   [facility]    Standing Status:   Future    Standing Expiration Date:   09/02/2017    Order Specific Question:   Reason for Exam (SYMPTOM  OR DIAGNOSIS REQUIRED)    Answer:   Stage 3 breast cancer initial staging    Order Specific Question:   Preferred imaging location?    Answer:   [facility]    Order Specific Question:   If indicated for the ordered procedure, I authorize the administration of a radiopharmaceutical per Radiology protocol    Answer:   Yes   The patient has a good understanding of the overall plan. she agrees  with it. she will call with any problems that may develop before the next visit here.   Rulon Eisenmenger, MD 09/02/16

## 2016-09-09 ENCOUNTER — Ambulatory Visit (HOSPITAL_COMMUNITY)
Admission: RE | Admit: 2016-09-09 | Discharge: 2016-09-09 | Disposition: A | Discharge status: Home / Self Care | Payer: Medicare Other | Source: Ambulatory Visit | Attending: Hematology and Oncology | Admitting: Hematology and Oncology

## 2016-09-09 ENCOUNTER — Encounter: Payer: Self-pay | Admitting: *Deleted

## 2016-09-09 ENCOUNTER — Encounter (HOSPITAL_COMMUNITY): Payer: Self-pay

## 2016-09-09 ENCOUNTER — Other Ambulatory Visit: Payer: Medicare Other

## 2016-09-09 ENCOUNTER — Encounter (HOSPITAL_COMMUNITY)
Admission: RE | Admit: 2016-09-09 | Discharge: 2016-09-09 | Disposition: A | Discharge status: Home / Self Care | Payer: Medicare Other | Source: Ambulatory Visit | Attending: Hematology and Oncology | Admitting: Hematology and Oncology

## 2016-09-09 DIAGNOSIS — C50411 Malignant neoplasm of upper-outer quadrant of right female breast: Secondary | ICD-10-CM | POA: Diagnosis not present

## 2016-09-09 DIAGNOSIS — K769 Liver disease, unspecified: Secondary | ICD-10-CM | POA: Insufficient documentation

## 2016-09-09 DIAGNOSIS — Z170 Estrogen receptor positive status [ER+]: Secondary | ICD-10-CM | POA: Insufficient documentation

## 2016-09-09 DIAGNOSIS — Z9011 Acquired absence of right breast and nipple: Secondary | ICD-10-CM | POA: Diagnosis not present

## 2016-09-09 MED ORDER — IOPAMIDOL (ISOVUE-300) INJECTION 61%
100.0000 mL | Freq: Once | INTRAVENOUS | Status: AC | PRN
Start: 2016-09-09 — End: 2016-09-09
  Administered 2016-09-09: 80 mL via INTRAVENOUS

## 2016-09-09 MED ORDER — IOPAMIDOL (ISOVUE-300) INJECTION 61%
INTRAVENOUS | Status: AC
  Filled 2016-09-09: qty 100

## 2016-09-09 MED ORDER — TECHNETIUM TC 99M MEDRONATE IV KIT
19.9000 | PACK | Freq: Once | INTRAVENOUS | Status: AC | PRN
  Administered 2016-09-09: 19.9 via INTRAVENOUS

## 2016-09-09 MED ORDER — SODIUM CHLORIDE 0.9 % IJ SOLN
INTRAMUSCULAR | Status: AC
  Filled 2016-09-09: qty 50

## 2016-09-11 ENCOUNTER — Ambulatory Visit: Payer: Medicare Other | Attending: General Surgery | Admitting: Physical Therapy

## 2016-09-11 DIAGNOSIS — M6281 Muscle weakness (generalized): Secondary | ICD-10-CM | POA: Diagnosis present

## 2016-09-11 DIAGNOSIS — M25511 Pain in right shoulder: Secondary | ICD-10-CM | POA: Diagnosis present

## 2016-09-11 DIAGNOSIS — R609 Edema, unspecified: Secondary | ICD-10-CM | POA: Diagnosis present

## 2016-09-11 DIAGNOSIS — Z170 Estrogen receptor positive status [ER+]: Secondary | ICD-10-CM | POA: Insufficient documentation

## 2016-09-11 DIAGNOSIS — M25611 Stiffness of right shoulder, not elsewhere classified: Secondary | ICD-10-CM | POA: Diagnosis present

## 2016-09-11 DIAGNOSIS — Z483 Aftercare following surgery for neoplasm: Secondary | ICD-10-CM | POA: Diagnosis present

## 2016-09-11 DIAGNOSIS — C50411 Malignant neoplasm of upper-outer quadrant of right female breast: Secondary | ICD-10-CM | POA: Diagnosis present

## 2016-09-11 DIAGNOSIS — R293 Abnormal posture: Secondary | ICD-10-CM | POA: Diagnosis present

## 2016-09-11 NOTE — Patient Instructions (Addendum)
First of all, check with your insurance company to see if provider is in [place]                                            [address]  [place], [place] [address] [phone]    Does not file for insurance--- call for appointment with [facility]   (for wigs and compression sleeves and gloves)  [place], [place] [address] [phone]  Will file some insurances --- call for appointment   Second to [facility] (for mastectomy prosthetics and garments) [place], [place] [address] [phone] Will file some insurances --- call for appointment  [facility]  [address]  [place], [place] [address] [phone] Lower extremity garments  [place], owner [phone])   [phone] phone)    [phone] (fax) Will do home visit to measure for custom day and nighttime garments, can get pumps Will file insurance  [place] Sales rep:  Kern [id number]  [phone] [email or web address] Biocompression pumps   [employer]  Sales rep: Donneta Romberg:  [phone] [email or web address] [place] Pulley and Flexitouch pumps    Other Resources: National Lymphedema Network:  www.lymphnet.org www.[employer].com for patient articles and purchase a self manual lymph drainage DVD www.[email or web address] has informative articles.   SHOULDER: Flexion - Supine (Cane)        Cancer [facility] [phone]    Hold cane in both hands. Raise arms up overhead. Do not allow back to arch. Hold _5__ seconds. Do __5-10__ times; __1-2__ times a day.   SELF ASSISTED WITH OBJECT: Shoulder Abduction / Adduction - Supine    Hold cane with both hands. Move both arms from side to side, keep elbows straight.  Hold when stretch felt for __5__ seconds. Repeat __5-10__ times; __1-2__ times a day. Once this becomes easier progress to third picture bringing affected arm towards ear  by staying out to side. Same hold for _5_seconds. Repeat  _5-10_ times, _1-2_ times/day.

## 2016-09-11 NOTE — Therapy (Signed)
[place], [place], [address] Phone: [phone]   Fax:  [phone]  Physical Therapy Evaluation  Patient Details  Name: Casey Olson MRN: [id number] Date of Birth: March 16, 1951 Referring Provider: Dr. Marlou Starks  Encounter Date: 09/11/2016      PT End of Session - 09/11/16 1142    Visit Number 1   Number of Visits 7   Date for PT Re-Evaluation 10/30/16   PT Start Time 1030   PT Stop Time 1115   PT Time Calculation (min) 45 min   Activity Tolerance Patient limited by pain   Behavior During Therapy [facility] for tasks assessed/performed      Past Medical History:  Diagnosis Date  . Bronchitis   . Cancer [facility])    right breast  . Diabetes mellitus without complication ([place])    type 2  . GERD (gastroesophageal reflux disease)   . High cholesterol   . Hypertension     Past Surgical History:  Procedure Laterality Date  . ABDOMINAL HYSTERECTOMY    . COLONOSCOPY    . MASTECTOMY W/ SENTINEL NODE BIOPSY Right 08/26/2016   Procedure: RIGHT MASTECTOMY WITH SENTINEL LYMPH NODE BIOPSY;  Surgeon: Autumn Messing III, MD;  Location: [place];  Service: General;  Laterality: Right;  . PORTACATH PLACEMENT Left 08/26/2016   Procedure: INSERTION PORT-A-CATH;  Surgeon: Autumn Messing III, MD;  Location: [place];  Service: General;  Laterality: Left;  . TUBAL LIGATION    . WISDOM TOOTH EXTRACTION      There were no vitals filed for this visit.       Subjective Assessment - 09/11/16 1040    Subjective Its getting better and better every day    Patient is accompained by: Family member  oldest daughter Casey Olson    Pertinent History Patient was diagnosed on 07/23/16 with right grade 3 invasive ductal carcinoma breast cancer.  It is located in the upper outer quadrant and measures 4.7 cm, is ER positive and PR negative, HEr2 negative and has a Ki67 of 90%.  Pt underwent an right mastectomy on 08/26/2017 they took out 17 lymph nodes with 1  positive. Past pressure includes DM, high blood pressure and high cholesterol  Pt will start chemo on Feb. 23, but hopes to not have radiation.    Patient Stated Goals to be able to move her arm like she used to    Currently in Pain? No/denies            [facility] PT Assessment - 09/11/16 0001      Assessment   Medical Diagnosis Right breast cancer   Referring Provider Dr. Marlou Starks   Onset Date/Surgical Date 07/23/16   Hand Dominance Right   Prior Therapy none     Precautions   Precautions Other (comment)   Precaution Comments Active cancer     Restrictions   Weight Bearing Restrictions No     Balance Screen   Has the patient fallen in the past 6 months No   Has the patient had a decrease in activity level because of a fear of falling?  No   Is the patient reluctant to leave their home because of a fear of falling?  No     Home Environment   Living Environment Private residence   Living Arrangements Children  Daughter and 103 y.o. granddaughter   Available Help at Discharge Family     Prior Function   Level of [place] with basic ADLs  needs  assist putting on her coat, needs button down shirts    Vocation Retired   [employer] was driving a bus for a preschool    Leisure She does not exercise     Cognition   Overall Cognitive Status Within Functional Limits for tasks assessed     Observation/Other Assessments   Observations Pt comes in with her daughter from [place], She is moving slowly and appears to be overwhelmed when she talks about all the appointments associated with her diagnosis  She is wearing post op bandaeau with ABD pad under it next to skin over incision which appears to be healing well. She has fullness at anterior axilla above bandeau  Pt states she has to keep pulling her bandeau up     Quick DASH  54.55     Sensation   Light Touch Impaired by gross assessment   Additional Comments pt reports altered sensation at medical side of  right upper arm      Coordination   Gross Motor Movements are Fluid and Coordinated Yes  slowed      Posture/Postural Control   Posture/Postural Control Postural limitations   Postural Limitations Rounded Shoulders;Forward head     ROM / Strength   AROM / PROM / Strength AROM;Strength     AROM   Right Shoulder Extension --   Right Shoulder Flexion 75 Degrees   Right Shoulder ABduction 73 Degrees   Right Shoulder Internal Rotation --   Right Shoulder External Rotation 70 Degrees   Left Shoulder Extension --   Left Shoulder Flexion 147 Degrees   Left Shoulder ABduction 150 Degrees   Left Shoulder Internal Rotation --   Left Shoulder External Rotation 80 Degrees   Cervical Flexion WNl   Cervical Extension WNL   Cervical - Right Side Bend WNL   Cervical - Left Side Bend WNL   Cervical - Right Rotation WNL   Cervical - Left Rotation WNL     Strength   Overall Strength Deficits   Overall Strength Comments pt with winging of right scpula with attempted shoulder elevation    Strength Assessment Site Shoulder   Right/Left Shoulder Right   Right Shoulder Flexion 2+/5   Right Shoulder ABduction 2+/5           LYMPHEDEMA/ONCOLOGY QUESTIONNAIRE - 09/11/16 1139      Type   Cancer Type Right breast cancer     Surgeries   Mastectomy Date 08/26/16   Axillary Lymph Node Dissection Date 08/26/16   Number Lymph Nodes Removed 17     What other symptoms do you have   Are you Having Heaviness or Tightness Yes  post op    Are you having Pain Yes  post op    Are you having pitting edema No   Is it Hard or Difficult finding clothes that fit No   Do you have infections No   [place] Sign No     Right Upper Extremity Lymphedema   10 cm Proximal to Olecranon Process 33 cm   Olecranon Process 26.5 cm   15 cm Proximal to Ulnar Styloid Process 26.2 cm   10 cm Proximal to Ulnar Styloid Process 22.5 cm   Just Proximal to Ulnar Styloid Process 17 cm   Across Hand at [employer]  20 cm   At [place] of 2nd Digit 6.5 cm           Quick Dash - 09/11/16 0001    Open a tight or new  jar Moderate difficulty   Do heavy household chores (wash walls, wash floors) Severe difficulty   Carry a shopping bag or briefcase Mild difficulty   Wash your back Severe difficulty   Use a knife to cut food Mild difficulty   Recreational activities in which you take some force or impact through your arm, shoulder, or hand (golf, hammering, tennis) Unable   During the past week, to what extent has your arm, shoulder or hand problem interfered with your normal social activities with family, friends, neighbors, or groups? Modererately   During the past week, to what extent has your arm, shoulder or hand problem limited your work or other regular daily activities Modererately   Arm, shoulder, or hand pain. Moderate   Tingling (pins and needles) in your arm, shoulder, or hand Moderate   Difficulty Sleeping Moderate difficulty   DASH Score 54.55 %                     PT Education - 09/11/16 1141    Education provided Yes   Education Details supine dowel rod exercise, reminder about ABC class, where to get knitted knockers and mastectomy bras    Person(s) Educated Patient;Child(ren)  daughter Casey Olson    Methods Explanation;Demonstration;Handout   Comprehension Verbalized understanding;Returned demonstration           Short Term Clinic Goals - 09/11/16 1150      CC Short Term Goal  #1   Title Patient with verbalize an understanding of lymphedema risk reduction precautions   Time 3   Period Weeks   Status New     CC Short Term Goal  #2   Title Patient will be independent in a basic  home exercise program             Long Term Clinic Goals - 09/11/16 1151      CC Long Term Goal  #1   Title Patient will improve shoulder flexion range of motion to 150 degrees to perform activities of daily living and household chores with greater ease   Time 6   Period Weeks    Status New     CC Long Term Goal  #2   Title Patient will improve left shoulder abduction to 120 degrees so that she can be independent in dressing    Time 6   Period Weeks   Status New     CC Long Term Goal  #3   Title Patient will decrease the DASH score to < 38   to demonstrate increased functional use of upper extremity   Time 6   Period Weeks   Status New            Plan - 09/11/16 1143    Clinical Impression Statement 66 yo female 2 1/2 weeks post mastectomy with ALND with cormorbidies of DM, HTN and Hypercolesterolemia is seen for PT eval.  She has decreased ROM and strength associated with pain post op.  She has fullness at anterior chest and upper arm  She is risk for lymphedema with 17 nodes removed and has no risk reduction knowledge.  Her co-pay is $35 so she thinks she can only come one time per week.  Becasue of all these factors this is a moderately complex eval    Rehab Potential Good   Clinical Impairments Affecting Rehab Potential 17 nodes removed.  will have ongoing chemotherapy    PT Frequency 1x / week   PT Duration 6 weeks  PT Treatment/Interventions ADLs/Self Care Home Management;Patient/family education;Orthotic Fit/Training;Taping;Manual techniques;Manual lymph drainage;Compression bandaging;Passive range of motion;Neuromuscular re-education;Therapeutic exercise;Therapeutic activities;Functional mobility training   PT Next Visit Plan Active/ acitve assited and passive range of motion to right shoulder.  neuromuscular training ang stregnthening of scapular muscles with posture retraining. progress HEP  make sure pt signs up for ABC class , later Strength ABC program    Consulted and Agree with Plan of Care Patient;Family member/caregiver      Patient will benefit from skilled therapeutic intervention in order to improve the following deficits and impairments:  Postural dysfunction, Decreased knowledge of precautions, Pain, Impaired UE functional use, Decreased  range of motion, Decreased skin integrity, Increased edema, Decreased strength, Decreased knowledge of use of DME, Decreased activity tolerance  Visit Diagnosis: Aftercare following surgery for neoplasm - Plan: PT plan of care cert/re-cert  Edema, unspecified type - Plan: PT plan of care cert/re-cert  Acute pain of right shoulder - Plan: PT plan of care cert/re-cert  Stiffness of right shoulder, not elsewhere classified - Plan: PT plan of care cert/re-cert  Muscle weakness (generalized) - Plan: PT plan of care cert/re-cert      G-Codes - 99/37/16 1153    Functional Assessment Tool Used Quick DASH    Functional Limitation Carrying, moving and handling objects   Carrying, Moving and Handling Objects Current Status (R6789) At least 40 percent but less than 60 percent impaired, limited or restricted   Carrying, Moving and Handling Objects Goal Status (F8101) At least 20 percent but less than 40 percent impaired, limited or restricted       Problem List Patient Active Problem List   Diagnosis Date Noted  . Malignant neoplasm of upper-outer quadrant of right breast in female, estrogen receptor positive ([place]) 08/05/2016  . Breast cancer of upper-outer quadrant of right female breast ([place]) 07/31/2016   Donato Heinz. Owens Shark PT  Norwood Levo 09/11/2016, 11:55 AM  [place], [place], [address] Phone: [phone]   Fax:  [phone]  Name: Casey Olson MRN: [id number] Date of Birth: 1950-08-09

## 2016-09-16 ENCOUNTER — Encounter (HOSPITAL_COMMUNITY): Payer: Self-pay

## 2016-09-16 ENCOUNTER — Ambulatory Visit (HOSPITAL_BASED_OUTPATIENT_CLINIC_OR_DEPARTMENT_OTHER)
Admission: RE | Admit: 2016-09-16 | Discharge: 2016-09-16 | Disposition: A | Discharge status: Home / Self Care | Payer: Medicare Other | Source: Ambulatory Visit | Attending: Cardiology | Admitting: Cardiology

## 2016-09-16 ENCOUNTER — Ambulatory Visit (HOSPITAL_COMMUNITY)
Admission: RE | Admit: 2016-09-16 | Discharge: 2016-09-16 | Disposition: A | Discharge status: Home / Self Care | Payer: Medicare Other | Source: Ambulatory Visit | Attending: Cardiology | Admitting: Cardiology

## 2016-09-16 ENCOUNTER — Other Ambulatory Visit (HOSPITAL_COMMUNITY): Payer: Medicare Other

## 2016-09-16 VITALS — BP 144/72 | HR 84 | Wt 162.5 lb

## 2016-09-16 DIAGNOSIS — Z800 Family history of malignant neoplasm of digestive organs: Secondary | ICD-10-CM | POA: Diagnosis not present

## 2016-09-16 DIAGNOSIS — I10 Essential (primary) hypertension: Secondary | ICD-10-CM | POA: Insufficient documentation

## 2016-09-16 DIAGNOSIS — Z9011 Acquired absence of right breast and nipple: Secondary | ICD-10-CM | POA: Insufficient documentation

## 2016-09-16 DIAGNOSIS — E785 Hyperlipidemia, unspecified: Secondary | ICD-10-CM | POA: Diagnosis not present

## 2016-09-16 DIAGNOSIS — Z7982 Long term (current) use of aspirin: Secondary | ICD-10-CM | POA: Diagnosis not present

## 2016-09-16 DIAGNOSIS — C50411 Malignant neoplasm of upper-outer quadrant of right female breast: Secondary | ICD-10-CM

## 2016-09-16 DIAGNOSIS — Z170 Estrogen receptor positive status [ER+]: Secondary | ICD-10-CM

## 2016-09-16 DIAGNOSIS — E119 Type 2 diabetes mellitus without complications: Secondary | ICD-10-CM | POA: Insufficient documentation

## 2016-09-16 DIAGNOSIS — Z79899 Other long term (current) drug therapy: Secondary | ICD-10-CM | POA: Diagnosis not present

## 2016-09-16 DIAGNOSIS — K219 Gastro-esophageal reflux disease without esophagitis: Secondary | ICD-10-CM | POA: Diagnosis not present

## 2016-09-16 DIAGNOSIS — Z794 Long term (current) use of insulin: Secondary | ICD-10-CM | POA: Insufficient documentation

## 2016-09-16 LAB — ECHOCARDIOGRAM COMPLETE
AVLVOTPG: 4 mmHg
CHL CUP DOP CALC LVOT VTI: 26.7 cm
CHL CUP LV S' LATERAL: 8.96 cm/s
CHL CUP MV DEC (S): 218
CHL CUP RV SYS PRESS: 19 mmHg
CHL CUP TV REG PEAK VELOCITY: 197 cm/s
E decel time: 218 msec
E/e' ratio: 13.12
FS: 24 % — AB (ref 28–44)
IVS/LV PW RATIO, ED: 1.11
LA diam end sys: 33 mm
LA diam index: 1.84 cm/m2
LA vol: 39 mL
LASIZE: 33 mm
LAVOLA4C: 40.8 mL
LAVOLIN: 21.8 mL/m2
LDCA: 3.14 cm2
LV E/e' medial: 13.12
LV E/e'average: 13.12
LV PW d: 9.9 mm — AB (ref 0.6–1.1)
LV SIMPSON'S DISK: 62
LV TDI E'LATERAL: 6.53
LV TDI E'MEDIAL: 6.09
LV dias vol index: 33 mL/m2
LV e' LATERAL: 6.53 cm/s
LV sys vol index: 13 mL/m2
LV sys vol: 23 mL (ref 14–42)
LVDIAVOL: 59 mL (ref 46–106)
LVOT peak vel: 104 cm/s
LVOTD: 20 mm
LVOTSV: 84 mL
Lateral S' vel: 16.5 cm/s
MVPG: 3 mmHg
MVPKAVEL: 119 m/s
MVPKEVEL: 85.7 m/s
Stroke v: 37 ml
TAPSE: 24.6 mm
TRMAXVEL: 197 cm/s

## 2016-09-16 NOTE — Patient Instructions (Signed)
Follow up and Echo with Dr.McLean in 3months  

## 2016-09-16 NOTE — Progress Notes (Signed)
Oncology: Dr. Lindi Adie  66 yo with history of HTN, DM2, hyperlipidemia, and breast cancer presents for cardio-oncology appointment.  No prior cardiac history, nonsmoker, no family history of cardiomyopathy.  She has good exercise tolerance, no chest pain or exertional dyspnea.    Breast cancer was diagnosed in 12/17, ER+/PR-/HER2-.  Right mastectomy in 1/18.  She will start Adriamycin/Cytoxan x 4 on 09/25/16, then abraxane weekly x 12.   PMH: 1. HTN 2. Type II diabetes 3. Hyperlipidemia 4. GERD 5. Breast cancer: Diagnosed in 12/17, ER+/PR-/HER2-.  Right mastectomy in 1/18.  She will start Adriamycin/Cytoxan x 4 on 09/25/16, then abraxane weekly x 12.   - Echo (2/18): EF 60-65%, GLS -18.1%, normal RV size and systolic function.   Social History   Social History  . Marital status: Divorced    Spouse name: N/A  . Number of children: N/A  . Years of education: N/A   Social History Main Topics  . Smoking status: Never Smoker  . Smokeless tobacco: Never Used  . Alcohol use No  . Drug use: No  . Sexual activity: Not Asked   Other Topics Concern  . None   Social History Narrative  . None   Family History  Problem Relation Age of Onset  . Breast cancer Cousin 20    mat first cousin  . Pancreatic cancer Sister    ROS: All systems reviewed and negative except as per HPI.  Current Outpatient Prescriptions  Medication Sig Dispense Refill  . acetaminophen (TYLENOL) 325 MG tablet Take 650 mg by mouth every 6 (six) hours as needed for mild pain.    Marland Kitchen aspirin EC 81 MG tablet Take 81 mg by mouth daily.    . Blood Glucose Monitoring Suppl (ONETOUCH VERIO FLEX SYSTEM) w/Device KIT USE TO TEST 3 TIMES DAILY DX E11.65  11  . Calcium Carbonate-Vitamin D (CALTRATE 600+D PO) Take 1 tablet by mouth 2 (two) times daily.    Marland Kitchen gabapentin (NEURONTIN) 100 MG capsule Take 1 capsule (100 mg total) by mouth 3 (three) times daily. 60 capsule 2  . Hyprom-Naphaz-Polysorb-Zn Sulf (CLEAR EYES COMPLETE OP)  Apply 2 drops to eye 2 (two) times daily as needed (irritation).    Marland Kitchen LANTUS SOLOSTAR 100 UNIT/ML Solostar Pen INJECT 20 UNITS IN THE MORNING  5  . losartan (COZAAR) 50 MG tablet Take 50 mg by mouth daily.  5  . metFORMIN (GLUCOPHAGE) 1000 MG tablet Take 1,000 mg by mouth 2 (two) times daily.  5  . Multiple Vitamin (MULTIVITAMIN WITH MINERALS) TABS tablet Take 1 tablet by mouth daily.    Glory Rosebush DELICA LANCETS FINE MISC USE TO TEST 3 TIMES DAILY DX E11.65  11  . ONETOUCH VERIO test strip USE TO TEST 3 TIMES DAILY DX E11.65  11  . simvastatin (ZOCOR) 10 MG tablet Take 10 mg by mouth daily at 6 PM.   2  . traMADol (ULTRAM) 50 MG tablet Take 1-2 tablets (50-100 mg total) by mouth every 6 (six) hours as needed for moderate pain. 30 tablet 1  . lidocaine-prilocaine (EMLA) cream Apply to affected area once (Patient not taking: Reported on 08/18/2016) 30 g 3  . LORazepam (ATIVAN) 0.5 MG tablet Take 1 tablet (0.5 mg total) by mouth every 6 (six) hours as needed (Nausea or vomiting). (Patient not taking: Reported on 09/16/2016) 30 tablet 0  . ondansetron (ZOFRAN) 8 MG tablet Take 1 tablet (8 mg total) by mouth 2 (two) times daily as needed. Start on the  third day after chemotherapy. (Patient not taking: Reported on 08/18/2016) 30 tablet 1  . prochlorperazine (COMPAZINE) 10 MG tablet Take 1 tablet (10 mg total) by mouth every 6 (six) hours as needed (Nausea or vomiting). (Patient not taking: Reported on 08/18/2016) 30 tablet 1   No current facility-administered medications for this encounter.    BP (!) 144/72   Pulse 84   Wt 162 lb 8 oz (73.7 kg)   SpO2 100%   BMI 28.33 kg/m   General: NAD Neck: No JVD, no thyromegaly or thyroid nodule.  Lungs: Clear to auscultation bilaterally with normal respiratory effort. CV: Nondisplaced PMI.  Heart regular S1/S2, no S3/S4, no murmur.  No peripheral edema.  No carotid bruit.  Normal pedal pulses.  Abdomen: Soft, nontender, no hepatosplenomegaly, no distention.   Skin: Intact without lesions or rashes.  Neurologic: Alert and oriented x 3.  Psych: Normal affect. Extremities: No clubbing or cyanosis.  HEENT: Normal.   Assessment/Plan: 1. Breast cancer: Baseline echo was reviewed today, no significant abnormalities.  She will begin Adriamycin/Cytoxan on 2/23.  I discussed with her the cardiac risk from Adriamycin and the rationale behind echocardiogram monitoring.   - Repeat echo in 3 months when Adriamycin is completed.  - Echo again in 9 months to catch any late Adriamycin effect.  2. HTN: BP mildly elevated but would not adjust meds.   Loralie Champagne 09/16/2016

## 2016-09-16 NOTE — Progress Notes (Signed)
  Echocardiogram 2D Echocardiogram has been performed.  Jennette Dubin 09/16/2016, 11:14 AM

## 2016-09-17 ENCOUNTER — Ambulatory Visit: Payer: Medicare Other

## 2016-09-17 DIAGNOSIS — M6281 Muscle weakness (generalized): Secondary | ICD-10-CM

## 2016-09-17 DIAGNOSIS — Z483 Aftercare following surgery for neoplasm: Secondary | ICD-10-CM

## 2016-09-17 DIAGNOSIS — R293 Abnormal posture: Secondary | ICD-10-CM

## 2016-09-17 DIAGNOSIS — M25511 Pain in right shoulder: Secondary | ICD-10-CM

## 2016-09-17 DIAGNOSIS — M25611 Stiffness of right shoulder, not elsewhere classified: Secondary | ICD-10-CM

## 2016-09-17 DIAGNOSIS — Z170 Estrogen receptor positive status [ER+]: Secondary | ICD-10-CM

## 2016-09-17 DIAGNOSIS — C50411 Malignant neoplasm of upper-outer quadrant of right female breast: Secondary | ICD-10-CM

## 2016-09-17 DIAGNOSIS — R609 Edema, unspecified: Secondary | ICD-10-CM

## 2016-09-17 NOTE — Therapy (Signed)
[place], [place], [address] Phone: [phone]   Fax:  [phone]  Physical Therapy Treatment  Patient Details  Name: Casey Olson MRN: [id number] Date of Birth: [birth year] Referring Provider: Dr. Marlou Starks  Encounter Date: 09/17/2016      PT End of Session - 09/17/16 1209    Visit Number 2   Number of Visits 7   Date for PT Re-Evaluation 10/30/16   PT Start Time 1106   PT Stop Time 1150   PT Time Calculation (min) 44 min   Activity Tolerance Patient tolerated treatment well   Behavior During Therapy [facility] for tasks assessed/performed      Past Medical History:  Diagnosis Date  . Bronchitis   . Cancer [facility])    right breast  . Diabetes mellitus without complication ([place])    type 2  . GERD (gastroesophageal reflux disease)   . High cholesterol   . Hypertension     Past Surgical History:  Procedure Laterality Date  . ABDOMINAL HYSTERECTOMY    . COLONOSCOPY    . MASTECTOMY W/ SENTINEL NODE BIOPSY Right 08/26/2016   Procedure: RIGHT MASTECTOMY WITH SENTINEL LYMPH NODE BIOPSY;  Surgeon: Autumn Messing III, MD;  Location: [place];  Service: General;  Laterality: Right;  . PORTACATH PLACEMENT Left 08/26/2016   Procedure: INSERTION PORT-A-CATH;  Surgeon: Autumn Messing III, MD;  Location: [place];  Service: General;  Laterality: Left;  . TUBAL LIGATION    . WISDOM TOOTH EXTRACTION      There were no vitals filed for this visit.      Subjective Assessment - 09/17/16 1111    Subjective I've been trying the cane exercises from last visit and they are going okay but I think the pendulum exercises Dr. Marlou Starks told me to do have been helping more. I find I've been keeping my shoulder up alot and my family tells me the same.    Pertinent History Patient was diagnosed on 07/23/16 with right grade 3 invasive ductal carcinoma breast cancer.  It is located in the upper outer quadrant and measures 4.7 cm, is ER positive and  PR negative, HEr2 negative and has a Ki67 of 90%.  Pt underwent an right mastectomy on 08/26/2017 they took out 17 lymph nodes with 1 positive. Past pressure includes DM, high blood pressure and high cholesterol  Pt will start chemo on Feb. 23, but hopes to not have radiation.    Patient Stated Goals to be able to move her arm like she used to    Currently in Pain? No/denies                         [facility] Adult PT Treatment/Exercise - 09/17/16 0001      Shoulder Exercises: Pulleys   Flexion 2 minutes   ABduction 2 minutes     Shoulder Exercises: Therapy Ball   Flexion 10 reps  Trying to do forward lean into top of stretch     Shoulder Exercises: ROM/Strengthening   Other ROM/Strengthening Exercises Finger Ladder for Rt UE scaption 5 times up to 11 but got to 12 last time.     Manual Therapy   Passive ROM In Supine to Rt shoulder with HOB slightly elevated into flexion, abduction, er, and IR all to pts tolerance. She needed mod VC to relax throughout and was limited in tolerance to abduction.  Short Term Clinic Goals - 09/11/16 1150      CC Short Term Goal  #1   Title Patient with verbalize an understanding of lymphedema risk reduction precautions   Time 3   Period Weeks   Status New     CC Short Term Goal  #2   Title Patient will be independent in a basic  home exercise program         Long Term Clinic Goals - 09/11/16 1151      CC Long Term Goal  #1   Title Patient will improve shoulder flexion range of motion to 150 degrees to perform activities of daily living and household chores with greater ease   Time 6   Period Weeks   Status New     CC Long Term Goal  #2   Title Patient will improve left shoulder abduction to 120 degrees so that she can be independent in dressing    Time 6   Period Weeks   Status New     CC Long Term Goal  #3   Title Patient will decrease the DASH score to < 38   to demonstrate increased  functional use of upper extremity   Time 6   Period Weeks   Status New            Plan - 09/17/16 1211    Clinical Impression Statement Pt tolerated first session well though was hesitant at first with AA/ROM exercises in gym. Also required mod VC to relax throughout P/ROM but was able to do so after cuing. She reported feeling good after session with no increase pain.    Rehab Potential Good   Clinical Impairments Affecting Rehab Potential 17 nodes removed.  will have ongoing chemotherapy    PT Frequency 1x / week   PT Duration 6 weeks   PT Treatment/Interventions ADLs/Self Care Home Management;Patient/family education;Orthotic Fit/Training;Taping;Manual techniques;Manual lymph drainage;Compression bandaging;Passive range of motion;Neuromuscular re-education;Therapeutic exercise;Therapeutic activities;Functional mobility training   PT Next Visit Plan Remeasure ROM. Cont AA/A/P/ROM to right shoulder.  May need review of dowel exercises. neuromuscular training and stregnthening of scapular muscles with posture retraining. progress HEP  make sure pt signs up for ABC class , later Strength ABC program    Consulted and Agree with Plan of Care Patient;Family member/caregiver   Family Member Consulted Sister, Katharine Look present for session      Patient will benefit from skilled therapeutic intervention in order to improve the following deficits and impairments:  Postural dysfunction, Decreased knowledge of precautions, Pain, Impaired UE functional use, Decreased range of motion, Decreased skin integrity, Increased edema, Decreased strength, Decreased knowledge of use of DME, Decreased activity tolerance  Visit Diagnosis: Aftercare following surgery for neoplasm  Edema, unspecified type  Acute pain of right shoulder  Stiffness of right shoulder, not elsewhere classified  Muscle weakness (generalized)  Abnormal posture  Carcinoma of upper-outer quadrant of right breast in female, estrogen  receptor positive ([place])     Problem List Patient Active Problem List   Diagnosis Date Noted  . Malignant neoplasm of upper-outer quadrant of right breast in female, estrogen receptor positive ([place]) 08/05/2016  . Breast cancer of upper-outer quadrant of right female breast ([place]) 07/31/2016    Otelia Limes, PTA 09/17/2016, 12:15 PM  [place], [place], [address] Phone: [phone]   Fax:  [phone]  Name: Casey Olson MRN: [id number] Date of Birth: 1951-06-16

## 2016-09-23 ENCOUNTER — Encounter: Payer: Self-pay | Admitting: Hematology and Oncology

## 2016-09-23 NOTE — Progress Notes (Signed)
Called pt to introduce myself as her FA and to discuss copay assistance.  We discussed the Patient Casey Olson which she gave me consent to apply in her behalf.  She is approved for $5000 to cover drugs associated w/ her Dx for 12 months from 09/23/16 w/ a 6 month look back period. [id number] is guaranteed, $3350 is accessible on a first-come first-served basis as long as funding is available. I emailed copy of the approval letter &POE to Belinda Fisher &Nicole in billing and gave a copy to HIM to scan in pt's chart.  Informed pt of the [place] &went over what it will cover. She will bring her proof of income on her next visit.

## 2016-09-24 ENCOUNTER — Ambulatory Visit: Payer: Medicare Other | Admitting: Physical Therapy

## 2016-09-24 DIAGNOSIS — R293 Abnormal posture: Secondary | ICD-10-CM

## 2016-09-24 DIAGNOSIS — M25511 Pain in right shoulder: Secondary | ICD-10-CM

## 2016-09-24 DIAGNOSIS — M25611 Stiffness of right shoulder, not elsewhere classified: Secondary | ICD-10-CM

## 2016-09-24 DIAGNOSIS — M6281 Muscle weakness (generalized): Secondary | ICD-10-CM

## 2016-09-24 DIAGNOSIS — R609 Edema, unspecified: Secondary | ICD-10-CM

## 2016-09-24 DIAGNOSIS — Z483 Aftercare following surgery for neoplasm: Secondary | ICD-10-CM | POA: Diagnosis not present

## 2016-09-24 NOTE — Therapy (Signed)
[place], [place], [address] Phone: [phone]   Fax:  [phone]  Physical Therapy Treatment  Patient Details  Name: Casey Olson MRN: [id number] Date of Birth: Feb 14, 1951 Referring Provider: Dr. Marlou Starks  Encounter Date: 09/24/2016      PT End of Session - 09/24/16 1215    Visit Number 3   Number of Visits 7   Date for PT Re-Evaluation 10/30/16   PT Start Time 1108   PT Stop Time 1154   PT Time Calculation (min) 46 min   Activity Tolerance Patient tolerated treatment well   Behavior During Therapy [facility] for tasks assessed/performed      Past Medical History:  Diagnosis Date  . Bronchitis   . Cancer [facility])    right breast  . Diabetes mellitus without complication ([place])    type 2  . GERD (gastroesophageal reflux disease)   . High cholesterol   . Hypertension     Past Surgical History:  Procedure Laterality Date  . ABDOMINAL HYSTERECTOMY    . COLONOSCOPY    . MASTECTOMY W/ SENTINEL NODE BIOPSY Right 08/26/2016   Procedure: RIGHT MASTECTOMY WITH SENTINEL LYMPH NODE BIOPSY;  Surgeon: Autumn Messing III, MD;  Location: [place];  Service: General;  Laterality: Right;  . PORTACATH PLACEMENT Left 08/26/2016   Procedure: INSERTION PORT-A-CATH;  Surgeon: Autumn Messing III, MD;  Location: [place];  Service: General;  Laterality: Left;  . TUBAL LIGATION    . WISDOM TOOTH EXTRACTION      There were no vitals filed for this visit.      Subjective Assessment - 09/24/16 1114    Subjective Pt says she has gotten some irritation from weraring the binder. She got her knitted knockers and has been wearing one of her old bras. He daughter will call [facility] for an appointment next week to look at compression bras or camisoles.  She is starting her chemotherapy tomorrow. She says she has some sorness in her left breast from her echocardiogram yesterday     Patient is accompained by: Family member  older  daughter [name]    Pertinent History Patient was diagnosed on 07/23/16 with right grade 3 invasive ductal carcinoma breast cancer.  It is located in the upper outer quadrant and measures 4.7 cm, is ER positive and PR negative, HEr2 negative and has a Ki67 of 90%.  Pt underwent an right mastectomy on 08/26/2017 they took out 17 lymph nodes with 1 positive. Past pressure includes DM, high blood pressure and high cholesterol  Pt will start chemo on Feb. 23, but hopes to not have radiation.    Patient Stated Goals to be able to move her arm like she used to    Currently in Pain? No/denies  at rest, but c/o pain in right arm with exercise                          [facility] Adult PT Treatment/Exercise - 09/24/16 0001      Shoulder Exercises: Supine   Protraction AROM;Right;10 reps   Protraction Limitations fatigue at end of 10 reps    External Rotation AROM;Right;5 reps   Flexion AAROM;Right;5 reps   Other Supine Exercises Active assisted ROM to right shoulder in diagonals within available range      Shoulder Exercises: Sidelying   External Rotation AROM;Right;5 reps   ABduction AAROM;Right;5 reps   ABduction Limitations difficulty due muscle weakness  Other Sidelying Exercises 5 small circles in each direction with arm pointing to ceilling. needed some assistance. to hold arm up and for control      Shoulder Exercises: Therapy Ball   Flexion 10 reps  manual and visual cues with mirror  to keep shoulder down      Shoulder Exercises: ROM/Strengthening   Wall Wash wall wash with folded towel into flexion with cues to keep shoulder down      Manual Therapy   Manual Therapy Scapular mobilization;Manual Lymphatic Drainage (MLD);Passive ROM   Scapular Mobilization gentle scapluar glides with encouragement for pt to move scapula back and down   Manual Lymphatic Drainage (MLD) brielfyin supine and sidelying  to anterior chest in interaxiallary anastamosis,  lateral trunk toward  inguinal nodes and back in posterior interaxillary anastamosis to reduce fullness at right axilla, lateral trunk and anterior chest    Passive ROM In Supine to Rt shoulder with HOB slightly elevated into flexion, abduction, er, and IR all to pts tolerance. She needed mod VC to relax throughout and was limited in tolerance to abduction.                PT Education - 09/24/16 1214    Education provided Yes   Education Details encouraged patient to do scapular retraction and depression, and continue dowel rod felxion exercises            Short Term Clinic Goals - 09/11/16 1150      CC Short Term Goal  #1   Title Patient with verbalize an understanding of lymphedema risk reduction precautions   Time 3   Period Weeks   Status New     CC Short Term Goal  #2   Title Patient will be independent in a basic  home exercise program           Breast Clinic Goals - 08/05/16 1135      Patient will be able to verbalize understanding of pertinent lymphedema risk reduction practices relevant to her diagnosis specifically related to skin care.   Time 1   Period Days   Status Achieved     Patient will be able to return demonstrate and/or verbalize understanding of the post-op home exercise program related to regaining shoulder range of motion.   Time 1   Period Days   Status Achieved     Patient will be able to verbalize understanding of the importance of attending the postoperative After Breast Cancer Class for further lymphedema risk reduction education and therapeutic exercise.   Time 1   Period Days   Status Achieved          Long Term Clinic Goals - 09/11/16 1151      CC Long Term Goal  #1   Title Patient will improve shoulder flexion range of motion to 150 degrees to perform activities of daily living and household chores with greater ease   Time 6   Period Weeks   Status New     CC Long Term Goal  #2   Title Patient will improve left shoulder abduction to 120  degrees so that she can be independent in dressing    Time 6   Period Weeks   Status New     CC Long Term Goal  #3   Title Patient will decrease the DASH score to < 38   to demonstrate increased functional use of upper extremity   Time 6   Period Weeks   Status New  Plan - 09/24/16 1216    Clinical Impression Statement Pt seemed a little "down" today with continued decreased range of motion and strength in right arm. She will start chemotherapy, but plans to schedule her PT visits the day before she has chemo so it should not interfere with her visits here.    Rehab Potential Good   Clinical Impairments Affecting Rehab Potential 17 nodes removed.  will have ongoing chemotherapy    PT Frequency 1x / week   PT Duration 6 weeks   PT Treatment/Interventions ADLs/Self Care Home Management;Patient/family education;Orthotic Fit/Training;Taping;Manual techniques;Manual lymph drainage;Compression bandaging;Passive range of motion;Neuromuscular re-education;Therapeutic exercise;Therapeutic activities;Functional mobility training   PT Next Visit Plan Remeasure ROM. Cont AA/A/P/ROM to right shoulder.  May need review of dowel exercises. neuromuscular training and stregnthening of scapular muscles with posture retraining. progress HEP  make sure pt signs up for ABC class , later Strength ABC program    PT Home Exercise Plan Post op shoulder ROM HEP   Consulted and Agree with Plan of Care Patient;Family member/caregiver   Family Member Consulted daughter [name] present for treatment       Patient will benefit from skilled therapeutic intervention in order to improve the following deficits and impairments:  Postural dysfunction, Decreased knowledge of precautions, Pain, Impaired UE functional use, Decreased range of motion, Decreased skin integrity, Increased edema, Decreased strength, Decreased knowledge of use of DME, Decreased activity tolerance  Visit Diagnosis: Aftercare following  surgery for neoplasm  Edema, unspecified type  Acute pain of right shoulder  Stiffness of right shoulder, not elsewhere classified  Muscle weakness (generalized)  Abnormal posture     Problem List Patient Active Problem List   Diagnosis Date Noted  . Malignant neoplasm of upper-outer quadrant of right breast in female, estrogen receptor positive ([place]) 08/05/2016  . Breast cancer of upper-outer quadrant of right female breast ([place]) 07/31/2016   Donato Heinz. Owens Shark PT  Norwood Levo 09/24/2016, 12:19 PM  [place], [place], [address] Phone: [phone]   Fax:  [phone]  Name: Casey Olson MRN: [id number] Date of Birth: February 10, 1951

## 2016-09-25 ENCOUNTER — Encounter: Payer: Self-pay | Admitting: Hematology and Oncology

## 2016-09-25 ENCOUNTER — Other Ambulatory Visit (HOSPITAL_BASED_OUTPATIENT_CLINIC_OR_DEPARTMENT_OTHER): Payer: Medicare Other

## 2016-09-25 ENCOUNTER — Ambulatory Visit (HOSPITAL_BASED_OUTPATIENT_CLINIC_OR_DEPARTMENT_OTHER): Payer: Medicare Other | Admitting: Hematology and Oncology

## 2016-09-25 ENCOUNTER — Ambulatory Visit (HOSPITAL_BASED_OUTPATIENT_CLINIC_OR_DEPARTMENT_OTHER): Payer: Medicare Other

## 2016-09-25 ENCOUNTER — Ambulatory Visit: Payer: Medicare Other

## 2016-09-25 ENCOUNTER — Encounter: Payer: Self-pay | Admitting: *Deleted

## 2016-09-25 DIAGNOSIS — Z170 Estrogen receptor positive status [ER+]: Secondary | ICD-10-CM

## 2016-09-25 DIAGNOSIS — C50411 Malignant neoplasm of upper-outer quadrant of right female breast: Secondary | ICD-10-CM

## 2016-09-25 DIAGNOSIS — Z95828 Presence of other vascular implants and grafts: Secondary | ICD-10-CM | POA: Insufficient documentation

## 2016-09-25 DIAGNOSIS — Z5111 Encounter for antineoplastic chemotherapy: Secondary | ICD-10-CM

## 2016-09-25 LAB — COMPREHENSIVE METABOLIC PANEL
ALBUMIN: 3.8 g/dL (ref 3.5–5.0)
ALK PHOS: 73 U/L (ref 40–150)
ALT: 11 U/L (ref 0–55)
ANION GAP: 11 meq/L (ref 3–11)
AST: 9 U/L (ref 5–34)
BILIRUBIN TOTAL: 0.38 mg/dL (ref 0.20–1.20)
BUN: 11.2 mg/dL (ref 7.0–26.0)
CO2: 24 meq/L (ref 22–29)
Calcium: 9.5 mg/dL (ref 8.4–10.4)
Chloride: 104 mEq/L (ref 98–109)
Creatinine: 0.7 mg/dL (ref 0.6–1.1)
Glucose: 247 mg/dl — ABNORMAL HIGH (ref 70–140)
POTASSIUM: 3.9 meq/L (ref 3.5–5.1)
Sodium: 138 mEq/L (ref 136–145)
Total Protein: 7.3 g/dL (ref 6.4–8.3)

## 2016-09-25 LAB — CBC WITH DIFFERENTIAL/PLATELET
BASO%: 0.4 % (ref 0.0–2.0)
BASOS ABS: 0 10*3/uL (ref 0.0–0.1)
EOS ABS: 0.5 10*3/uL (ref 0.0–0.5)
EOS%: 7 % (ref 0.0–7.0)
HCT: 35.5 % (ref 34.8–46.6)
HGB: 11.7 g/dL (ref 11.6–15.9)
LYMPH%: 25.3 % (ref 14.0–49.7)
MCH: 27.8 pg (ref 25.1–34.0)
MCHC: 33.1 g/dL (ref 31.5–36.0)
MCV: 84.2 fL (ref 79.5–101.0)
MONO#: 0.8 10*3/uL (ref 0.1–0.9)
MONO%: 12.4 % (ref 0.0–14.0)
NEUT%: 54.9 % (ref 38.4–76.8)
NEUTROS ABS: 3.7 10*3/uL (ref 1.5–6.5)
PLATELETS: 201 10*3/uL (ref 145–400)
RBC: 4.22 10*6/uL (ref 3.70–5.45)
RDW: 14.5 % (ref 11.2–14.5)
WBC: 6.8 10*3/uL (ref 3.9–10.3)
lymph#: 1.7 10*3/uL (ref 0.9–3.3)

## 2016-09-25 MED ORDER — SODIUM CHLORIDE 0.9 % IV SOLN
Freq: Once | INTRAVENOUS | Status: AC
  Administered 2016-09-25: 13:00:00 via INTRAVENOUS
  Filled 2016-09-25: qty 5

## 2016-09-25 MED ORDER — PALONOSETRON HCL INJECTION 0.25 MG/5ML
INTRAVENOUS | Status: AC
  Filled 2016-09-25: qty 5

## 2016-09-25 MED ORDER — DOXORUBICIN HCL CHEMO IV INJECTION 2 MG/ML
60.0000 mg/m2 | Freq: Once | INTRAVENOUS | Status: AC
  Administered 2016-09-25: 112 mg via INTRAVENOUS
  Filled 2016-09-25: qty 56

## 2016-09-25 MED ORDER — SODIUM CHLORIDE 0.9% FLUSH
10.0000 mL | INTRAVENOUS | Status: AC | PRN
  Administered 2016-09-25: 10 mL via INTRAVENOUS
  Filled 2016-09-25: qty 10

## 2016-09-25 MED ORDER — PEGFILGRASTIM 6 MG/0.6ML ~~LOC~~ PSKT
6.0000 mg | PREFILLED_SYRINGE | Freq: Once | SUBCUTANEOUS | Status: AC
  Administered 2016-09-25: 6 mg via SUBCUTANEOUS
  Filled 2016-09-25: qty 0.6

## 2016-09-25 MED ORDER — PALONOSETRON HCL INJECTION 0.25 MG/5ML
0.2500 mg | Freq: Once | INTRAVENOUS | Status: AC
  Administered 2016-09-25: 0.25 mg via INTRAVENOUS

## 2016-09-25 MED ORDER — HEPARIN SOD (PORK) LOCK FLUSH 100 UNIT/ML IV SOLN
500.0000 [IU] | Freq: Once | INTRAVENOUS | Status: AC | PRN
  Administered 2016-09-25: 500 [IU]
  Filled 2016-09-25: qty 5

## 2016-09-25 MED ORDER — SODIUM CHLORIDE 0.9 % IV SOLN
Freq: Once | INTRAVENOUS | Status: AC
  Administered 2016-09-25: 13:00:00 via INTRAVENOUS

## 2016-09-25 MED ORDER — SODIUM CHLORIDE 0.9% FLUSH
10.0000 mL | INTRAVENOUS | Status: DC | PRN
  Administered 2016-09-25: 10 mL
  Filled 2016-09-25: qty 10

## 2016-09-25 MED ORDER — SODIUM CHLORIDE 0.9 % IV SOLN
600.0000 mg/m2 | Freq: Once | INTRAVENOUS | Status: AC
  Administered 2016-09-25: 1120 mg via INTRAVENOUS
  Filled 2016-09-25: qty 56

## 2016-09-25 NOTE — Progress Notes (Signed)
Patient Care Team: Nolene Ebbs, MD as PCP - General (Internal Medicine) Autumn Messing III, MD as Consulting Physician (General Surgery) Nicholas Lose, MD as Consulting Physician (Hematology and Oncology) Kyung Rudd, MD as Consulting Physician (Radiation Oncology)  DIAGNOSIS:  Encounter Diagnosis  Name Primary?  . Malignant neoplasm of upper-outer quadrant of right breast in female, estrogen receptor positive ([place])     SUMMARY OF ONCOLOGIC HISTORY:   Breast cancer of upper-outer quadrant of right female breast ([place])   07/24/2016 Initial Diagnosis    Palpable right breast mass for 2 months UOQ at 10:00: 4.7 x 4.1 x 3.7 cm with enlarged axillary lymph node biopsy benign, breast mass biopsy grade 3 IDC ER 30% week PR negative HER-2 negative Ki-67 90%       Chemotherapy    Dose dense Adriamycin and Cytoxan 4 followed by weekly Abraxane 12      08/26/2016 Surgery    Right mastectomy: IDC grade 3, 7 cm, margins negative, 1/17 nodes positive, ER weakly +30%, PR negative, HER-2 negative, Ki-67 90%, T3 N1 stage IIIa      09/25/2016 -  Chemotherapy     Dose dense Adriamycin Cytoxan 4 followed y weelyAbraxane 12        CHIEF COMPLIANT:  Cycle 1 day 1 dose dense Adriamycin Cytoxan  INTERVAL HISTORY: Casey Olson is a  66 year old with above-mentioned history of right breast cancer treated with mastectmy and is here today to receive first cycle of adjuavnt chemotherapy with dose dense Adriamycin and Cytoxan. She has healed well from prior surgery.  REVIEW OF SYSTEMS:   Constitutional: Denies fevers, chills or abnormal weight loss Eyes: Denies blurriness of vision Ears, nose, mouth, throat, and face: Denies mucositis or sore throat Respiratory: Denies cough, dyspnea or wheezes Cardiovascular: Denies palpitation, chest discomfort Gastrointestinal:  Denies nausea, heartburn or change in bowel habits Skin: Denies abnormal skin rashes Lymphatics: Denies new lymphadenopathy or easy  bruising Neurological:Denies numbness, tingling or new weaknesses Behavioral/Psych: Mood is stable, no new changes  Extremities: No lower extremity edema Breast:  denies any pain or lumps or nodules in either breasts All other systems were reviewed with the patient and are negative.  I have reviewed the past medical history, past surgical history, social history and family history with the patient and they are unchanged from previous note.  ALLERGIES:  is allergic to percocet [oxycodone-acetaminophen].  MEDICATIONS:  Current Outpatient Prescriptions  Medication Sig Dispense Refill  . acetaminophen (TYLENOL) 325 MG tablet Take 650 mg by mouth every 6 (six) hours as needed for mild pain.    Marland Kitchen aspirin EC 81 MG tablet Take 81 mg by mouth daily.    . Blood Glucose Monitoring Suppl (ONETOUCH VERIO FLEX SYSTEM) w/Device KIT USE TO TEST 3 TIMES DAILY DX E11.65  11  . Calcium Carbonate-Vitamin D (CALTRATE 600+D PO) Take 1 tablet by mouth 2 (two) times daily.    Marland Kitchen gabapentin (NEURONTIN) 100 MG capsule Take 1 capsule (100 mg total) by mouth 3 (three) times daily. 60 capsule 2  . Hyprom-Naphaz-Polysorb-Zn Sulf (CLEAR EYES COMPLETE OP) Apply 2 drops to eye 2 (two) times daily as needed (irritation).    Marland Kitchen LANTUS SOLOSTAR 100 UNIT/ML Solostar Pen INJECT 20 UNITS IN THE MORNING  5  . lidocaine-prilocaine (EMLA) cream Apply to affected area once (Patient not taking: Reported on 08/18/2016) 30 g 3  . LORazepam (ATIVAN) 0.5 MG tablet Take 1 tablet (0.5 mg total) by mouth every 6 (six) hours as needed (Nausea or  vomiting). (Patient not taking: Reported on 09/16/2016) 30 tablet 0  . losartan (COZAAR) 50 MG tablet Take 50 mg by mouth daily.  5  . metFORMIN (GLUCOPHAGE) 1000 MG tablet Take 1,000 mg by mouth 2 (two) times daily.  5  . Multiple Vitamin (MULTIVITAMIN WITH MINERALS) TABS tablet Take 1 tablet by mouth daily.    . ondansetron (ZOFRAN) 8 MG tablet Take 1 tablet (8 mg total) by mouth 2 (two) times daily as  needed. Start on the third day after chemotherapy. (Patient not taking: Reported on 08/18/2016) 30 tablet 1  . ONETOUCH DELICA LANCETS FINE MISC USE TO TEST 3 TIMES DAILY DX E11.65  11  . ONETOUCH VERIO test strip USE TO TEST 3 TIMES DAILY DX E11.65  11  . prochlorperazine (COMPAZINE) 10 MG tablet Take 1 tablet (10 mg total) by mouth every 6 (six) hours as needed (Nausea or vomiting). (Patient not taking: Reported on 08/18/2016) 30 tablet 1  . simvastatin (ZOCOR) 10 MG tablet Take 10 mg by mouth daily at 6 PM.   2   No current facility-administered medications for this visit.    Facility-Administered Medications Ordered in Other Visits  Medication Dose Route Frequency Provider Last Rate Last Dose  . cyclophosphamide (CYTOXAN) 1,120 mg in sodium chloride 0.9 % 250 mL chemo infusion  600 mg/m2 (Treatment Plan Recorded) Intravenous Once Nicholas Lose, MD      . DOXOrubicin (ADRIAMYCIN) chemo injection 112 mg  60 mg/m2 (Treatment Plan Recorded) Intravenous Once Nicholas Lose, MD      . heparin lock flush 100 unit/mL  500 Units Intracatheter Once PRN Nicholas Lose, MD      . pegfilgrastim (NEULASTA ONPRO KIT) injection 6 mg  6 mg Subcutaneous Once Nicholas Lose, MD      . sodium chloride flush (NS) 0.9 % injection 10 mL  10 mL Intravenous PRN Nicholas Lose, MD   10 mL at 09/25/16 1112  . sodium chloride flush (NS) 0.9 % injection 10 mL  10 mL Intracatheter PRN Nicholas Lose, MD        PHYSICAL EXAMINATION: ECOG PERFORMANCE STATUS: 2 - Symptomatic, <50% confined to bed  Vitals:   09/25/16 1130  BP: (!) 168/70  Pulse: 84  Resp: 16  Temp: 98.1 F (36.7 C)   Filed Weights   09/25/16 1130  Weight: 160 lb 11.2 oz (72.9 kg)    GENERAL:alert, no distress and comfortable SKIN: skin color, texture, turgor are normal, no rashes or significant lesions EYES: normal, Conjunctiva are pink and non-injected, sclera clear OROPHARYNX:no exudate, no erythema and lips, buccal mucosa, and tongue normal  NECK:  supple, thyroid normal size, non-tender, without nodularity LYMPH:  no palpable lymphadenopathy in the cervical, axillary or inguinal LUNGS: clear to auscultation and percussion with normal breathing effort HEART: regular rate & rhythm and no murmurs and no lower extremity edema ABDOMEN:abdomen soft, non-tender and normal bowel sounds MUSCULOSKELETAL:no cyanosis of digits and no clubbing  NEURO: alert & oriented x 3 with fluent speech, no focal motor/sensory deficits EXTREMITIES: No lower extremity edema BREAST: No palpable masses or nodules in either right or left breasts. No palpable axillary supraclavicular or infraclavicular adenopathy no breast tenderness or nipple discharge. (exam performed in the presence of a chaperone)  LABORATORY DATA:  I have reviewed the data as listed   Chemistry      Component Value Date/Time   NA 138 09/25/2016 1054   K 3.9 09/25/2016 1054   CL 101 08/21/2016 1333   CO2  24 09/25/2016 1054   BUN 11.2 09/25/2016 1054   CREATININE 0.7 09/25/2016 1054      Component Value Date/Time   CALCIUM 9.5 09/25/2016 1054   ALKPHOS 73 09/25/2016 1054   AST 9 09/25/2016 1054   ALT 11 09/25/2016 1054   BILITOT 0.38 09/25/2016 1054       Lab Results  Component Value Date   WBC 6.8 09/25/2016   HGB 11.7 09/25/2016   HCT 35.5 09/25/2016   MCV 84.2 09/25/2016   PLT 201 09/25/2016   NEUTROABS 3.7 09/25/2016    ASSESSMENT & PLAN:  Breast cancer of upper-outer quadrant of right female breast ([place]) 07/24/2016: Palpable right breast mass for 2 months UOQ at 10:00: 4.7 x 4.1 x 3.7 cm with enlarged axillary lymph node biopsy benign, breast mass biopsy grade 3 IDC ER 30% week PR negative HER-2 negative Ki-67 90% CT CAP and Bone scan: No mets  Treatment plan based on multidisciplinary tumor board: 1. Right mastectomy 08/26/2016: IDC grade 3, 7 cm, margins negative, 1/17 nodes positive, ER weakly +30%, PR negative, HER-2 negative, Ki-67 90%, T3 N1 stage IIIa 2.  Adjuvant chemotherapy with Adriamycin and Cytoxan dose dense 4 followed by Abraxane weekly 12 (Patient cannot state steroids due to diabetes and so we are not using Taxol) 3. Followed by adjuvant radiation therapy 4. Followed by adjuvant antiestrogen therapy -------------------------------------------------------------------------------------------------------------------------- Current treatment: Cycle 1 day 1 dose dense Adriamycin Cytoxan Antiemetics were reviewed Chemotherapy consent obtained Chemotherapy education completed Echocardiogram 09/16/2016: EF 60-65 % Closely monitoring for chemotherapy toxicities. Return to clinic in one week for toxicity check  I spent 25 minutes talking to the patient of which more than half was spent in counseling and coordination of care.  No orders of the defined types were placed in this encounter.  The patient has a good understanding of the overall plan. she agrees with it. she will call with any problems that may develop before the next visit here.   Rulon Eisenmenger, MD 09/25/16

## 2016-09-25 NOTE — Progress Notes (Signed)
Pt is approved for the $1000 [employer] grant.  

## 2016-09-25 NOTE — Assessment & Plan Note (Signed)
07/24/2016: Palpable right breast mass for 2 months UOQ at 10:00: 4.7 x 4.1 x 3.7 cm with enlarged axillary lymph node biopsy benign, breast mass biopsy grade 3 IDC ER 30% week PR negative HER-2 negative Ki-67 90% CT CAP and Bone scan: No mets  Treatment plan based on multidisciplinary tumor board: 1. Right mastectomy 08/26/2016: IDC grade 3, 7 cm, margins negative, 1/17 nodes positive, ER weakly +30%, PR negative, HER-2 negative, Ki-67 90%, T3 N1 stage IIIa 2. Adjuvant chemotherapy with Adriamycin and Cytoxan dose dense 4 followed by Abraxane weekly 12 (Patient cannot state steroids due to diabetes and so we are not using Taxol) 3. Followed by adjuvant radiation therapy 4. Followed by adjuvant antiestrogen therapy -------------------------------------------------------------------------------------------------------------------------- Current treatment: Cycle 1 day 1 dose dense Adriamycin Cytoxan Antiemetics were reviewed Chemotherapy consent obtained Chemotherapy education completed Echocardiogram 09/16/2016: EF 60-65 % Closely monitoring for chemotherapy toxicities. Return to clinic in one week for toxicity check

## 2016-09-25 NOTE — Patient Instructions (Signed)
[facility] Discharge Instructions for Patients Receiving Chemotherapy  Today you received the following chemotherapy agents Adriamycin and Cytoxan.   To help prevent nausea and vomiting after your treatment, we encourage you to take your nausea medication.   If you develop nausea and vomiting that is not controlled by your nausea medication, call the clinic.   BELOW ARE SYMPTOMS THAT SHOULD BE REPORTED IMMEDIATELY:  *FEVER GREATER THAN 100.5 F  *CHILLS WITH OR WITHOUT FEVER  NAUSEA AND VOMITING THAT IS NOT CONTROLLED WITH YOUR NAUSEA MEDICATION  *UNUSUAL SHORTNESS OF BREATH  *UNUSUAL BRUISING OR BLEEDING  TENDERNESS IN MOUTH AND THROAT WITH OR WITHOUT PRESENCE OF ULCERS  *URINARY PROBLEMS  *BOWEL PROBLEMS  UNUSUAL RASH Items with * indicate a potential emergency and should be followed up as soon as possible.  Feel free to call the clinic you have any questions or concerns. The clinic phone number is [phone].  Please show the CHEMO ALERT CARD at check-in to the Emergency Department and triage nurse.   

## 2016-10-01 ENCOUNTER — Ambulatory Visit: Payer: Medicare Other | Attending: General Surgery

## 2016-10-01 DIAGNOSIS — M6281 Muscle weakness (generalized): Secondary | ICD-10-CM | POA: Diagnosis present

## 2016-10-01 DIAGNOSIS — M25611 Stiffness of right shoulder, not elsewhere classified: Secondary | ICD-10-CM | POA: Insufficient documentation

## 2016-10-01 DIAGNOSIS — Z483 Aftercare following surgery for neoplasm: Secondary | ICD-10-CM | POA: Insufficient documentation

## 2016-10-01 DIAGNOSIS — R293 Abnormal posture: Secondary | ICD-10-CM | POA: Diagnosis present

## 2016-10-01 DIAGNOSIS — R609 Edema, unspecified: Secondary | ICD-10-CM | POA: Diagnosis present

## 2016-10-01 DIAGNOSIS — M25511 Pain in right shoulder: Secondary | ICD-10-CM | POA: Insufficient documentation

## 2016-10-01 NOTE — Therapy (Signed)
[facility] [address] [place], [place], [address] Phone: [phone]   Fax:  [phone]  Physical Therapy Treatment  Patient Details  Name: Casey Olson MRN: [id number] Date of Birth: 01/17/1951 Referring Provider: Dr. Toth  Encounter Date: 10/01/2016      PT End of Session - 10/01/16 1152    Visit Number 4   Number of Visits 7   Date for PT Re-Evaluation 10/30/16   PT Start Time 1104   PT Stop Time 1148   PT Time Calculation (min) 44 min   Activity Tolerance Patient tolerated treatment well   Behavior During Therapy WFL for tasks assessed/performed      Past Medical History:  Diagnosis Date  . Bronchitis   . Cancer (HCC)    right breast  . Diabetes mellitus without complication (HCC)    type 2  . GERD (gastroesophageal reflux disease)   . High cholesterol   . Hypertension     Past Surgical History:  Procedure Laterality Date  . ABDOMINAL HYSTERECTOMY    . COLONOSCOPY    . MASTECTOMY W/ SENTINEL NODE BIOPSY Right 08/26/2016   Procedure: RIGHT MASTECTOMY WITH SENTINEL LYMPH NODE BIOPSY;  Surgeon: Paul Toth III, MD;  Location: [facility];  Service: General;  Laterality: Right;  . PORTACATH PLACEMENT Left 08/26/2016   Procedure: INSERTION PORT-A-CATH;  Surgeon: Paul Toth III, MD;  Location: [facility] OR;  Service: General;  Laterality: Left;  . TUBAL LIGATION    . WISDOM TOOTH EXTRACTION      There were no vitals filed for this visit.      Subjective Assessment - 10/01/16 1107    Subjective Tried calling [facility] but they were on vacation or something so I couldn't get an appointment yet. I had my chemo treatment last Friday and I was out of it over the weekend and didn't do my exercises much. But I started back a little over the past few days, mostly the pendulum exercises. I have noticed I can reach to the side better now.   Pertinent History Patient was diagnosed on 07/23/16 with right grade 3 invasive  ductal carcinoma breast cancer.  It is located in the upper outer quadrant and measures 4.7 cm, is ER positive and PR negative, HEr2 negative and has a Ki67 of 90%.  Pt underwent an right mastectomy on 08/26/2017 they took out 17 lymph nodes with 1 positive. Past pressure includes DM, high blood pressure and high cholesterol  Pt will start chemo on Feb. 23, but hopes to not have radiation.    Patient Stated Goals to be able to move her arm like she used to    Currently in Pain? No/denies            [facility] PT Assessment - 10/01/16 0001      AROM   Right Shoulder Flexion 88 Degrees   Right Shoulder ABduction 87 Degrees                     [facility] Adult PT Treatment/Exercise - 10/01/16 0001      Shoulder Exercises: Pulleys   Flexion 2 minutes   ABduction 2 minutes   ABduction Limitations VC to decrease scapular compensations     Shoulder Exercises: Therapy Ball   Flexion 10 reps  With forward lean into top of stretch   Flexion Limitations Pt did much better with decreasing scapular compensation on Rt side today.     Manual Therapy     Manual Therapy Scapular mobilization;Manual Lymphatic Drainage (MLD);Passive ROM   Scapular Mobilization In Supine into retraction and protraction, then in Lt S/L for retraction/protraction and then depression with P/ROM flexion of UE with VC throughout for pt to relax.   Manual Lymphatic Drainage (MLD) Briefly in supine: Rt inguinal and Lt axillary nodes, then Rt axillo-inguinal and anterior anastomosis, anterior chest, lateral trunk; then in Lt S/L for posterior inter-axillary anastamosis to reduce fullness at right axilla, lateral trunk and anterior chest    Passive ROM In Supine to Rt shoulder with HOB slightly elevated into flexion, abduction, and er all to pts tolerance. She needed mod VC to relax throughout and was limited in tolerance to abduction.                   Short Term Clinic Goals - 09/11/16 1150      CC Short Term  Goal  #1   Title Patient with verbalize an understanding of lymphedema risk reduction precautions   Time 3   Period Weeks   Status New     CC Short Term Goal  #2   Title Patient will be independent in a basic  home exercise program           Long Term Clinic Goals - 09/11/16 1151      CC Long Term Goal  #1   Title Patient will improve shoulder flexion range of motion to 150 degrees to perform activities of daily living and household chores with greater ease   Time 6   Period Weeks   Status New     CC Long Term Goal  #2   Title Patient will improve left shoulder abduction to 120 degrees so that she can be independent in dressing    Time 6   Period Weeks   Status New     CC Long Term Goal  #3   Title Patient will decrease the DASH score to < 38   to demonstrate increased functional use of upper extremity   Time 6   Period Weeks   Status New            Plan - 10/01/16 1153    Clinical Impression Statement Pt was feeling better today than at last session. Her A/ROM has improved by at least 10 degrees in flexion and abduction and she reports noticing she can reach to side with greater ease now as well. Though she still struggles with relaxing during P/ROM, improvement is noted by when this therapist last saw her. She reports feeling good after session today.    Rehab Potential Good   Clinical Impairments Affecting Rehab Potential 17 nodes removed.  will have ongoing chemotherapy    PT Frequency 1x / week   PT Duration 6 weeks   PT Treatment/Interventions ADLs/Self Care Home Management;Patient/family education;Orthotic Fit/Training;Taping;Manual techniques;Manual lymph drainage;Compression bandaging;Passive range of motion;Neuromuscular re-education;Therapeutic exercise;Therapeutic activities;Functional mobility training   PT Next Visit Plan Remeasure ROM. Cont AA/A/P/ROM to right shoulder.  Possibly instruct pt in supine scapular series with yellow theraband next visit. May  need review of dowel exercises. neuromuscular training.  Make sure pt signs up for ABC class , later Strength ABC program    Consulted and Agree with Plan of Care Patient      Patient will benefit from skilled therapeutic intervention in order to improve the following deficits and impairments:  Postural dysfunction, Decreased knowledge of precautions, Pain, Impaired UE functional use, Decreased range of motion, Decreased skin integrity, Increased  edema, Decreased strength, Decreased knowledge of use of DME, Decreased activity tolerance  Visit Diagnosis: Aftercare following surgery for neoplasm  Edema, unspecified type  Acute pain of right shoulder  Stiffness of right shoulder, not elsewhere classified  Muscle weakness (generalized)  Abnormal posture     Problem List Patient Active Problem List   Diagnosis Date Noted  . Port catheter in place 09/25/2016  . Malignant neoplasm of upper-outer quadrant of right breast in female, estrogen receptor positive ([place]) 08/05/2016  . Breast cancer of upper-outer quadrant of right female breast ([place]) 07/31/2016    Otelia Limes, PTA 10/01/2016, 11:57 AM  [place], [place], [address] Phone: [phone]   Fax:  [phone]  Name: Hanalei Glace MRN: [id number] Date of Birth: 09/03/1950

## 2016-10-02 ENCOUNTER — Ambulatory Visit (HOSPITAL_BASED_OUTPATIENT_CLINIC_OR_DEPARTMENT_OTHER): Payer: Medicare Other | Admitting: Adult Health

## 2016-10-02 ENCOUNTER — Other Ambulatory Visit (HOSPITAL_BASED_OUTPATIENT_CLINIC_OR_DEPARTMENT_OTHER): Payer: Medicare Other

## 2016-10-02 ENCOUNTER — Encounter: Payer: Self-pay | Admitting: Adult Health

## 2016-10-02 VITALS — BP 134/54 | HR 101 | Temp 97.4°F | Resp 17 | Wt 157.5 lb

## 2016-10-02 DIAGNOSIS — Z170 Estrogen receptor positive status [ER+]: Secondary | ICD-10-CM

## 2016-10-02 DIAGNOSIS — E119 Type 2 diabetes mellitus without complications: Secondary | ICD-10-CM

## 2016-10-02 DIAGNOSIS — C50411 Malignant neoplasm of upper-outer quadrant of right female breast: Secondary | ICD-10-CM

## 2016-10-02 LAB — CBC WITH DIFFERENTIAL/PLATELET
BASO%: 0.3 % (ref 0.0–2.0)
BASOS ABS: 0 10*3/uL (ref 0.0–0.1)
EOS%: 6.3 % (ref 0.0–7.0)
Eosinophils Absolute: 0.3 10*3/uL (ref 0.0–0.5)
HCT: 36.1 % (ref 34.8–46.6)
HGB: 12 g/dL (ref 11.6–15.9)
LYMPH%: 26.8 % (ref 14.0–49.7)
MCH: 27.9 pg (ref 25.1–34.0)
MCHC: 33.1 g/dL (ref 31.5–36.0)
MCV: 84.4 fL (ref 79.5–101.0)
MONO#: 0.7 10*3/uL (ref 0.1–0.9)
MONO%: 14.2 % — AB (ref 0.0–14.0)
NEUT#: 2.7 10*3/uL (ref 1.5–6.5)
NEUT%: 52.4 % (ref 38.4–76.8)
Platelets: 164 10*3/uL (ref 145–400)
RBC: 4.28 10*6/uL (ref 3.70–5.45)
RDW: 14 % (ref 11.2–14.5)
WBC: 5.2 10*3/uL (ref 3.9–10.3)
lymph#: 1.4 10*3/uL (ref 0.9–3.3)

## 2016-10-02 LAB — COMPREHENSIVE METABOLIC PANEL
ALT: 11 U/L (ref 0–55)
AST: 11 U/L (ref 5–34)
Albumin: 3.9 g/dL (ref 3.5–5.0)
Alkaline Phosphatase: 105 U/L (ref 40–150)
Anion Gap: 10 mEq/L (ref 3–11)
BUN: 13.7 mg/dL (ref 7.0–26.0)
CALCIUM: 10 mg/dL (ref 8.4–10.4)
CO2: 27 mEq/L (ref 22–29)
Chloride: 103 mEq/L (ref 98–109)
Creatinine: 0.7 mg/dL (ref 0.6–1.1)
EGFR: >90 mL/min/{1.73_m2} (ref >90)
Glucose: 114 mg/dl (ref 70–140)
POTASSIUM: 3.7 meq/L (ref 3.5–5.1)
SODIUM: 140 meq/L (ref 136–145)
Total Bilirubin: 0.43 mg/dL (ref 0.20–1.20)
Total Protein: 7.6 g/dL (ref 6.4–8.3)

## 2016-10-02 NOTE — Progress Notes (Signed)
Patient Care Team: Nolene Ebbs, MD as PCP - General (Internal Medicine) Autumn Messing III, MD as Consulting Physician (General Surgery) Nicholas Lose, MD as Consulting Physician (Hematology and Oncology) Kyung Rudd, MD as Consulting Physician (Radiation Oncology) Minette Headland, NP as Nurse Practitioner (Hematology and Oncology)  DIAGNOSIS:  Encounter Diagnosis  Name Primary?  . Malignant neoplasm of upper-outer quadrant of right breast in female, estrogen receptor positive ([place]) Yes    SUMMARY OF ONCOLOGIC HISTORY:   Breast cancer of upper-outer quadrant of right female breast ([place])   07/24/2016 Initial Diagnosis    Palpable right breast mass for 2 months UOQ at 10:00: 4.7 x 4.1 x 3.7 cm with enlarged axillary lymph node biopsy benign, breast mass biopsy grade 3 IDC ER 30% week PR negative HER-2 negative Ki-67 90%       Chemotherapy    Dose dense Adriamycin and Cytoxan 4 followed by weekly Abraxane 12      08/26/2016 Surgery    Right mastectomy: IDC grade 3, 7 cm, margins negative, 1/17 nodes positive, ER weakly +30%, PR negative, HER-2 negative, Ki-67 90%, T3 N1 stage IIIa      09/25/2016 -  Chemotherapy     Dose dense Adriamycin Cytoxan 4 followed y weelyAbraxane 12        CHIEF COMPLIANT:  Cycle 1 day 8 dose dense Adriamycin Cytoxan  INTERVAL HISTORY: Casey Olson is a  66 year old with above-mentioned history of right breast cancer treated with mastectmy and is here today after receiving her first cycle of Adriamycin and Cytoxan.  She had some slight nausea not requiring any medication until 3-4 days following her treatment.  She has had a difficult time eating, but thinks this is due to the spices that have been put on her foods this week.    [name] has PMH of diabetes and her sugars have been ranging from 80-110 when she has checked them this week.    REVIEW OF SYSTEMS:   Constitutional: Denies fevers, chills or abnormal weight loss Eyes: Denies blurriness  of vision Ears, nose, mouth, throat, and face: Denies mucositis or sore throat Respiratory: Denies cough, dyspnea or wheezes Cardiovascular: Denies palpitation, chest discomfort Gastrointestinal:  Denies nausea, heartburn or change in bowel habits Skin: Denies abnormal skin rashes Lymphatics: Denies new lymphadenopathy or easy bruising Neurological:Denies numbness, tingling or new weaknesses Behavioral/Psych: Mood is stable, no new changes  Extremities: No lower extremity edema Breast:  denies any pain or lumps or nodules in either breasts All other systems were reviewed with the patient and are negative.  I have reviewed the past medical history, past surgical history, social history and family history with the patient and they are unchanged from previous note.  ALLERGIES:  is allergic to percocet [oxycodone-acetaminophen].  MEDICATIONS:  Current Outpatient Prescriptions  Medication Sig Dispense Refill  . acetaminophen (TYLENOL) 325 MG tablet Take 650 mg by mouth every 6 (six) hours as needed for mild pain.    Marland Kitchen aspirin EC 81 MG tablet Take 81 mg by mouth daily.    . Blood Glucose Monitoring Suppl (ONETOUCH VERIO FLEX SYSTEM) w/Device KIT USE TO TEST 3 TIMES DAILY DX E11.65  11  . Calcium Carbonate-Vitamin D (CALTRATE 600+D PO) Take 1 tablet by mouth 2 (two) times daily.    Marland Kitchen gabapentin (NEURONTIN) 100 MG capsule Take 1 capsule (100 mg total) by mouth 3 (three) times daily. 60 capsule 2  . Hyprom-Naphaz-Polysorb-Zn Sulf (CLEAR EYES COMPLETE OP) Apply 2 drops to eye 2 (two)  times daily as needed (irritation).    Marland Kitchen LANTUS SOLOSTAR 100 UNIT/ML Solostar Pen INJECT 20 UNITS IN THE MORNING  5  . LORazepam (ATIVAN) 0.5 MG tablet Take 1 tablet (0.5 mg total) by mouth every 6 (six) hours as needed (Nausea or vomiting). 30 tablet 0  . losartan (COZAAR) 50 MG tablet Take 50 mg by mouth daily.  5  . metFORMIN (GLUCOPHAGE) 1000 MG tablet Take 1,000 mg by mouth 2 (two) times daily.  5  . Multiple  Vitamin (MULTIVITAMIN WITH MINERALS) TABS tablet Take 1 tablet by mouth daily.    . ondansetron (ZOFRAN) 8 MG tablet Take 1 tablet (8 mg total) by mouth 2 (two) times daily as needed. Start on the third day after chemotherapy. 30 tablet 1  . ONETOUCH DELICA LANCETS FINE MISC USE TO TEST 3 TIMES DAILY DX E11.65  11  . ONETOUCH VERIO test strip USE TO TEST 3 TIMES DAILY DX E11.65  11  . simvastatin (ZOCOR) 10 MG tablet Take 10 mg by mouth daily at 6 PM.   2  . lidocaine-prilocaine (EMLA) cream Apply to affected area once (Patient not taking: Reported on 08/18/2016) 30 g 3  . prochlorperazine (COMPAZINE) 10 MG tablet Take 1 tablet (10 mg total) by mouth every 6 (six) hours as needed (Nausea or vomiting). (Patient not taking: Reported on 08/18/2016) 30 tablet 1   No current facility-administered medications for this visit.    Facility-Administered Medications Ordered in Other Visits  Medication Dose Route Frequency Provider Last Rate Last Dose  . sodium chloride flush (NS) 0.9 % injection 10 mL  10 mL Intravenous PRN Nicholas Lose, MD   10 mL at 09/25/16 1112    PHYSICAL EXAMINATION: ECOG PERFORMANCE STATUS: 2 - Symptomatic, <50% confined to bed  Vitals:   10/02/16 1015  BP: (!) 134/54  Pulse: (!) 101  Resp: 17  Temp: 97.4 F (36.3 C)   Filed Weights   10/02/16 1015  Weight: 157 lb 8 oz (71.4 kg)   GENERAL: Patient is a well appearing female in no acute distress HEENT:  Sclerae anicteric.  Oropharynx clear and moist. No ulcerations or evidence of oropharyngeal candidiasis. Neck is supple.  NODES:  No cervical, supraclavicular, or axillary lymphadenopathy palpated.  BREAST EXAM:  Deferred. LUNGS:  Clear to auscultation bilaterally.  No wheezes or rhonchi. HEART:  Regular rate and rhythm. No murmur appreciated. ABDOMEN:  Soft, nontender.  Positive, normoactive bowel sounds. No organomegaly palpated. MSK:  No focal spinal tenderness to palpation. Decreased ROM in right shoulder (patient  undergoing physical therapy) EXTREMITIES:  No peripheral edema.   SKIN:  Clear with no obvious rashes or skin changes. No nail dyscrasia. NEURO:  Nonfocal. Well oriented.  Appropriate affect.    LABORATORY DATA:  I have reviewed the data as listed   Chemistry      Component Value Date/Time   NA 140 10/02/2016 1004   K 3.7 10/02/2016 1004   CL 101 08/21/2016 1333   CO2 27 10/02/2016 1004   BUN 13.7 10/02/2016 1004   CREATININE 0.7 10/02/2016 1004      Component Value Date/Time   CALCIUM 10.0 10/02/2016 1004   ALKPHOS 105 10/02/2016 1004   AST 11 10/02/2016 1004   ALT 11 10/02/2016 1004   BILITOT 0.43 10/02/2016 1004       Lab Results  Component Value Date   WBC 5.2 10/02/2016   HGB 12.0 10/02/2016   HCT 36.1 10/02/2016   MCV 84.4 10/02/2016  PLT 164 10/02/2016   NEUTROABS 2.7 10/02/2016    ASSESSMENT & PLAN:  Breast cancer of upper-outer quadrant of right female breast ([place]) 07/24/2016: Palpable right breast mass for 2 months UOQ at 10:00: 4.7 x 4.1 x 3.7 cm with enlarged axillary lymph node biopsy benign, breast mass biopsy grade 3 IDC ER 30% week PR negative HER-2 negative Ki-67 90% CT CAP and Bone scan: No mets  Treatment plan based on multidisciplinary tumor board: 1. Right mastectomy 08/26/2016: IDC grade 3, 7 cm, margins negative, 1/17 nodes positive, ER weakly +30%, PR negative, HER-2 negative, Ki-67 90%, T3 N1 stage IIIa 2. Adjuvant chemotherapy with Adriamycin and Cytoxan dose dense 4 followed by Abraxane weekly 12 (Patient cannot state steroids due to diabetes and so we are not using Taxol) 3. Followed by adjuvant radiation therapy 4. Followed by adjuvant antiestrogen therapy -------------------------------------------------------------------------------------------------------------------------- Current treatment: Cycle 1 day 8 dose dense Adriamycin Cytoxan Echocardiogram 09/16/2016: EF 60-65 % Patient tolerated treatment well.  Her labs are normal  today.  She was recommended to eat bland foods. We reviewed her overall chemotherapy plan today, along with fatigue, and remedies for fatigue, as it may accumulate.  To see Annamary Rummage today following our appointment RTC in one week for labs, evaluation by Dr. Lindi Adie, and cycle 2 of Adriamycin/Cytoxan.  I spent 25 minutes talking to the patient of which more than half was spent in counseling and coordination of care.   The patient has a good understanding of the overall plan. she agrees with it. she will call with any problems that may develop before the next visit here.   Charlestine Massed, NP 10/02/16

## 2016-10-08 ENCOUNTER — Ambulatory Visit: Payer: Medicare Other

## 2016-10-08 DIAGNOSIS — R609 Edema, unspecified: Secondary | ICD-10-CM

## 2016-10-08 DIAGNOSIS — M25611 Stiffness of right shoulder, not elsewhere classified: Secondary | ICD-10-CM

## 2016-10-08 DIAGNOSIS — M6281 Muscle weakness (generalized): Secondary | ICD-10-CM

## 2016-10-08 DIAGNOSIS — Z483 Aftercare following surgery for neoplasm: Secondary | ICD-10-CM | POA: Diagnosis not present

## 2016-10-08 DIAGNOSIS — R293 Abnormal posture: Secondary | ICD-10-CM

## 2016-10-08 DIAGNOSIS — M25511 Pain in right shoulder: Secondary | ICD-10-CM

## 2016-10-08 NOTE — Therapy (Signed)
[place], [place], [address] Phone: [phone]   Fax:  [phone]  Physical Therapy Treatment  Patient Details  Name: Casey Olson MRN: [id number] Date of Birth: [birth year] Referring Provider: Dr. Marlou Starks  Encounter Date: 10/08/2016      PT End of Session - 10/08/16 1153    Visit Number 5   Number of Visits 7   Date for PT Re-Evaluation 10/30/16   PT Start Time 1057   PT Stop Time 1145   PT Time Calculation (min) 48 min   Activity Tolerance Patient tolerated treatment well   Behavior During Therapy [facility] for tasks assessed/performed      Past Medical History:  Diagnosis Date  . Bronchitis   . Cancer [facility])    right breast  . Diabetes mellitus without complication ([place])    type 2  . GERD (gastroesophageal reflux disease)   . High cholesterol   . Hypertension     Past Surgical History:  Procedure Laterality Date  . ABDOMINAL HYSTERECTOMY    . COLONOSCOPY    . MASTECTOMY W/ SENTINEL NODE BIOPSY Right 08/26/2016   Procedure: RIGHT MASTECTOMY WITH SENTINEL LYMPH NODE BIOPSY;  Surgeon: Autumn Messing III, MD;  Location: [facility];  Service: General;  Laterality: Right;  . PORTACATH PLACEMENT Left 08/26/2016   Procedure: INSERTION PORT-A-CATH;  Surgeon: Autumn Messing III, MD;  Location: [facility];  Service: General;  Laterality: Left;  . TUBAL LIGATION    . WISDOM TOOTH EXTRACTION      There were no vitals filed for this visit.      Subjective Assessment - 10/08/16 1100    Subjective My Rt arm is getting better and better. I'm trying to use it more. I have my second chemo treatment tomorrow. I felt ok after the first one, just a little nauseated.    Pertinent History Patient was diagnosed on 07/23/16 with right grade 3 invasive ductal carcinoma breast cancer.  It is located in the upper outer quadrant and measures 4.7 cm, is ER positive and PR negative, HEr2 negative and has a Ki67 of 90%.  Pt underwent an  right mastectomy on 08/26/2017 they took out 17 lymph nodes with 1 positive. Past pressure includes DM, high blood pressure and high cholesterol  Pt will start chemo on Feb. 23, but hopes to not have radiation.    Patient Stated Goals to be able to move her arm like she used to    Currently in Pain? No/denies            [facility] PT Assessment - 10/08/16 0001      AROM   Right Shoulder Flexion 93 Degrees   Right Shoulder ABduction 92 Degrees                     [facility] Adult PT Treatment/Exercise - 10/08/16 0001      Shoulder Exercises: Supine   Horizontal ABduction Strengthening;Both;5 reps;Theraband   Theraband Level (Shoulder Horizontal ABduction) Level 1 (Yellow)   Horizontal ABduction Weight (lbs) VC to keep elbow extended.    Horizontal ABduction Limitations Challenging for pt to keep her arms in horizontal abduction position.   External Rotation Strengthening;Both;5 reps;Theraband   Theraband Level (Shoulder External Rotation) Level 1 (Yellow)   Flexion Strengthening;Both;5 reps;Theraband  Narrow and Wide Grip 5 times each   Theraband Level (Shoulder Flexion) Level 1 (Yellow)   Flexion Limitations Required mod VC to remind pt to work on full elbow extension  throughout motions for both narrow and wide grip exercises.   Other Supine Exercises Bil D2 with yellow theraband 5 times each returning therapist demonstration, performed with less ROM on Rt side and tactile cuing required for full elbow extnesion which pt had trouble maintaining due to cording.     Shoulder Exercises: Pulleys   Flexion 2 minutes   ABduction 2 minutes   ABduction Limitations Pt did with excellent with technique today/no compensations.     Shoulder Exercises: Therapy Ball   Flexion 10 reps  Withn forward lean into top of stretch   ABduction 10 reps  With Rt UE and side lean into top of stretch     Manual Therapy   Scapular Mobilization --   Manual Lymphatic Drainage (MLD) --   Passive ROM  In Supine to Rt shoulder with HOB slightly elevated into flexion, abduction, D2, and er all to pts tolerance. She needed mod VC to relax throughout and was limited in tolerance to abduction due to pts cording which is palpable at antecubital fossa, into upper arm and at least 3 cords palpable in Rt axilla.                 PT Education - 10/08/16 1126    Education provided Yes   Education Details Supine scapular series with yellow theraband; also instructed pt to cont with her AA/A/ROM stretching throughout day but to really focus on elbow extension with this instructing that the cording is lingering due to her keeping her elbow in a more flexed position.   Person(s) Educated Patient   Methods Explanation;Demonstration;Handout   Comprehension Verbalized understanding;Returned demonstration;Need further instruction           Short Term Clinic Goals - 09/11/16 1150      CC Short Term Goal  #1   Title Patient with verbalize an understanding of lymphedema risk reduction precautions   Time 3   Period Weeks   Status New     CC Short Term Goal  #2   Title Patient will be independent in a basic  home exercise program          Long Term Clinic Goals - 10/08/16 1201      CC Long Term Goal  #1   Title Patient will improve right shoulder flexion range of motion to 150 degrees to perform activities of daily living and household chores with greater ease   Baseline 75 degrees at eval; 93 degrees-10/08/16   Status On-going     CC Long Term Goal  #2   Title Patient will improve right shoulder abduction to 120 degrees so that she can be independent in dressing    Baseline 73 degrees at eval; 92 degrees-10/08/16   Status On-going     CC Long Term Goal  #3   Title Patient will decrease the DASH score to < 38   to demonstrate increased functional use of upper extremity   Status On-going            Plan - 10/08/16 1154    Clinical Impression Statement Pt did well with initial  instruction of supine scapular series except did require mod VC for elbow extension with exercises that required a straight arm. Noticed today her cording is still very prevalent due to her keeping her Rt UE in a slightly flexed position often when she is reaching (noticed this alot in therapy today). She reports noticing she does that and will work to straighten arm out more to  help decrease cording. Her A/ROM continues to improve though marginally so and pt reports she can use her arm now more than she could a few weeks ago.    Rehab Potential Good   Clinical Impairments Affecting Rehab Potential 17 nodes removed.  will have ongoing chemotherapy    PT Frequency 1x / week   PT Duration 6 weeks   PT Treatment/Interventions ADLs/Self Care Home Management;Patient/family education;Orthotic Fit/Training;Taping;Manual techniques;Manual lymph drainage;Compression bandaging;Passive range of motion;Neuromuscular re-education;Therapeutic exercise;Therapeutic activities;Functional mobility training   PT Next Visit Plan Remeasure ROM. Cont AA/A/P/ROM to right shoulder. Review supine scapular series with yellow theraband next visit focusing on achieveing elbow extension; Neuromuscular training.  Make sure pt signs up for ABC class , later Strength ABC program    PT Home Exercise Plan Post op shoulder ROM HEP and supine scapular series with yellow theraband   Consulted and Agree with Plan of Care Patient      Patient will benefit from skilled therapeutic intervention in order to improve the following deficits and impairments:  Postural dysfunction, Decreased knowledge of precautions, Pain, Impaired UE functional use, Decreased range of motion, Decreased skin integrity, Increased edema, Decreased strength, Decreased knowledge of use of DME, Decreased activity tolerance  Visit Diagnosis: Aftercare following surgery for neoplasm  Edema, unspecified type  Acute pain of right shoulder  Stiffness of right  shoulder, not elsewhere classified  Muscle weakness (generalized)  Abnormal posture     Problem List Patient Active Problem List   Diagnosis Date Noted  . Port catheter in place 09/25/2016  . Malignant neoplasm of upper-outer quadrant of right breast in female, estrogen receptor positive ([place]) 08/05/2016  . Breast cancer of upper-outer quadrant of right female breast ([place]) 07/31/2016    Otelia Limes, PTA 10/08/2016, 12:03 PM  [place], [place], [address] Phone: [phone]   Fax:  [phone]  Name: Casey Olson MRN: [id number] Date of Birth: 10/04/1950

## 2016-10-08 NOTE — Patient Instructions (Signed)

## 2016-10-09 ENCOUNTER — Ambulatory Visit: Payer: Medicare Other | Admitting: Hematology and Oncology

## 2016-10-09 ENCOUNTER — Ambulatory Visit (HOSPITAL_BASED_OUTPATIENT_CLINIC_OR_DEPARTMENT_OTHER): Payer: Medicare Other | Admitting: Hematology and Oncology

## 2016-10-09 ENCOUNTER — Ambulatory Visit: Payer: Medicare Other

## 2016-10-09 ENCOUNTER — Ambulatory Visit (HOSPITAL_BASED_OUTPATIENT_CLINIC_OR_DEPARTMENT_OTHER): Payer: Medicare Other

## 2016-10-09 ENCOUNTER — Other Ambulatory Visit (HOSPITAL_BASED_OUTPATIENT_CLINIC_OR_DEPARTMENT_OTHER): Payer: Medicare Other

## 2016-10-09 DIAGNOSIS — C50411 Malignant neoplasm of upper-outer quadrant of right female breast: Secondary | ICD-10-CM | POA: Diagnosis not present

## 2016-10-09 DIAGNOSIS — D649 Anemia, unspecified: Secondary | ICD-10-CM | POA: Diagnosis not present

## 2016-10-09 DIAGNOSIS — Z170 Estrogen receptor positive status [ER+]: Principal | ICD-10-CM

## 2016-10-09 DIAGNOSIS — E119 Type 2 diabetes mellitus without complications: Secondary | ICD-10-CM

## 2016-10-09 DIAGNOSIS — Z5111 Encounter for antineoplastic chemotherapy: Secondary | ICD-10-CM | POA: Diagnosis not present

## 2016-10-09 DIAGNOSIS — Z95828 Presence of other vascular implants and grafts: Secondary | ICD-10-CM

## 2016-10-09 LAB — COMPREHENSIVE METABOLIC PANEL
ALBUMIN: 3.8 g/dL (ref 3.5–5.0)
ALK PHOS: 108 U/L (ref 40–150)
ALT: 12 U/L (ref 0–55)
ANION GAP: 10 meq/L (ref 3–11)
AST: 10 U/L (ref 5–34)
BUN: 10.9 mg/dL (ref 7.0–26.0)
CALCIUM: 9.3 mg/dL (ref 8.4–10.4)
CO2: 23 mEq/L (ref 22–29)
Chloride: 103 mEq/L (ref 98–109)
Creatinine: 0.7 mg/dL (ref 0.6–1.1)
Glucose: 266 mg/dl — ABNORMAL HIGH (ref 70–140)
POTASSIUM: 3.8 meq/L (ref 3.5–5.1)
Sodium: 136 mEq/L (ref 136–145)
Total Bilirubin: 0.23 mg/dL (ref 0.20–1.20)
Total Protein: 7.2 g/dL (ref 6.4–8.3)

## 2016-10-09 LAB — CBC WITH DIFFERENTIAL/PLATELET
BASO%: 0.4 % (ref 0.0–2.0)
BASOS ABS: 0.1 10*3/uL (ref 0.0–0.1)
EOS ABS: 0.2 10*3/uL (ref 0.0–0.5)
EOS%: 1.2 % (ref 0.0–7.0)
HEMATOCRIT: 34.1 % — AB (ref 34.8–46.6)
HGB: 11.1 g/dL — ABNORMAL LOW (ref 11.6–15.9)
LYMPH#: 1.9 10*3/uL (ref 0.9–3.3)
LYMPH%: 11.8 % — ABNORMAL LOW (ref 14.0–49.7)
MCH: 27.5 pg (ref 25.1–34.0)
MCHC: 32.5 g/dL (ref 31.5–36.0)
MCV: 84.5 fL (ref 79.5–101.0)
MONO#: 1.6 10*3/uL — AB (ref 0.1–0.9)
MONO%: 10 % (ref 0.0–14.0)
NEUT#: 12 10*3/uL — ABNORMAL HIGH (ref 1.5–6.5)
NEUT%: 76.6 % (ref 38.4–76.8)
PLATELETS: 192 10*3/uL (ref 145–400)
RBC: 4.03 10*6/uL (ref 3.70–5.45)
RDW: 13.9 % (ref 11.2–14.5)
WBC: 15.7 10*3/uL — ABNORMAL HIGH (ref 3.9–10.3)

## 2016-10-09 MED ORDER — SODIUM CHLORIDE 0.9 % IV SOLN
Freq: Once | INTRAVENOUS | Status: AC
  Administered 2016-10-09: 11:00:00 via INTRAVENOUS

## 2016-10-09 MED ORDER — DOXORUBICIN HCL CHEMO IV INJECTION 2 MG/ML
60.0000 mg/m2 | Freq: Once | INTRAVENOUS | Status: AC
  Administered 2016-10-09: 112 mg via INTRAVENOUS
  Filled 2016-10-09: qty 56

## 2016-10-09 MED ORDER — PALONOSETRON HCL INJECTION 0.25 MG/5ML
INTRAVENOUS | Status: AC
  Filled 2016-10-09: qty 5

## 2016-10-09 MED ORDER — PEGFILGRASTIM 6 MG/0.6ML ~~LOC~~ PSKT
6.0000 mg | PREFILLED_SYRINGE | Freq: Once | SUBCUTANEOUS | Status: AC
  Administered 2016-10-09: 6 mg via SUBCUTANEOUS
  Filled 2016-10-09: qty 0.6

## 2016-10-09 MED ORDER — SODIUM CHLORIDE 0.9% FLUSH
10.0000 mL | INTRAVENOUS | Status: DC | PRN
  Administered 2016-10-09: 10 mL via INTRAVENOUS
  Filled 2016-10-09: qty 10

## 2016-10-09 MED ORDER — HEPARIN SOD (PORK) LOCK FLUSH 100 UNIT/ML IV SOLN
500.0000 [IU] | Freq: Once | INTRAVENOUS | Status: AC | PRN
  Administered 2016-10-09: 500 [IU]
  Filled 2016-10-09: qty 5

## 2016-10-09 MED ORDER — SODIUM CHLORIDE 0.9 % IV SOLN
600.0000 mg/m2 | Freq: Once | INTRAVENOUS | Status: AC
  Administered 2016-10-09: 1120 mg via INTRAVENOUS
  Filled 2016-10-09: qty 56

## 2016-10-09 MED ORDER — SODIUM CHLORIDE 0.9% FLUSH
10.0000 mL | INTRAVENOUS | Status: DC | PRN
Start: 2016-10-09 — End: 2016-10-09
  Administered 2016-10-09: 10 mL
  Filled 2016-10-09: qty 10

## 2016-10-09 MED ORDER — PALONOSETRON HCL INJECTION 0.25 MG/5ML
0.2500 mg | Freq: Once | INTRAVENOUS | Status: AC
  Administered 2016-10-09: 0.25 mg via INTRAVENOUS

## 2016-10-09 MED ORDER — SODIUM CHLORIDE 0.9 % IV SOLN
Freq: Once | INTRAVENOUS | Status: AC
  Administered 2016-10-09: 11:00:00 via INTRAVENOUS
  Filled 2016-10-09: qty 5

## 2016-10-09 NOTE — Patient Instructions (Signed)
[facility] Discharge Instructions for Patients Receiving Chemotherapy  Today you received the following chemotherapy agents:  Cytoxan (cyclophosphamide), Adriamycin (doxorubicin)  To help prevent nausea and vomiting after your treatment, we encourage you to take your nausea medication as prescribed.   If you develop nausea and vomiting that is not controlled by your nausea medication, call the clinic.   BELOW ARE SYMPTOMS THAT SHOULD BE REPORTED IMMEDIATELY:  *FEVER GREATER THAN 100.5 F  *CHILLS WITH OR WITHOUT FEVER  NAUSEA AND VOMITING THAT IS NOT CONTROLLED WITH YOUR NAUSEA MEDICATION  *UNUSUAL SHORTNESS OF BREATH  *UNUSUAL BRUISING OR BLEEDING  TENDERNESS IN MOUTH AND THROAT WITH OR WITHOUT PRESENCE OF ULCERS  *URINARY PROBLEMS  *BOWEL PROBLEMS  UNUSUAL RASH Items with * indicate a potential emergency and should be followed up as soon as possible.  Feel free to call the clinic you have any questions or concerns. The clinic phone number is [phone].  Please show the [place] at check-in to the Emergency Department and triage nurse.

## 2016-10-09 NOTE — Progress Notes (Signed)
Patient Care Team: Nolene Ebbs, MD as PCP - General (Internal Medicine) Autumn Messing III, MD as Consulting Physician (General Surgery) Nicholas Lose, MD as Consulting Physician (Hematology and Oncology) Kyung Rudd, MD as Consulting Physician (Radiation Oncology) Gardenia Phlegm, NP as Nurse Practitioner (Hematology and Oncology)  DIAGNOSIS:  Encounter Diagnosis  Name Primary?  . Malignant neoplasm of upper-outer quadrant of right breast in female, estrogen receptor positive ([place])     SUMMARY OF ONCOLOGIC HISTORY:   Breast cancer of upper-outer quadrant of right female breast ([place])   07/24/2016 Initial Diagnosis    Palpable right breast mass for 2 months UOQ at 10:00: 4.7 x 4.1 x 3.7 cm with enlarged axillary lymph node biopsy benign, breast mass biopsy grade 3 IDC ER 30% week PR negative HER-2 negative Ki-67 90%       Chemotherapy    Dose dense Adriamycin and Cytoxan 4 followed by weekly Abraxane 12      08/26/2016 Surgery    Right mastectomy: IDC grade 3, 7 cm, margins negative, 1/17 nodes positive, ER weakly +30%, PR negative, HER-2 negative, Ki-67 90%, T3 N1 stage IIIa      09/25/2016 -  Chemotherapy     Dose dense Adriamycin Cytoxan 4 followed y weelyAbraxane 12        CHIEF COMPLIANT: Cycle 2 dose dense Adriamycin and Cytoxan  INTERVAL HISTORY: Ghina Bittinger is a 28 year with above-mentioned history of right breast cancer currently on adjuvant chemotherapy and today is cycle 2 of dose dense Adriamycin and Cytoxan. She had tolerated cycle 1 extremely well. She did not have much nausea or vomiting. She had maintained her appetite and weight. This morning her blood sugars are elevated.  REVIEW OF SYSTEMS:   Constitutional: Denies fevers, chills or abnormal weight loss Eyes: Denies blurriness of vision Ears, nose, mouth, throat, and face: Denies mucositis or sore throat Respiratory: Denies cough, dyspnea or wheezes Cardiovascular: Denies palpitation, chest  discomfort Gastrointestinal:  Denies nausea, heartburn or change in bowel habits Skin: Denies abnormal skin rashes Lymphatics: Denies new lymphadenopathy or easy bruising Neurological:Denies numbness, tingling or new weaknesses Behavioral/Psych: Mood is stable, no new changes  Extremities: No lower extremity edema All other systems were reviewed with the patient and are negative.  I have reviewed the past medical history, past surgical history, social history and family history with the patient and they are unchanged from previous note.  ALLERGIES:  is allergic to percocet [oxycodone-acetaminophen].  MEDICATIONS:  Current Outpatient Prescriptions  Medication Sig Dispense Refill  . acetaminophen (TYLENOL) 325 MG tablet Take 650 mg by mouth every 6 (six) hours as needed for mild pain.    Marland Kitchen aspirin EC 81 MG tablet Take 81 mg by mouth daily.    . Blood Glucose Monitoring Suppl (ONETOUCH VERIO FLEX SYSTEM) w/Device KIT USE TO TEST 3 TIMES DAILY DX E11.65  11  . Calcium Carbonate-Vitamin D (CALTRATE 600+D PO) Take 1 tablet by mouth 2 (two) times daily.    Marland Kitchen gabapentin (NEURONTIN) 100 MG capsule Take 1 capsule (100 mg total) by mouth 3 (three) times daily. 60 capsule 2  . Hyprom-Naphaz-Polysorb-Zn Sulf (CLEAR EYES COMPLETE OP) Apply 2 drops to eye 2 (two) times daily as needed (irritation).    Marland Kitchen LANTUS SOLOSTAR 100 UNIT/ML Solostar Pen INJECT 20 UNITS IN THE MORNING  5  . lidocaine-prilocaine (EMLA) cream Apply to affected area once (Patient not taking: Reported on 08/18/2016) 30 g 3  . LORazepam (ATIVAN) 0.5 MG tablet Take 1 tablet (  0.5 mg total) by mouth every 6 (six) hours as needed (Nausea or vomiting). 30 tablet 0  . losartan (COZAAR) 50 MG tablet Take 50 mg by mouth daily.  5  . metFORMIN (GLUCOPHAGE) 1000 MG tablet Take 1,000 mg by mouth 2 (two) times daily.  5  . Multiple Vitamin (MULTIVITAMIN WITH MINERALS) TABS tablet Take 1 tablet by mouth daily.    . ondansetron (ZOFRAN) 8 MG tablet  Take 1 tablet (8 mg total) by mouth 2 (two) times daily as needed. Start on the third day after chemotherapy. 30 tablet 1  . ONETOUCH DELICA LANCETS FINE MISC USE TO TEST 3 TIMES DAILY DX E11.65  11  . ONETOUCH VERIO test strip USE TO TEST 3 TIMES DAILY DX E11.65  11  . prochlorperazine (COMPAZINE) 10 MG tablet Take 1 tablet (10 mg total) by mouth every 6 (six) hours as needed (Nausea or vomiting). (Patient not taking: Reported on 08/18/2016) 30 tablet 1  . simvastatin (ZOCOR) 10 MG tablet Take 10 mg by mouth daily at 6 PM.   2   No current facility-administered medications for this visit.    Facility-Administered Medications Ordered in Other Visits  Medication Dose Route Frequency Provider Last Rate Last Dose  . sodium chloride flush (NS) 0.9 % injection 10 mL  10 mL Intravenous PRN Nicholas Lose, MD   10 mL at 09/25/16 1112    PHYSICAL EXAMINATION: ECOG PERFORMANCE STATUS: 1 - Symptomatic but completely ambulatory  Vitals:   10/09/16 0946  BP: (!) 147/62  Pulse: 79  Resp: 20  Temp: 98 F (36.7 C)   Filed Weights   10/09/16 0946  Weight: 161 lb 8 oz (73.3 kg)    GENERAL:alert, no distress and comfortable SKIN: skin color, texture, turgor are normal, no rashes or significant lesions EYES: normal, Conjunctiva are pink and non-injected, sclera clear OROPHARYNX:no exudate, no erythema and lips, buccal mucosa, and tongue normal  NECK: supple, thyroid normal size, non-tender, without nodularity LYMPH:  no palpable lymphadenopathy in the cervical, axillary or inguinal LUNGS: clear to auscultation and percussion with normal breathing effort HEART: regular rate & rhythm and no murmurs and no lower extremity edema ABDOMEN:abdomen soft, non-tender and normal bowel sounds MUSCULOSKELETAL:no cyanosis of digits and no clubbing  NEURO: alert & oriented x 3 with fluent speech, no focal motor/sensory deficits EXTREMITIES: No lower extremity edema  LABORATORY DATA:  I have reviewed the data  as listed   Chemistry      Component Value Date/Time   NA 136 10/09/2016 0907   K 3.8 10/09/2016 0907   CL 101 08/21/2016 1333   CO2 23 10/09/2016 0907   BUN 10.9 10/09/2016 0907   CREATININE 0.7 10/09/2016 0907      Component Value Date/Time   CALCIUM 9.3 10/09/2016 0907   ALKPHOS 108 10/09/2016 0907   AST 10 10/09/2016 0907   ALT 12 10/09/2016 0907   BILITOT 0.23 10/09/2016 0907       Lab Results  Component Value Date   WBC 15.7 (H) 10/09/2016   HGB 11.1 (L) 10/09/2016   HCT 34.1 (L) 10/09/2016   MCV 84.5 10/09/2016   PLT 192 10/09/2016   NEUTROABS 12.0 (H) 10/09/2016    ASSESSMENT & PLAN:  Breast cancer of upper-outer quadrant of right female breast ([place]) 07/24/2016: Palpable right breast mass for 2 months UOQ at 10:00: 4.7 x 4.1 x 3.7 cm with enlarged axillary lymph node biopsy benign, breast mass biopsy grade 3 IDC ER 30% week PR  negative HER-2 negative Ki-67 90% CT CAP and Bone scan: No mets  Treatment plan based on multidisciplinary tumor board: 1. Right mastectomy01/24/2018: IDC grade 3, 7 cm, margins negative, 1/17 nodes positive, ER weakly +30%, PR negative, HER-2 negative, Ki-67 90%, T3 N1 stage IIIa 2. Adjuvant chemotherapy with Adriamycin and Cytoxan dose dense 4 followed by Abraxane weekly 12 (Patient cannot state steroids due to diabetes and so we are not using Taxol) 3. Followed by adjuvant radiation therapy 4. Followed by adjuvant antiestrogen therapy -------------------------------------------------------------------------------------------------------------------------- Current treatment: Cycle 2 day 1 dose dense Adriamycin Cytoxan Echocardiogram 09/16/2016: EF 60-65 %  Chemotherapy toxicities: 1. Fatigue due to chemotherapy 2. nausea grade 1 3. Anemia: Being monitored Return to clinic in 2 weeks for cycle 3   I spent 25 minutes talking to the patient of which more than half was spent in counseling and coordination of care.  No orders of  the defined types were placed in this encounter.  The patient has a good understanding of the overall plan. she agrees with it. she will call with any problems that may develop before the next visit here.   Rulon Eisenmenger, MD 10/09/16

## 2016-10-09 NOTE — Assessment & Plan Note (Signed)
07/24/2016: Palpable right breast mass for 2 months UOQ at 10:00: 4.7 x 4.1 x 3.7 cm with enlarged axillary lymph node biopsy benign, breast mass biopsy grade 3 IDC ER 30% week PR negative HER-2 negative Ki-67 90% CT CAP and Bone scan: No mets  Treatment plan based on multidisciplinary tumor board: 1. Right mastectomy01/24/2018: IDC grade 3, 7 cm, margins negative, 1/17 nodes positive, ER weakly +30%, PR negative, HER-2 negative, Ki-67 90%, T3 N1 stage IIIa 2. Adjuvant chemotherapy with Adriamycin and Cytoxan dose dense 4 followed by Abraxane weekly 12 (Patient cannot state steroids due to diabetes and so we are not using Taxol) 3. Followed by adjuvant radiation therapy 4. Followed by adjuvant antiestrogen therapy -------------------------------------------------------------------------------------------------------------------------- Current treatment: Cycle 2 day 1 dose dense Adriamycin Cytoxan Echocardiogram 09/16/2016: EF 60-65 %  Chemotherapy toxicities: 1. Fatigue due to chemotherapy 2. nausea grade 1  Return to clinic in 2 weeks for cycle 3

## 2016-10-12 ENCOUNTER — Telehealth: Payer: Self-pay | Admitting: Genetic Counselor

## 2016-10-12 ENCOUNTER — Other Ambulatory Visit: Payer: Medicare Other

## 2016-10-12 NOTE — Telephone Encounter (Signed)
Appt r/s per patient due to inclement weather. Patient is aware of new date and time.

## 2016-10-15 ENCOUNTER — Ambulatory Visit: Payer: Medicare Other

## 2016-10-15 DIAGNOSIS — R293 Abnormal posture: Secondary | ICD-10-CM

## 2016-10-15 DIAGNOSIS — Z483 Aftercare following surgery for neoplasm: Secondary | ICD-10-CM

## 2016-10-15 DIAGNOSIS — R609 Edema, unspecified: Secondary | ICD-10-CM

## 2016-10-15 DIAGNOSIS — M6281 Muscle weakness (generalized): Secondary | ICD-10-CM

## 2016-10-15 DIAGNOSIS — M25511 Pain in right shoulder: Secondary | ICD-10-CM

## 2016-10-15 DIAGNOSIS — M25611 Stiffness of right shoulder, not elsewhere classified: Secondary | ICD-10-CM

## 2016-10-15 NOTE — Therapy (Signed)
[place], [place], [address] Phone: [phone]   Fax:  [phone]  Physical Therapy Treatment  Patient Details  Name: Casey Olson MRN: [id number] Date of Birth: 08/22/1950 Referring Provider: Dr. Marlou Starks  Encounter Date: 10/15/2016      PT End of Session - 10/15/16 1106    Visit Number 6   Number of Visits 7   Date for PT Re-Evaluation 10/30/16   PT Start Time 1024   PT Stop Time 1107   PT Time Calculation (min) 43 min   Activity Tolerance Patient tolerated treatment well   Behavior During Therapy [facility] for tasks assessed/performed      Past Medical History:  Diagnosis Date  . Bronchitis   . Cancer [facility])    right breast  . Diabetes mellitus without complication ([place])    type 2  . GERD (gastroesophageal reflux disease)   . High cholesterol   . Hypertension     Past Surgical History:  Procedure Laterality Date  . ABDOMINAL HYSTERECTOMY    . COLONOSCOPY    . MASTECTOMY W/ SENTINEL NODE BIOPSY Right 08/26/2016   Procedure: RIGHT MASTECTOMY WITH SENTINEL LYMPH NODE BIOPSY;  Surgeon: Autumn Messing III, MD;  Location: [place];  Service: General;  Laterality: Right;  . PORTACATH PLACEMENT Left 08/26/2016   Procedure: INSERTION PORT-A-CATH;  Surgeon: Autumn Messing III, MD;  Location: [place];  Service: General;  Laterality: Left;  . TUBAL LIGATION    . WISDOM TOOTH EXTRACTION      There were no vitals filed for this visit.      Subjective Assessment - 10/15/16 1033    Subjective I've been doing a little bit aroundthe house (dishes, vaccuuming) and that's going okay. Just making sure to pace myself and not work too long.   Pertinent History Patient was diagnosed on 07/23/16 with right grade 3 invasive ductal carcinoma breast cancer.  It is located in the upper outer quadrant and measures 4.7 cm, is ER positive and PR negative, HEr2 negative and has a Ki67 of 90%.  Pt underwent an right mastectomy on  08/26/2017 they took out 17 lymph nodes with 1 positive. Past pressure includes DM, high blood pressure and high cholesterol  Pt will start chemo on Feb. 23, but hopes to not have radiation.    Patient Stated Goals to be able to move her arm like she used to    Currently in Pain? No/denies            [facility] PT Assessment - 10/15/16 0001      AROM   Right Shoulder Flexion 99 Degrees  118 degrees with UE ranger; 104 degrees after stretching   Right Shoulder ABduction 94 Degrees  104 degrees with UE ranger                     [facility] Adult PT Treatment/Exercise - 10/15/16 0001      Shoulder Exercises: Standing   Flexion AAROM;Right;10 reps  UE ranger   ABduction AAROM;Right;5 reps  UE ranger     Shoulder Exercises: Pulleys   Flexion 2 minutes   ABduction 2 minutes     Shoulder Exercises: Therapy Ball   Flexion 10 reps  With forward lean into top of stretch   ABduction 10 reps  Rt UE with side lean into top of stretch     Shoulder Exercises: ROM/Strengthening   Other ROM/Strengthening Exercises Corner Stretch 5 times, 10 second holds, very challenging  for pt     Manual Therapy   Scapular Mobilization In Lt S/L for protraction with arm at side and behind back   Passive ROM In Supine to Rt shoulder with HOB slightly elevated into flexion, abduction, and er all to pts tolerance. She required less VC today to relax throughout and was slightly less limited in tolerance to abduction due to pts cording which is palpable at antecubital fossa, into upper arm and at least 3 cords palpable in Rt axilla.                    Short Term Clinic Goals - 09/11/16 1150      CC Short Term Goal  #1   Title Patient with verbalize an understanding of lymphedema risk reduction precautions   Time 3   Period Weeks   Status New     CC Short Term Goal  #2   Title Patient will be independent in a basic  home exercise program           Breast Clinic Goals - 08/05/16  1135      Patient will be able to verbalize understanding of pertinent lymphedema risk reduction practices relevant to her diagnosis specifically related to skin care.   Time 1   Period Days   Status Achieved     Patient will be able to return demonstrate and/or verbalize understanding of the post-op home exercise program related to regaining shoulder range of motion.   Time 1   Period Days   Status Achieved     Patient will be able to verbalize understanding of the importance of attending the postoperative After Breast Cancer Class for further lymphedema risk reduction education and therapeutic exercise.   Time 1   Period Days   Status Achieved          Long Term Clinic Goals - 10/08/16 1201      CC Long Term Goal  #1   Title Patient will improve right shoulder flexion range of motion to 150 degrees to perform activities of daily living and household chores with greater ease   Baseline 75 degrees at eval; 93 degrees-10/08/16   Status On-going     CC Long Term Goal  #2   Title Patient will improve right shoulder abduction to 120 degrees so that she can be independent in dressing    Baseline 73 degrees at eval; 92 degrees-10/08/16   Status On-going     CC Long Term Goal  #3   Title Patient will decrease the DASH score to < 38   to demonstrate increased functional use of upper extremity   Status On-going            Plan - 10/15/16 1110    Clinical Impression Statement PT continues to make slow but steady gains with her A/ROM. She is pleased with her progress thus far and plans to continue when renewal needed in next 1-2 visits. Small pop felt with stetching today and pt had slightly increased abduction with some less discomfort after.    Rehab Potential Good   Clinical Impairments Affecting Rehab Potential 17 nodes removed.  will have ongoing chemotherapy    PT Frequency 1x / week   PT Duration 6 weeks   PT Treatment/Interventions ADLs/Self Care Home  Management;Patient/family education;Orthotic Fit/Training;Taping;Manual techniques;Manual lymph drainage;Compression bandaging;Passive range of motion;Neuromuscular re-education;Therapeutic exercise;Therapeutic activities;Functional mobility training   PT Next Visit Plan Remeasure ROM/possible renewal next. Begin Strength ABC program. Cont AA/A/P/ROM to right  shoulder. Review supine scapular series with yellow theraband next visit focusing on achieving elbow extension; Neuromuscular training.  Make sure pt signs up for ABC class.   Consulted and Agree with Plan of Care Patient      Patient will benefit from skilled therapeutic intervention in order to improve the following deficits and impairments:  Postural dysfunction, Decreased knowledge of precautions, Pain, Impaired UE functional use, Decreased range of motion, Decreased skin integrity, Increased edema, Decreased strength, Decreased knowledge of use of DME, Decreased activity tolerance  Visit Diagnosis: Aftercare following surgery for neoplasm  Edema, unspecified type  Acute pain of right shoulder  Stiffness of right shoulder, not elsewhere classified  Muscle weakness (generalized)  Abnormal posture     Problem List Patient Active Problem List   Diagnosis Date Noted  . Port catheter in place 09/25/2016  . Malignant neoplasm of upper-outer quadrant of right breast in female, estrogen receptor positive ([place]) 08/05/2016  . Breast cancer of upper-outer quadrant of right female breast ([place]) 07/31/2016    Otelia Limes, PTA 10/15/2016, 11:12 AM  [place], [place], [address] Phone: [phone]   Fax:  [phone]  Name: Casey Olson MRN: [id number] Date of Birth: Dec 14, 1950

## 2016-10-22 ENCOUNTER — Ambulatory Visit: Payer: Medicare Other | Admitting: Physical Therapy

## 2016-10-22 DIAGNOSIS — R609 Edema, unspecified: Secondary | ICD-10-CM

## 2016-10-22 DIAGNOSIS — Z483 Aftercare following surgery for neoplasm: Secondary | ICD-10-CM | POA: Diagnosis not present

## 2016-10-22 DIAGNOSIS — M25611 Stiffness of right shoulder, not elsewhere classified: Secondary | ICD-10-CM

## 2016-10-22 DIAGNOSIS — M6281 Muscle weakness (generalized): Secondary | ICD-10-CM

## 2016-10-22 DIAGNOSIS — M25511 Pain in right shoulder: Secondary | ICD-10-CM

## 2016-10-22 NOTE — Therapy (Signed)
[place], [place], [address] Phone: [phone]   Fax:  [phone]  Physical Therapy Treatment  Patient Details  Name: Casey Olson MRN: [id number] Date of Birth: [birth year] Referring Provider: Dr. Marlou Starks  Encounter Date: 10/22/2016      PT End of Session - 10/22/16 1236    Visit Number 7   Number of Visits 7   Date for PT Re-Evaluation 10/30/16   PT Start Time 1030   PT Stop Time 1115   PT Time Calculation (min) 45 min   Activity Tolerance Patient tolerated treatment well   Behavior During Therapy [facility] for tasks assessed/performed      Past Medical History:  Diagnosis Date  . Bronchitis   . Cancer [facility])    right breast  . Diabetes mellitus without complication ([place])    type 2  . GERD (gastroesophageal reflux disease)   . High cholesterol   . Hypertension     Past Surgical History:  Procedure Laterality Date  . ABDOMINAL HYSTERECTOMY    . COLONOSCOPY    . MASTECTOMY W/ SENTINEL NODE BIOPSY Right 08/26/2016   Procedure: RIGHT MASTECTOMY WITH SENTINEL LYMPH NODE BIOPSY;  Surgeon: Autumn Messing III, MD;  Location: [facility];  Service: General;  Laterality: Right;  . PORTACATH PLACEMENT Left 08/26/2016   Procedure: INSERTION PORT-A-CATH;  Surgeon: Autumn Messing III, MD;  Location: [facility];  Service: General;  Laterality: Left;  . TUBAL LIGATION    . WISDOM TOOTH EXTRACTION      There were no vitals filed for this visit.      Subjective Assessment - 10/22/16 1027    Subjective (P)  pt is doing OK.  Started chemo and will have anothera treatment tomorrow. She goes every other week.    Patient is accompained by: (P)  Family member   Pertinent History (P)  Patient was diagnosed on 07/23/16 with right grade 3 invasive ductal carcinoma breast cancer.  It is located in the upper outer quadrant and measures 4.7 cm, is ER positive and PR negative, HEr2 negative and has a Ki67 of 90%.  Pt underwent an right  mastectomy on 08/26/2017 they took out 17 lymph nodes with 1 positive. Past pressure includes DM, high blood pressure and high cholesterol  Pt will start chemo on Feb. 23, but hopes to not have radiation.    Patient Stated Goals (P)  to be able to move her arm like she used to                          [facility] Adult PT Treatment/Exercise - 10/22/16 0001      Self-Care   Self-Care Other Self-Care Comments   Other Self-Care Comments  used a mirror for visual feedback wihile istructing patient in self manual lymph drainage to fullness in  anterior axillary region.  provided small pieced of 1/2 foam for axillary fullness and small tg soft to support right UE lymphatics to reduce cording      Lumbar Exercises: Supine   Other Supine Lumbar Exercises lower turnk rotation with arms in goal post position for stretch to trunk and anterior chrest      Shoulder Exercises: Supine   Protraction AROM;Right;10 reps   Other Supine Exercises small circles with control with hand pointed to ceiling.    Other Supine Exercises elbow flexion and extension      Shoulder Exercises: Pulleys   Flexion 2 minutes  ABduction 2 minutes     Manual Therapy   Manual Therapy Manual Lymphatic Drainage (MLD);Passive ROM   Manual therapy comments visible and palpable cording in Right axilla, upper arm,  antecubital fossa and forearm    Manual Lymphatic Drainage (MLD) In supine, right short neck ( left upper quadrant avoided due to port placement) right inguinal nodes, right interaxiallry anastamosis, right shoulder, upper arm, lower arm with myofascial stretch at areas of cording    Passive ROM In Supine to Rt shoulder with HOB slightly elevated into flexion, abduction, and er all to pts tolerance. Passive prolonged stretching to areas of cording down into arm                 PT Education - 10/22/16 1234    Education provided Yes   Education Details wear tg soft this afternoon and check to see effect  on cording at elbow when it is removed.  Do no allow tg soft to roll up; keep if folded down. Remove tg soft at any sign of discomfort.    Person(s) Educated Patient   Methods Explanation   Comprehension Verbalized understanding           Short Term Clinic Goals - 09/11/16 1150      CC Short Term Goal  #1   Title Patient with verbalize an understanding of lymphedema risk reduction precautions   Time 3   Period Weeks   Status New     CC Short Term Goal  #2   Title Patient will be independent in a basic  home exercise program           Breast Clinic Goals - 08/05/16 1135      Patient will be able to verbalize understanding of pertinent lymphedema risk reduction practices relevant to her diagnosis specifically related to skin care.   Time 1   Period Days   Status Achieved     Patient will be able to return demonstrate and/or verbalize understanding of the post-op home exercise program related to regaining shoulder range of motion.   Time 1   Period Days   Status Achieved     Patient will be able to verbalize understanding of the importance of attending the postoperative After Breast Cancer Class for further lymphedema risk reduction education and therapeutic exercise.   Time 1   Period Days   Status Achieved          Long Term Clinic Goals - 10/08/16 1201      CC Long Term Goal  #1   Title Patient will improve right shoulder flexion range of motion to 150 degrees to perform activities of daily living and household chores with greater ease   Baseline 75 degrees at eval; 93 degrees-10/08/16   Status On-going     CC Long Term Goal  #2   Title Patient will improve right shoulder abduction to 120 degrees so that she can be independent in dressing    Baseline 73 degrees at eval; 92 degrees-10/08/16   Status On-going     CC Long Term Goal  #3   Title Patient will decrease the DASH score to < 38   to demonstrate increased functional use of upper extremity   Status  On-going            Plan - 10/22/16 1238    Clinical Impression Statement Pt continues to have cording in right upper extremity with decreased shoulder range of motion. She has some fatigue with exercise  today especially with scapular protraction.  She is only coming to PT once per week and is having some fatigue from chemotherapy, Anticipate she will need renewal as she continues to have symptoms and is at risk for developing lymphedema.    Rehab Potential Good   Clinical Impairments Affecting Rehab Potential 17 nodes removed.  will have ongoing chemotherapy    PT Frequency 1x / week   PT Duration 6 weeks   PT Treatment/Interventions ADLs/Self Care Home Management;Patient/family education;Orthotic Fit/Training;Taping;Manual techniques;Manual lymph drainage;Compression bandaging;Passive range of motion;Neuromuscular re-education;Therapeutic exercise;Therapeutic activities;Functional mobility training   PT Next Visit Plan Remeasure ROM/possible renewal next. Cont AA/A/P/ROM to right shoulder with MLD for cording?  assess effect of tg soft to help with cording. Review supine scapular series with yellow theraband focusing on achieving elbow extension Continue protraciton strengthening.  Make sure pt signs up for ABC class and discuss prophylacic compression sleeve.    Consulted and Agree with Plan of Care Patient      Patient will benefit from skilled therapeutic intervention in order to improve the following deficits and impairments:  Postural dysfunction, Decreased knowledge of precautions, Pain, Impaired UE functional use, Decreased range of motion, Decreased skin integrity, Increased edema, Decreased strength, Decreased knowledge of use of DME, Decreased activity tolerance  Visit Diagnosis: Aftercare following surgery for neoplasm  Edema, unspecified type  Acute pain of right shoulder  Stiffness of right shoulder, not elsewhere classified  Muscle weakness  (generalized)     Problem List Patient Active Problem List   Diagnosis Date Noted  . Port catheter in place 09/25/2016  . Malignant neoplasm of upper-outer quadrant of right breast in female, estrogen receptor positive ([place]) 08/05/2016  . Breast cancer of upper-outer quadrant of right female breast ([place]) 07/31/2016   Donato Heinz. Owens Shark PT  Norwood Levo 10/22/2016, 12:47 PM  [place], [place], [address] Phone: [phone]   Fax:  [phone]  Name: Jemia Fata MRN: [id number] Date of Birth: 12-13-1950

## 2016-10-23 ENCOUNTER — Ambulatory Visit (HOSPITAL_BASED_OUTPATIENT_CLINIC_OR_DEPARTMENT_OTHER): Payer: Medicare Other | Admitting: Adult Health

## 2016-10-23 ENCOUNTER — Ambulatory Visit: Payer: Medicare Other

## 2016-10-23 ENCOUNTER — Other Ambulatory Visit (HOSPITAL_BASED_OUTPATIENT_CLINIC_OR_DEPARTMENT_OTHER): Payer: Medicare Other

## 2016-10-23 ENCOUNTER — Encounter: Payer: Self-pay | Admitting: Adult Health

## 2016-10-23 ENCOUNTER — Ambulatory Visit (HOSPITAL_BASED_OUTPATIENT_CLINIC_OR_DEPARTMENT_OTHER): Payer: Medicare Other

## 2016-10-23 VITALS — BP 127/56 | HR 89 | Temp 98.1°F | Resp 18 | Ht 63.0 in | Wt 157.2 lb

## 2016-10-23 DIAGNOSIS — Z5111 Encounter for antineoplastic chemotherapy: Secondary | ICD-10-CM | POA: Diagnosis not present

## 2016-10-23 DIAGNOSIS — C50411 Malignant neoplasm of upper-outer quadrant of right female breast: Secondary | ICD-10-CM

## 2016-10-23 DIAGNOSIS — Z170 Estrogen receptor positive status [ER+]: Secondary | ICD-10-CM

## 2016-10-23 DIAGNOSIS — R1012 Left upper quadrant pain: Secondary | ICD-10-CM | POA: Diagnosis not present

## 2016-10-23 DIAGNOSIS — Z95828 Presence of other vascular implants and grafts: Secondary | ICD-10-CM

## 2016-10-23 LAB — CBC WITH DIFFERENTIAL/PLATELET
BASO%: 0.5 % (ref 0.0–2.0)
Basophils Absolute: 0.1 10*3/uL (ref 0.0–0.1)
EOS%: 2.5 % (ref 0.0–7.0)
Eosinophils Absolute: 0.4 10*3/uL (ref 0.0–0.5)
HCT: 31.9 % — ABNORMAL LOW (ref 34.8–46.6)
HEMOGLOBIN: 10.4 g/dL — AB (ref 11.6–15.9)
LYMPH#: 1.1 10*3/uL (ref 0.9–3.3)
LYMPH%: 6.4 % — ABNORMAL LOW (ref 14.0–49.7)
MCH: 27.6 pg (ref 25.1–34.0)
MCHC: 32.6 g/dL (ref 31.5–36.0)
MCV: 84.5 fL (ref 79.5–101.0)
MONO#: 2 10*3/uL — ABNORMAL HIGH (ref 0.1–0.9)
MONO%: 11.5 % (ref 0.0–14.0)
NEUT%: 79.1 % — ABNORMAL HIGH (ref 38.4–76.8)
NEUTROS ABS: 13.7 10*3/uL — AB (ref 1.5–6.5)
Platelets: 203 10*3/uL (ref 145–400)
RBC: 3.77 10*6/uL (ref 3.70–5.45)
RDW: 15.4 % — ABNORMAL HIGH (ref 11.2–14.5)
WBC: 17.4 10*3/uL — AB (ref 3.9–10.3)

## 2016-10-23 LAB — COMPREHENSIVE METABOLIC PANEL
ALBUMIN: 4 g/dL (ref 3.5–5.0)
ALK PHOS: 113 U/L (ref 40–150)
ALT: 13 U/L (ref 0–55)
AST: 11 U/L (ref 5–34)
Anion Gap: 10 mEq/L (ref 3–11)
BUN: 11.4 mg/dL (ref 7.0–26.0)
CO2: 24 mEq/L (ref 22–29)
CREATININE: 0.7 mg/dL (ref 0.6–1.1)
Calcium: 9.6 mg/dL (ref 8.4–10.4)
Chloride: 106 mEq/L (ref 98–109)
EGFR: >90 mL/min/{1.73_m2} (ref >90)
GLUCOSE: 150 mg/dL — AB (ref 70–140)
Potassium: 3.7 mEq/L (ref 3.5–5.1)
SODIUM: 140 meq/L (ref 136–145)
TOTAL PROTEIN: 7.3 g/dL (ref 6.4–8.3)
Total Bilirubin: 0.29 mg/dL (ref 0.20–1.20)

## 2016-10-23 MED ORDER — SODIUM CHLORIDE 0.9 % IV SOLN
Freq: Once | INTRAVENOUS | Status: AC
  Administered 2016-10-23: 11:00:00 via INTRAVENOUS

## 2016-10-23 MED ORDER — SODIUM CHLORIDE 0.9% FLUSH
10.0000 mL | INTRAVENOUS | Status: DC | PRN
  Administered 2016-10-23: 10 mL
  Filled 2016-10-23: qty 10

## 2016-10-23 MED ORDER — SODIUM CHLORIDE 0.9 % IV SOLN
600.0000 mg/m2 | Freq: Once | INTRAVENOUS | Status: AC
  Administered 2016-10-23: 1120 mg via INTRAVENOUS
  Filled 2016-10-23: qty 56

## 2016-10-23 MED ORDER — SODIUM CHLORIDE 0.9 % IV SOLN
Freq: Once | INTRAVENOUS | Status: AC
  Administered 2016-10-23: 11:00:00 via INTRAVENOUS
  Filled 2016-10-23: qty 5

## 2016-10-23 MED ORDER — SODIUM CHLORIDE 0.9% FLUSH
10.0000 mL | INTRAVENOUS | Status: DC | PRN
  Administered 2016-10-23: 10 mL via INTRAVENOUS
  Filled 2016-10-23: qty 10

## 2016-10-23 MED ORDER — PALONOSETRON HCL INJECTION 0.25 MG/5ML
0.2500 mg | Freq: Once | INTRAVENOUS | Status: AC
  Administered 2016-10-23: 0.25 mg via INTRAVENOUS

## 2016-10-23 MED ORDER — HEPARIN SOD (PORK) LOCK FLUSH 100 UNIT/ML IV SOLN
500.0000 [IU] | Freq: Once | INTRAVENOUS | Status: AC | PRN
  Administered 2016-10-23: 500 [IU]
  Filled 2016-10-23: qty 5

## 2016-10-23 MED ORDER — DOXORUBICIN HCL CHEMO IV INJECTION 2 MG/ML
60.0000 mg/m2 | Freq: Once | INTRAVENOUS | Status: AC
  Administered 2016-10-23: 112 mg via INTRAVENOUS
  Filled 2016-10-23: qty 56

## 2016-10-23 MED ORDER — PEGFILGRASTIM 6 MG/0.6ML ~~LOC~~ PSKT
6.0000 mg | PREFILLED_SYRINGE | Freq: Once | SUBCUTANEOUS | Status: AC
  Administered 2016-10-23: 6 mg via SUBCUTANEOUS
  Filled 2016-10-23: qty 0.6

## 2016-10-23 MED ORDER — PALONOSETRON HCL INJECTION 0.25 MG/5ML
INTRAVENOUS | Status: AC
  Filled 2016-10-23: qty 5

## 2016-10-23 NOTE — Patient Instructions (Signed)
[facility] Discharge Instructions for Patients Receiving Chemotherapy  Today you received the following chemotherapy agents: Adraimycin and Cytoxan   To help prevent nausea and vomiting after your treatment, we encourage you to take your nausea medication as directed.    If you develop nausea and vomiting that is not controlled by your nausea medication, call the clinic.   BELOW ARE SYMPTOMS THAT SHOULD BE REPORTED IMMEDIATELY:  *FEVER GREATER THAN 100.5 F  *CHILLS WITH OR WITHOUT FEVER  NAUSEA AND VOMITING THAT IS NOT CONTROLLED WITH YOUR NAUSEA MEDICATION  *UNUSUAL SHORTNESS OF BREATH  *UNUSUAL BRUISING OR BLEEDING  TENDERNESS IN MOUTH AND THROAT WITH OR WITHOUT PRESENCE OF ULCERS  *URINARY PROBLEMS  *BOWEL PROBLEMS  UNUSUAL RASH Items with * indicate a potential emergency and should be followed up as soon as possible.  Feel free to call the clinic you have any questions or concerns. The clinic phone number is [phone].  Please show the [facility] at check-in to the Emergency Department and triage nurse.

## 2016-10-23 NOTE — Progress Notes (Signed)
Patient Care Team: Nolene Ebbs, MD as PCP - General (Internal Medicine) Autumn Messing III, MD as Consulting Physician (General Surgery) Nicholas Lose, MD as Consulting Physician (Hematology and Oncology) Kyung Rudd, MD as Consulting Physician (Radiation Oncology) Gardenia Phlegm, NP as Nurse Practitioner (Hematology and Oncology)  DIAGNOSIS:  Encounter Diagnosis  Name Primary?  . Malignant neoplasm of upper-outer quadrant of right breast in female, estrogen receptor positive ([place]) Yes    SUMMARY OF ONCOLOGIC HISTORY:   Breast cancer of upper-outer quadrant of right female breast ([place])   07/24/2016 Initial Diagnosis    Palpable right breast mass for 2 months UOQ at 10:00: 4.7 x 4.1 x 3.7 cm with enlarged axillary lymph node biopsy benign, breast mass biopsy grade 3 IDC ER 30% week PR negative HER-2 negative Ki-67 90%       Chemotherapy    Dose dense Adriamycin and Cytoxan 4 followed by weekly Abraxane 12      08/26/2016 Surgery    Right mastectomy: IDC grade 3, 7 cm, margins negative, 1/17 nodes positive, ER weakly +30%, PR negative, HER-2 negative, Ki-67 90%, T3 N1 stage IIIa      09/25/2016 -  Chemotherapy     Dose dense Adriamycin Cytoxan 4 followed y weelyAbraxane 12        CHIEF COMPLIANT: Cycle 3 dose dense Adriamycin and Cytoxan  INTERVAL HISTORY: Casey Olson is a 14 year with above-mentioned history of right breast cancer currently on adjuvant chemotherapy and today is cycle 3 of dose dense AC.  She is doing well today.  She has occasional LUQ discomfort.  It resolves with rubbing her abdomen.  She is doing well otherwise and tolerated cycle 2 of treatment without any difficulty.    REVIEW OF SYSTEMS:   Review of Systems  Constitutional: Negative for chills, diaphoresis, fever, malaise/fatigue and weight loss.  HENT: Negative for hearing loss and tinnitus.   Eyes: Negative for blurred vision and double vision.  Respiratory: Negative for cough and  shortness of breath.   Cardiovascular: Negative for chest pain and palpitations.  Gastrointestinal: Negative for abdominal pain, blood in stool, constipation, diarrhea, heartburn, melena, nausea and vomiting.  Genitourinary: Negative for dysuria.  Skin: Negative for rash.  Neurological: Negative for dizziness, tingling, weakness and headaches.  Psychiatric/Behavioral: Negative for depression. The patient is not nervous/anxious.      I have reviewed the past medical history, past surgical history, social history and family history with the patient and they are unchanged from previous note.  ALLERGIES:  is allergic to percocet [oxycodone-acetaminophen].  MEDICATIONS:  Current Outpatient Prescriptions  Medication Sig Dispense Refill  . acetaminophen (TYLENOL) 325 MG tablet Take 650 mg by mouth every 6 (six) hours as needed for mild pain.    Marland Kitchen aspirin EC 81 MG tablet Take 81 mg by mouth daily.    . BD PEN NEEDLE NANO U/F 32G X 4 MM MISC USE TO INJECT INSULIN EVERY DAY (E11.65)  5  . Blood Glucose Monitoring Suppl (ONETOUCH VERIO FLEX SYSTEM) w/Device KIT USE TO TEST 3 TIMES DAILY DX E11.65  11  . Calcium Carbonate-Vitamin D (CALTRATE 600+D PO) Take 1 tablet by mouth 2 (two) times daily.    Marland Kitchen gabapentin (NEURONTIN) 100 MG capsule Take 1 capsule (100 mg total) by mouth 3 (three) times daily. 60 capsule 2  . Hyprom-Naphaz-Polysorb-Zn Sulf (CLEAR EYES COMPLETE OP) Apply 2 drops to eye 2 (two) times daily as needed (irritation).    Marland Kitchen LANTUS SOLOSTAR 100 UNIT/ML Solostar  Pen INJECT 20 UNITS IN THE MORNING  5  . lidocaine-prilocaine (EMLA) cream Apply to affected area once 30 g 3  . loratadine (CLARITIN) 10 MG tablet Take 10 mg by mouth daily as needed for allergies or rhinitis.    Marland Kitchen LORazepam (ATIVAN) 0.5 MG tablet Take 1 tablet (0.5 mg total) by mouth every 6 (six) hours as needed (Nausea or vomiting). 30 tablet 0  . losartan (COZAAR) 50 MG tablet Take 50 mg by mouth daily.  5  . metFORMIN  (GLUCOPHAGE) 1000 MG tablet Take 1,000 mg by mouth 2 (two) times daily.  5  . Multiple Vitamin (MULTIVITAMIN WITH MINERALS) TABS tablet Take 1 tablet by mouth daily.    . ondansetron (ZOFRAN) 8 MG tablet Take 1 tablet (8 mg total) by mouth 2 (two) times daily as needed. Start on the third day after chemotherapy. 30 tablet 1  . ONETOUCH DELICA LANCETS FINE MISC USE TO TEST 3 TIMES DAILY DX E11.65  11  . ONETOUCH VERIO test strip USE TO TEST 3 TIMES DAILY DX E11.65  11  . prochlorperazine (COMPAZINE) 10 MG tablet Take 1 tablet (10 mg total) by mouth every 6 (six) hours as needed (Nausea or vomiting). 30 tablet 1  . simvastatin (ZOCOR) 10 MG tablet Take 10 mg by mouth daily at 6 PM.   2   No current facility-administered medications for this visit.    Facility-Administered Medications Ordered in Other Visits  Medication Dose Route Frequency Provider Last Rate Last Dose  . sodium chloride flush (NS) 0.9 % injection 10 mL  10 mL Intravenous PRN Nicholas Lose, MD   10 mL at 09/25/16 1112    PHYSICAL EXAMINATION: ECOG PERFORMANCE STATUS: 1 - Symptomatic but completely ambulatory  Vitals:   10/23/16 0950  BP: (!) 127/56  Pulse: 89  Resp: 18  Temp: 98.1 F (36.7 C)   Filed Weights   10/23/16 0950  Weight: 157 lb 3.2 oz (71.3 kg)   GENERAL: Patient is a well appearing female in no acute distress HEENT:  Sclerae anicteric.  PERRL.  Oropharynx clear and moist. No ulcerations or evidence of oropharyngeal candidiasis. Neck is supple.  NODES:  No cervical, supraclavicular, or axillary lymphadenopathy palpated.  BREAST EXAM:  Deferred. LUNGS:  Clear to auscultation bilaterally.  No wheezes or rhonchi. HEART:  Regular rate and rhythm. No murmur appreciated. ABDOMEN:  Soft, nontender.  Positive, normoactive bowel sounds. No organomegaly palpated. MSK:  No focal spinal tenderness to palpation.  EXTREMITIES:  No peripheral edema.   SKIN:  Clear with no obvious rashes or skin changes. No nail  dyscrasia. NEURO:  Nonfocal. Well oriented.  Appropriate affect.    LABORATORY DATA:  I have reviewed the data as listed   Chemistry      Component Value Date/Time   NA 136 10/09/2016 0907   K 3.8 10/09/2016 0907   CL 101 08/21/2016 1333   CO2 23 10/09/2016 0907   BUN 10.9 10/09/2016 0907   CREATININE 0.7 10/09/2016 0907      Component Value Date/Time   CALCIUM 9.3 10/09/2016 0907   ALKPHOS 108 10/09/2016 0907   AST 10 10/09/2016 0907   ALT 12 10/09/2016 0907   BILITOT 0.23 10/09/2016 0907       Lab Results  Component Value Date   WBC 17.4 (H) 10/23/2016   HGB 10.4 (L) 10/23/2016   HCT 31.9 (L) 10/23/2016   MCV 84.5 10/23/2016   PLT 203 10/23/2016   NEUTROABS 13.7 (H) 10/23/2016  ASSESSMENT & PLAN:  Breast cancer of upper-outer quadrant of right female breast ([place]) 07/24/2016: Palpable right breast mass for 2 months UOQ at 10:00: 4.7 x 4.1 x 3.7 cm with enlarged axillary lymph node biopsy benign, breast mass biopsy grade 3 IDC ER 30% week PR negative HER-2 negative Ki-67 90% CT CAP and Bone scan: No mets  Treatment plan based on multidisciplinary tumor board: 1. Right mastectomy01/24/2018: IDC grade 3, 7 cm, margins negative, 1/17 nodes positive, ER weakly +30%, PR negative, HER-2 negative, Ki-67 90%, T3 N1 stage IIIa 2. Adjuvant chemotherapy with Adriamycin and Cytoxan dose dense 4 followed by Abraxane weekly 12 (Patient cannot take steroids due to diabetes and so we are not using Taxol) 3. Followed by adjuvant radiation therapy 4. Followed by adjuvant antiestrogen therapy -------------------------------------------------------------------------------------------------------------------------- Current treatment: Cycle 3 day 1 dose dense Adriamycin Cytoxan Echocardiogram 09/16/2016: EF 60-65 %  Chemotherapy toxicities: Casey Olson is tolerating chemotherapy very well. Her labs are normal, I reviewed them with her in detail.  ? LUQ pain is gas pain, will monitor.   She will proceed with cycle 3 today of AC.    I spent 25 minutes talking to the patient of which more than half was spent in counseling and coordination of care.   The patient has a good understanding of the overall plan. she agrees with it. she will call with any problems that may develop before the next visit here.   Scot Dock, NP 10/23/16

## 2016-10-29 ENCOUNTER — Encounter: Payer: Self-pay | Admitting: Physical Therapy

## 2016-10-29 ENCOUNTER — Ambulatory Visit: Payer: Medicare Other | Admitting: Physical Therapy

## 2016-10-29 DIAGNOSIS — Z483 Aftercare following surgery for neoplasm: Secondary | ICD-10-CM

## 2016-10-29 DIAGNOSIS — R609 Edema, unspecified: Secondary | ICD-10-CM

## 2016-10-29 DIAGNOSIS — M25611 Stiffness of right shoulder, not elsewhere classified: Secondary | ICD-10-CM

## 2016-10-29 DIAGNOSIS — M25511 Pain in right shoulder: Secondary | ICD-10-CM

## 2016-10-29 DIAGNOSIS — M6281 Muscle weakness (generalized): Secondary | ICD-10-CM

## 2016-10-29 NOTE — Therapy (Signed)
[place], [place], [address] Phone: [phone]   Fax:  [phone]  Physical Therapy Treatment  Patient Details  Name: Casey Olson MRN: [id number] Date of Birth: [birth year] Referring Provider: Dr. Marlou Starks  Encounter Date: 10/29/2016      PT End of Session - 10/29/16 1103    Visit Number 8   Number of Visits 13   Date for PT Re-Evaluation 12/10/16   PT Start Time 1016   PT Stop Time 1100   PT Time Calculation (min) 44 min   Activity Tolerance Patient tolerated treatment well   Behavior During Therapy [facility] for tasks assessed/performed      Past Medical History:  Diagnosis Date  . Bronchitis   . Cancer [facility])    right breast  . Diabetes mellitus without complication ([place])    type 2  . GERD (gastroesophageal reflux disease)   . High cholesterol   . Hypertension     Past Surgical History:  Procedure Laterality Date  . ABDOMINAL HYSTERECTOMY    . COLONOSCOPY    . MASTECTOMY W/ SENTINEL NODE BIOPSY Right 08/26/2016   Procedure: RIGHT MASTECTOMY WITH SENTINEL LYMPH NODE BIOPSY;  Surgeon: Autumn Messing III, MD;  Location: [place];  Service: General;  Laterality: Right;  . PORTACATH PLACEMENT Left 08/26/2016   Procedure: INSERTION PORT-A-CATH;  Surgeon: Autumn Messing III, MD;  Location: [place];  Service: General;  Laterality: Left;  . TUBAL LIGATION    . WISDOM TOOTH EXTRACTION      There were no vitals filed for this visit.      Subjective Assessment - 10/29/16 1020    Subjective I have been doing fine. I still feel like I am having trouble with my arm. I am having trouble reaching up and out. I wore the TG soft a little. I think it helped but I still have cording.    Pertinent History Patient was diagnosed on 07/23/16 with right grade 3 invasive ductal carcinoma breast cancer.  It is located in the upper outer quadrant and measures 4.7 cm, is ER positive and PR negative, HEr2 negative and has a Ki67 of  90%.  Pt underwent an right mastectomy on 08/26/2017 they took out 17 lymph nodes with 1 positive. Past pressure includes DM, high blood pressure and high cholesterol  Pt will start chemo on Feb. 23, but hopes to not have radiation.    Patient Stated Goals to be able to move her arm like she used to    Currently in Pain? No/denies   Pain Score 0-No pain                  Katina Dung - 10/29/16 0001    Open a tight or new jar Moderate difficulty   Do heavy household chores (wash walls, wash floors) Moderate difficulty   Carry a shopping bag or briefcase Mild difficulty   Wash your back Mild difficulty   Use a knife to cut food Mild difficulty   Recreational activities in which you take some force or impact through your arm, shoulder, or hand (golf, hammering, tennis) Unable   During the past week, to what extent has your arm, shoulder or hand problem interfered with your normal social activities with family, friends, neighbors, or groups? Modererately   During the past week, to what extent has your arm, shoulder or hand problem limited your work or other regular daily activities Modererately   Arm, shoulder, or hand pain. Moderate  Tingling (pins and needles) in your arm, shoulder, or hand Mild   Difficulty Sleeping Mild difficulty   DASH Score 43.18 %               [facility] Adult PT Treatment/Exercise - 10/29/16 0001      Shoulder Exercises: Supine   Horizontal ABduction Strengthening;Both;10 reps;Theraband   Theraband Level (Shoulder Horizontal ABduction) Level 1 (Yellow)   External Rotation Strengthening;Both;10 reps;Theraband   Theraband Level (Shoulder External Rotation) Level 1 (Yellow)   Internal Rotation Strengthening;Both;10 reps;Theraband   Theraband Level (Shoulder Internal Rotation) Level 1 (Yellow)   Flexion Strengthening;Both;10 reps;Theraband  narrow and wide grip   Theraband Level (Shoulder Flexion) Level 1 (Yellow)     Manual Therapy   Manual Therapy  Manual Lymphatic Drainage (MLD);Passive ROM   Manual therapy comments visible and palpable cording in Right axilla, upper arm,  antecubital fossa and forearm    Manual Lymphatic Drainage (MLD) 5 diaphragmatic breaths, In supine, right short neck ( left upper quadrant avoided due to port placement) right inguinal nodes and establishment of axillo inguinal pathway, right interaxiallry nodes and establisment of interaxillary  anastamosis, right shoulder, upper arm, lower arm with myofascial stretch at areas of cording    Passive ROM In Supine to Rt shoulder in direction of flexion, abduction, and er all to pts tolerance. Passive prolonged stretching to areas of cording down into arm                    Short Term Clinic Goals - 10/29/16 1021      CC Short Term Goal  #1   Title Patient with verbalize an understanding of lymphedema risk reduction precautions   Baseline 10/29/16- pt states she is familiar with these items   Time 3   Period Weeks   Status Achieved     CC Short Term Goal  #2   Title Patient will be independent in a basic  home exercise program   Status Achieved           Breast Clinic Goals - 08/05/16 1135      Patient will be able to verbalize understanding of pertinent lymphedema risk reduction practices relevant to her diagnosis specifically related to skin care.   Time 1   Period Days   Status Achieved     Patient will be able to return demonstrate and/or verbalize understanding of the post-op home exercise program related to regaining shoulder range of motion.   Time 1   Period Days   Status Achieved     Patient will be able to verbalize understanding of the importance of attending the postoperative After Breast Cancer Class for further lymphedema risk reduction education and therapeutic exercise.   Time 1   Period Days   Status Achieved          Long Term Clinic Goals - 10/29/16 1026      CC Long Term Goal  #1   Title Patient will improve right  shoulder flexion range of motion to 150 degrees to perform activities of daily living and household chores with greater ease   Baseline 75 degrees at eval; 93 degrees-10/08/16, 10/29/16- 126 degrees   Time 6   Period Weeks   Status On-going     CC Long Term Goal  #2   Title Patient will improve right shoulder abduction to 120 degrees so that she can be independent in dressing    Baseline 73 degrees at eval; 92 degrees-10/08/16, 10/29/16- 105  degrees   Time 6   Period Weeks   Status On-going     CC Long Term Goal  #3   Title Patient will decrease the DASH score to < 38   to demonstrate increased functional use of upper extremity   Baseline 54, 10/29/16- 43   Time 6   Period Weeks   Status On-going            Plan - 10/29/16 1104    Clinical Impression Statement Recert performed today. Assessed pt's progress towards goals in therapy. Pt has made progress towards all goals and her ROM is improving since evaluation. She demonstrates numerous thick cords especially at her antecubital fossa which limit her ROM. She is still very tight with right shoulder flexion and abduction and has trouble reaching up and out to the side. Pt would benefit from continued skilled PT services to improve R shoulder ROM and strength and to decrease cording. Pt plans on attending ABC class on April 5th.    Rehab Potential Good   Clinical Impairments Affecting Rehab Potential 17 nodes removed.  will have ongoing chemotherapy    PT Frequency 1x / week   PT Duration 6 weeks   PT Treatment/Interventions ADLs/Self Care Home Management;Patient/family education;Orthotic Fit/Training;Taping;Manual techniques;Manual lymph drainage;Compression bandaging;Passive range of motion;Neuromuscular re-education;Therapeutic exercise;Therapeutic activities;Functional mobility training   PT Next Visit Plan Cont AA/A/P/ROM to right shoulder with myofascial/MLD for cording, Continue protraciton strengthening, Discuss prophylacic  compression sleeve.    PT Home Exercise Plan Post op shoulder ROM HEP and supine scapular series with yellow theraband   Consulted and Agree with Plan of Care Patient      Patient will benefit from skilled therapeutic intervention in order to improve the following deficits and impairments:  Postural dysfunction, Decreased knowledge of precautions, Pain, Impaired UE functional use, Decreased range of motion, Decreased skin integrity, Increased edema, Decreased strength, Decreased knowledge of use of DME, Decreased activity tolerance  Visit Diagnosis: Aftercare following surgery for neoplasm - Plan: PT plan of care cert/re-cert  Edema, unspecified type - Plan: PT plan of care cert/re-cert  Acute pain of right shoulder - Plan: PT plan of care cert/re-cert  Stiffness of right shoulder, not elsewhere classified - Plan: PT plan of care cert/re-cert  Muscle weakness (generalized) - Plan: PT plan of care cert/re-cert     Problem List Patient Active Problem List   Diagnosis Date Noted  . Port catheter in place 09/25/2016  . Malignant neoplasm of upper-outer quadrant of right breast in female, estrogen receptor positive ([place]) 08/05/2016  . Breast cancer of upper-outer quadrant of right female breast ([place]) 07/31/2016    Allyson Sabal [facility] 10/29/2016, 12:37 PM  [place], [place], [address] Phone: [phone]   Fax:  [phone]  Name: Bob Eastwood MRN: [id number] Date of Birth: 06-03-1951  Manus Gunning, PT 10/29/16 12:38 PM

## 2016-11-05 ENCOUNTER — Ambulatory Visit: Payer: Medicare Other | Attending: General Surgery | Admitting: Physical Therapy

## 2016-11-05 DIAGNOSIS — R293 Abnormal posture: Secondary | ICD-10-CM | POA: Diagnosis present

## 2016-11-05 DIAGNOSIS — R609 Edema, unspecified: Secondary | ICD-10-CM | POA: Insufficient documentation

## 2016-11-05 DIAGNOSIS — M25511 Pain in right shoulder: Secondary | ICD-10-CM | POA: Diagnosis present

## 2016-11-05 DIAGNOSIS — Z483 Aftercare following surgery for neoplasm: Secondary | ICD-10-CM

## 2016-11-05 DIAGNOSIS — M25611 Stiffness of right shoulder, not elsewhere classified: Secondary | ICD-10-CM | POA: Diagnosis present

## 2016-11-05 DIAGNOSIS — M6281 Muscle weakness (generalized): Secondary | ICD-10-CM | POA: Diagnosis present

## 2016-11-05 NOTE — Therapy (Signed)
[place], [place], [address] Phone: [phone]   Fax:  [phone]  Physical Therapy Treatment  Patient Details  Name: Casey Olson MRN: [id number] Date of Birth: Dec 15, 1950 Referring Provider: Dr. Marlou Starks  Encounter Date: 11/05/2016      PT End of Session - 11/05/16 1722    Visit Number 9   Number of Visits 13   Date for PT Re-Evaluation 12/10/16   PT Start Time 1102   PT Stop Time 1100   PT Time Calculation (min) 45 min   Activity Tolerance Patient tolerated treatment well   Behavior During Therapy [facility] for tasks assessed/performed      Past Medical History:  Diagnosis Date  . Bronchitis   . Cancer [facility])    right breast  . Diabetes mellitus without complication ([place])    type 2  . GERD (gastroesophageal reflux disease)   . High cholesterol   . Hypertension     Past Surgical History:  Procedure Laterality Date  . ABDOMINAL HYSTERECTOMY    . COLONOSCOPY    . MASTECTOMY W/ SENTINEL NODE BIOPSY Right 08/26/2016   Procedure: RIGHT MASTECTOMY WITH SENTINEL LYMPH NODE BIOPSY;  Surgeon: Autumn Messing III, MD;  Location: [place];  Service: General;  Laterality: Right;  . PORTACATH PLACEMENT Left 08/26/2016   Procedure: INSERTION PORT-A-CATH;  Surgeon: Autumn Messing III, MD;  Location: [place];  Service: General;  Laterality: Left;  . TUBAL LIGATION    . WISDOM TOOTH EXTRACTION      There were no vitals filed for this visit.      Subjective Assessment - 11/05/16 1023    Subjective Pt came to classs ABC  on Monday and says she did alot of exercise and felt some soreness and wasn't able to use her arm much She feels better today , but the cording and pulling of soft tissue down into her forearm is still visible and painful  She has only been wearing the Tg soft a little bit   Pt states "I gotta due more and push myself"    Patient is accompained by: Family member   Pertinent History Patient was diagnosed on  07/23/16 with right grade 3 invasive ductal carcinoma breast cancer.  It is located in the upper outer quadrant and measures 4.7 cm, is ER positive and PR negative, HEr2 negative and has a Ki67 of 90%.  Pt underwent an right mastectomy on 08/26/2017 they took out 17 lymph nodes with 1 positive. Past pressure includes DM, high blood pressure and high cholesterol  Pt will start chemo on Feb. 23, but hopes to not have radiation.    Patient Stated Goals to be able to move her arm like she used to    Currently in Pain? No/denies                         [facility] Adult PT Treatment/Exercise - 11/05/16 0001      Self-Care   Self-Care Other Self-Care Comments   Other Self-Care Comments  applied small Tg soft to right arm from hand to axilla and pt asked to monitor her arm for cording or smoothness of tissue after she removes it.     Shoulder Exercises: Supine   Protraction AROM;Right;10 reps;Weights   Protraction Weight (lbs) 2   Other Supine Exercises small circles with control with hand pointed to ceiling.  with no weight and while holding 2# weight , then 4# weight  Other Supine Exercises elbow flexion and extension      Shoulder Exercises: Sidelying   External Rotation AROM;Right;5 reps  also isomtrics with 2# and 4#  5 repetitions each    ABduction AAROM;Right;5 reps   ABduction Limitations difficulty due muscle weakness    Other Sidelying Exercises 5 small circles in each direction with arm pointing to ceilling. needed some assistance. to hold arm up and for control      Hand Exercises   Other Hand Exercises 10 reps of sqeezing a ball with elbow extended, wrist flexion and exension wit 2# withd      Manual Therapy   Manual Therapy Manual Lymphatic Drainage (MLD);Passive ROM   Manual therapy comments visible and palpable cording in Right axilla, upper arm,  antecubital fossa and forearm    Manual Lymphatic Drainage (MLD) 5 diaphragmatic breaths, In supine, right short neck (  left upper quadrant avoided due to port placement) right inguinal nodes and establishment of axillo inguinal pathway, right interaxiallry nodes and establisment of interaxillary  anastamosis, right shoulder, upper arm, lower arm with myofascial stretch at areas of cording    Passive ROM In Supine to Rt shoulder in direction of flexion, abduction, and er all to pts tolerance. Passive prolonged stretching to areas of cording down into arm                    Short Term Clinic Goals - 10/29/16 1021      CC Short Term Goal  #1   Title Patient with verbalize an understanding of lymphedema risk reduction precautions   Baseline 10/29/16- pt states she is familiar with these items   Time 3   Period Weeks   Status Achieved     CC Short Term Goal  #2   Title Patient will be independent in a basic  home exercise program   Status Achieved           Breast Clinic Goals - 08/05/16 1135      Patient will be able to verbalize understanding of pertinent lymphedema risk reduction practices relevant to her diagnosis specifically related to skin care.   Time 1   Period Days   Status Achieved     Patient will be able to return demonstrate and/or verbalize understanding of the post-op home exercise program related to regaining shoulder range of motion.   Time 1   Period Days   Status Achieved     Patient will be able to verbalize understanding of the importance of attending the postoperative After Breast Cancer Class for further lymphedema risk reduction education and therapeutic exercise.   Time 1   Period Days   Status Achieved          Long Term Clinic Goals - 10/29/16 1026      CC Long Term Goal  #1   Title Patient will improve right shoulder flexion range of motion to 150 degrees to perform activities of daily living and household chores with greater ease   Baseline 75 degrees at eval; 93 degrees-10/08/16, 10/29/16- 126 degrees   Time 6   Period Weeks   Status On-going      CC Long Term Goal  #2   Title Patient will improve right shoulder abduction to 120 degrees so that she can be independent in dressing    Baseline 73 degrees at eval; 92 degrees-10/08/16, 10/29/16- 105 degrees   Time 6   Period Weeks   Status On-going  CC Long Term Goal  #3   Title Patient will decrease the DASH score to < 38   to demonstrate increased functional use of upper extremity   Baseline 54, 10/29/16- 43   Time 6   Period Weeks   Status On-going            Plan - 11/05/16 1722    Clinical Impression Statement Pt continues to have tightness with visible cording througout the soft tissue of her upper arm, antecubital fossa, foreaem and wrist with tightness and decreased range of motion in her shoulder.  She admits she needs to do more at home and she has only been able to come therapy one time a week due to chemo. Anticipate she will have prolonged recovery.  Upgraded program to include more active movement of shoulder , elbow, wrist and hand and encouraged patient to wear tg soft for lymphatic support and use her arm as much as she can.   Rehab Potential Good   Clinical Impairments Affecting Rehab Potential 17 nodes removed.  will have ongoing chemotherapy    PT Frequency 1x / week   PT Duration 6 weeks   PT Treatment/Interventions ADLs/Self Care Home Management;Patient/family education;Orthotic Fit/Training;Taping;Manual techniques;Manual lymph drainage;Compression bandaging;Passive range of motion;Neuromuscular re-education;Therapeutic exercise;Therapeutic activities;Functional mobility training   PT Next Visit Plan Cont AA/A/P/ROM to right shoulder with myofascial/MLD for cording, Continue protraction and shoulder strengthening, Discuss prophylacic compression sleeve.    Consulted and Agree with Plan of Care Patient      Patient will benefit from skilled therapeutic intervention in order to improve the following deficits and impairments:  Postural dysfunction, Decreased  knowledge of precautions, Pain, Impaired UE functional use, Decreased range of motion, Decreased skin integrity, Increased edema, Decreased strength, Decreased knowledge of use of DME, Decreased activity tolerance  Visit Diagnosis: Aftercare following surgery for neoplasm  Edema, unspecified type  Acute pain of right shoulder  Stiffness of right shoulder, not elsewhere classified  Muscle weakness (generalized)  Abnormal posture     Problem List Patient Active Problem List   Diagnosis Date Noted  . Port catheter in place 09/25/2016  . Malignant neoplasm of upper-outer quadrant of right breast in female, estrogen receptor positive ([place]) 08/05/2016  . Breast cancer of upper-outer quadrant of right female breast ([place]) 07/31/2016   Donato Heinz. Owens Shark PT  [facility] 11/05/2016, [place], [place], [address] Phone: [phone]   Fax:  [phone]  Name: Casey Olson MRN: [id number] Date of Birth: October 27, 1950

## 2016-11-05 NOTE — Patient Instructions (Signed)
Ball squeeze  Lying on back and also when lying on side. Hand to the ceiling and do small controlled circles [place] 5 times in each direction

## 2016-11-06 ENCOUNTER — Ambulatory Visit: Payer: Medicare Other

## 2016-11-06 ENCOUNTER — Telehealth: Payer: Self-pay | Admitting: Hematology and Oncology

## 2016-11-06 ENCOUNTER — Ambulatory Visit (HOSPITAL_BASED_OUTPATIENT_CLINIC_OR_DEPARTMENT_OTHER): Payer: Medicare Other

## 2016-11-06 ENCOUNTER — Other Ambulatory Visit (HOSPITAL_BASED_OUTPATIENT_CLINIC_OR_DEPARTMENT_OTHER): Payer: Medicare Other

## 2016-11-06 ENCOUNTER — Ambulatory Visit (HOSPITAL_BASED_OUTPATIENT_CLINIC_OR_DEPARTMENT_OTHER): Payer: Medicare Other | Admitting: Adult Health

## 2016-11-06 ENCOUNTER — Encounter: Payer: Self-pay | Admitting: Adult Health

## 2016-11-06 VITALS — BP 146/82 | HR 83 | Temp 98.4°F | Resp 18 | Ht 63.0 in | Wt 158.5 lb

## 2016-11-06 DIAGNOSIS — K5900 Constipation, unspecified: Secondary | ICD-10-CM

## 2016-11-06 DIAGNOSIS — C50411 Malignant neoplasm of upper-outer quadrant of right female breast: Secondary | ICD-10-CM

## 2016-11-06 DIAGNOSIS — Z170 Estrogen receptor positive status [ER+]: Secondary | ICD-10-CM | POA: Diagnosis not present

## 2016-11-06 DIAGNOSIS — Z5111 Encounter for antineoplastic chemotherapy: Secondary | ICD-10-CM | POA: Diagnosis not present

## 2016-11-06 DIAGNOSIS — C773 Secondary and unspecified malignant neoplasm of axilla and upper limb lymph nodes: Secondary | ICD-10-CM

## 2016-11-06 DIAGNOSIS — Z95828 Presence of other vascular implants and grafts: Secondary | ICD-10-CM

## 2016-11-06 LAB — CBC WITH DIFFERENTIAL/PLATELET
BASO%: 0.4 % (ref 0.0–2.0)
BASOS ABS: 0.1 10*3/uL (ref 0.0–0.1)
EOS ABS: 0.1 10*3/uL (ref 0.0–0.5)
EOS%: 0.9 % (ref 0.0–7.0)
HCT: 30 % — ABNORMAL LOW (ref 34.8–46.6)
HGB: 10 g/dL — ABNORMAL LOW (ref 11.6–15.9)
LYMPH%: 5.3 % — AB (ref 14.0–49.7)
MCH: 28.6 pg (ref 25.1–34.0)
MCHC: 33.2 g/dL (ref 31.5–36.0)
MCV: 86.1 fL (ref 79.5–101.0)
MONO#: 1.9 10*3/uL — AB (ref 0.1–0.9)
MONO%: 14.4 % — ABNORMAL HIGH (ref 0.0–14.0)
NEUT%: 79 % — AB (ref 38.4–76.8)
NEUTROS ABS: 10.5 10*3/uL — AB (ref 1.5–6.5)
PLATELETS: 241 10*3/uL (ref 145–400)
RBC: 3.48 10*6/uL — AB (ref 3.70–5.45)
RDW: 18.5 % — ABNORMAL HIGH (ref 11.2–14.5)
WBC: 13.3 10*3/uL — ABNORMAL HIGH (ref 3.9–10.3)
lymph#: 0.7 10*3/uL — ABNORMAL LOW (ref 0.9–3.3)

## 2016-11-06 LAB — COMPREHENSIVE METABOLIC PANEL
ALK PHOS: 97 U/L (ref 40–150)
ALT: 11 U/L (ref 0–55)
ANION GAP: 10 meq/L (ref 3–11)
AST: 11 U/L (ref 5–34)
Albumin: 3.9 g/dL (ref 3.5–5.0)
BILIRUBIN TOTAL: 0.31 mg/dL (ref 0.20–1.20)
BUN: 11.2 mg/dL (ref 7.0–26.0)
CO2: 24 meq/L (ref 22–29)
Calcium: 9.3 mg/dL (ref 8.4–10.4)
Chloride: 106 mEq/L (ref 98–109)
Creatinine: 0.7 mg/dL (ref 0.6–1.1)
Glucose: 129 mg/dl (ref 70–140)
Potassium: 3.8 mEq/L (ref 3.5–5.1)
Sodium: 141 mEq/L (ref 136–145)
TOTAL PROTEIN: 7.1 g/dL (ref 6.4–8.3)

## 2016-11-06 MED ORDER — SODIUM CHLORIDE 0.9 % IV SOLN
Freq: Once | INTRAVENOUS | Status: AC
  Administered 2016-11-06: 11:00:00 via INTRAVENOUS

## 2016-11-06 MED ORDER — PEGFILGRASTIM 6 MG/0.6ML ~~LOC~~ PSKT
6.0000 mg | PREFILLED_SYRINGE | Freq: Once | SUBCUTANEOUS | Status: AC
  Administered 2016-11-06: 6 mg via SUBCUTANEOUS
  Filled 2016-11-06: qty 0.6

## 2016-11-06 MED ORDER — SODIUM CHLORIDE 0.9 % IV SOLN
Freq: Once | INTRAVENOUS | Status: AC
  Administered 2016-11-06: 12:00:00 via INTRAVENOUS
  Filled 2016-11-06: qty 5

## 2016-11-06 MED ORDER — CYCLOPHOSPHAMIDE CHEMO INJECTION 1 GM
600.0000 mg/m2 | Freq: Once | INTRAMUSCULAR | Status: AC
  Administered 2016-11-06: 1120 mg via INTRAVENOUS
  Filled 2016-11-06: qty 56

## 2016-11-06 MED ORDER — HEPARIN SOD (PORK) LOCK FLUSH 100 UNIT/ML IV SOLN
500.0000 [IU] | Freq: Once | INTRAVENOUS | Status: AC | PRN
  Administered 2016-11-06: 500 [IU]
  Filled 2016-11-06: qty 5

## 2016-11-06 MED ORDER — PALONOSETRON HCL INJECTION 0.25 MG/5ML
0.2500 mg | Freq: Once | INTRAVENOUS | Status: AC
  Administered 2016-11-06: 0.25 mg via INTRAVENOUS

## 2016-11-06 MED ORDER — SODIUM CHLORIDE 0.9% FLUSH
10.0000 mL | INTRAVENOUS | Status: DC | PRN
  Administered 2016-11-06: 10 mL
  Filled 2016-11-06: qty 10

## 2016-11-06 MED ORDER — SODIUM CHLORIDE 0.9% FLUSH
10.0000 mL | INTRAVENOUS | Status: DC | PRN
  Administered 2016-11-06: 10 mL via INTRAVENOUS
  Filled 2016-11-06: qty 10

## 2016-11-06 MED ORDER — DOXORUBICIN HCL CHEMO IV INJECTION 2 MG/ML
60.0000 mg/m2 | Freq: Once | INTRAVENOUS | Status: AC
  Administered 2016-11-06: 112 mg via INTRAVENOUS
  Filled 2016-11-06: qty 56

## 2016-11-06 MED ORDER — PALONOSETRON HCL INJECTION 0.25 MG/5ML
INTRAVENOUS | Status: AC
  Filled 2016-11-06: qty 5

## 2016-11-06 NOTE — Progress Notes (Signed)
Cytoxan run over 45 minutes as patient has had problems with sinus burning during cytoxan administration in the past.  Even over 45 minutes, patient still had some burning in sinuses.  Suggest giving sytoxan over an hour with next infusion.

## 2016-11-06 NOTE — Telephone Encounter (Signed)
Appointments complete per 4/6 los. Patient given schedule in infusion area.

## 2016-11-06 NOTE — Patient Instructions (Signed)
[facility] Discharge Instructions for Patients Receiving Chemotherapy  Today you received the following chemotherapy agents: Adriamycin and Cytoxan.  To help prevent nausea and vomiting after your treatment, we encourage you to take your nausea medication prochlorperazine (COMPAZINE) 10 MG tablet every 6 hours as needed for nausea.  Wait 3 days before taking your zofran.  If you develop nausea and vomiting that is not controlled by your nausea medication, call the clinic.   BELOW ARE SYMPTOMS THAT SHOULD BE REPORTED IMMEDIATELY:  *FEVER GREATER THAN 100.5 F  *CHILLS WITH OR WITHOUT FEVER  NAUSEA AND VOMITING THAT IS NOT CONTROLLED WITH YOUR NAUSEA MEDICATION  *UNUSUAL SHORTNESS OF BREATH  *UNUSUAL BRUISING OR BLEEDING  TENDERNESS IN MOUTH AND THROAT WITH OR WITHOUT PRESENCE OF ULCERS  *URINARY PROBLEMS  *BOWEL PROBLEMS  UNUSUAL RASH Items with * indicate a potential emergency and should be followed up as soon as possible.  Feel free to call the clinic you have any questions or concerns. The clinic phone number is [phone].  Please show the [facility] at check-in to the Emergency Department and triage nurse.

## 2016-11-06 NOTE — Progress Notes (Signed)
Patient Care Team: Nolene Ebbs, MD as PCP - General (Internal Medicine) Autumn Messing III, MD as Consulting Physician (General Surgery) Nicholas Lose, MD as Consulting Physician (Hematology and Oncology) Kyung Rudd, MD as Consulting Physician (Radiation Oncology) Gardenia Phlegm, NP as Nurse Practitioner (Hematology and Oncology)  DIAGNOSIS:  Encounter Diagnosis  Name Primary?  . Malignant neoplasm of upper-outer quadrant of right breast in female, estrogen receptor positive ([place]) Yes    SUMMARY OF ONCOLOGIC HISTORY:   Breast cancer of upper-outer quadrant of right female breast ([place])   07/24/2016 Initial Diagnosis    Palpable right breast mass for 2 months UOQ at 10:00: 4.7 x 4.1 x 3.7 cm with enlarged axillary lymph node biopsy benign, breast mass biopsy grade 3 IDC ER 30% week PR negative HER-2 negative Ki-67 90%      08/26/2016 Surgery    Right mastectomy: IDC grade 3, 7 cm, margins negative, 1/17 nodes positive, ER weakly +30%, PR negative, HER-2 negative, Ki-67 90%, T3 N1 stage IIIa      09/25/2016 -  Chemotherapy     Dose dense Adriamycin Cytoxan 4 followed y weelyAbraxane 12        CHIEF COMPLIANT: Cycle 4 dose dense Adriamycin and Cytoxan  INTERVAL HISTORY: Kagan Hietpas is a 33 year with above-mentioned history of right breast cancer currently on adjuvant chemotherapy and today is cycle 4 of dose dense AC. She is doing well today. She continues to have some mild luq fullness and discomfort that is relieved with massage.  She does have some occasional constipation usually relieved with prunes.  She is doing well otherwise and is without any questions or concerns.  She is here with her sister today who asked why she needs radiation now, when she wasn't recommended it at her initial appointment.    REVIEW OF SYSTEMS:   Review of Systems  Constitutional: Negative for chills, diaphoresis, fever, malaise/fatigue and weight loss.  HENT: Negative for hearing loss  and tinnitus.   Eyes: Negative for blurred vision and double vision.  Respiratory: Negative for cough and shortness of breath.   Cardiovascular: Negative for chest pain and palpitations.  Gastrointestinal: Negative for abdominal pain, blood in stool, constipation, diarrhea, heartburn, melena, nausea and vomiting.  Genitourinary: Negative for dysuria.  Skin: Negative for rash.  Neurological: Negative for dizziness, tingling, weakness and headaches.  Psychiatric/Behavioral: Negative for depression. The patient is not nervous/anxious.      I have reviewed the past medical history, past surgical history, social history and family history with the patient and they are unchanged from previous note.  ALLERGIES:  is allergic to percocet [oxycodone-acetaminophen].  MEDICATIONS:  Current Outpatient Prescriptions  Medication Sig Dispense Refill  . acetaminophen (TYLENOL) 325 MG tablet Take 650 mg by mouth every 6 (six) hours as needed for mild pain.    Marland Kitchen aspirin EC 81 MG tablet Take 81 mg by mouth daily.    . BD PEN NEEDLE NANO U/F 32G X 4 MM MISC USE TO INJECT INSULIN EVERY DAY (E11.65)  5  . Blood Glucose Monitoring Suppl (ONETOUCH VERIO FLEX SYSTEM) w/Device KIT USE TO TEST 3 TIMES DAILY DX E11.65  11  . Calcium Carbonate-Vitamin D (CALTRATE 600+D PO) Take 1 tablet by mouth 2 (two) times daily.    Marland Kitchen gabapentin (NEURONTIN) 100 MG capsule Take 1 capsule (100 mg total) by mouth 3 (three) times daily. 60 capsule 2  . Hyprom-Naphaz-Polysorb-Zn Sulf (CLEAR EYES COMPLETE OP) Apply 2 drops to eye 2 (two) times  daily as needed (irritation).    Marland Kitchen LANTUS SOLOSTAR 100 UNIT/ML Solostar Pen INJECT 20 UNITS IN THE MORNING  5  . lidocaine-prilocaine (EMLA) cream Apply to affected area once 30 g 3  . loratadine (CLARITIN) 10 MG tablet Take 10 mg by mouth daily as needed for allergies or rhinitis.    Marland Kitchen LORazepam (ATIVAN) 0.5 MG tablet Take 1 tablet (0.5 mg total) by mouth every 6 (six) hours as needed (Nausea or  vomiting). 30 tablet 0  . losartan (COZAAR) 50 MG tablet Take 50 mg by mouth daily.  5  . metFORMIN (GLUCOPHAGE) 1000 MG tablet Take 1,000 mg by mouth 2 (two) times daily.  5  . Multiple Vitamin (MULTIVITAMIN WITH MINERALS) TABS tablet Take 1 tablet by mouth daily.    . ondansetron (ZOFRAN) 8 MG tablet Take 1 tablet (8 mg total) by mouth 2 (two) times daily as needed. Start on the third day after chemotherapy. 30 tablet 1  . ONETOUCH DELICA LANCETS FINE MISC USE TO TEST 3 TIMES DAILY DX E11.65  11  . ONETOUCH VERIO test strip USE TO TEST 3 TIMES DAILY DX E11.65  11  . prochlorperazine (COMPAZINE) 10 MG tablet Take 1 tablet (10 mg total) by mouth every 6 (six) hours as needed (Nausea or vomiting). 30 tablet 1  . simvastatin (ZOCOR) 10 MG tablet Take 10 mg by mouth daily at 6 PM.   2   No current facility-administered medications for this visit.    Facility-Administered Medications Ordered in Other Visits  Medication Dose Route Frequency Provider Last Rate Last Dose  . sodium chloride flush (NS) 0.9 % injection 10 mL  10 mL Intravenous PRN Nicholas Lose, MD   10 mL at 09/25/16 1112    PHYSICAL EXAMINATION: ECOG PERFORMANCE STATUS: 1 - Symptomatic but completely ambulatory  Vitals:   11/06/16 1025  BP: (!) 146/82  Pulse: 83  Resp: 18  Temp: 98.4 F (36.9 C)   Filed Weights   11/06/16 1025  Weight: 158 lb 8 oz (71.9 kg)   GENERAL: Patient is a well appearing female in no acute distress HEENT:  Sclerae anicteric.  PERRL.  Oropharynx clear and moist. No ulcerations or evidence of oropharyngeal candidiasis. Neck is supple.  NODES:  No cervical, supraclavicular, or axillary lymphadenopathy palpated.  BREAST EXAM:  Deferred. LUNGS:  Clear to auscultation bilaterally.  No wheezes or rhonchi. HEART:  Regular rate and rhythm. No murmur appreciated. ABDOMEN:  Soft, nontender.  Positive, normoactive bowel sounds. No organomegaly palpated. MSK:  No focal spinal tenderness to palpation.    EXTREMITIES:  No peripheral edema.   SKIN:  Clear with no obvious rashes or skin changes. No nail dyscrasia. NEURO:  Nonfocal. Well oriented.  Appropriate affect.    LABORATORY DATA:  I have reviewed the data as listed   Chemistry      Component Value Date/Time   NA 141 11/06/2016 0936   K 3.8 11/06/2016 0936   CL 101 08/21/2016 1333   CO2 24 11/06/2016 0936   BUN 11.2 11/06/2016 0936   CREATININE 0.7 11/06/2016 0936      Component Value Date/Time   CALCIUM 9.3 11/06/2016 0936   ALKPHOS 97 11/06/2016 0936   AST 11 11/06/2016 0936   ALT 11 11/06/2016 0936   BILITOT 0.31 11/06/2016 0936       Lab Results  Component Value Date   WBC 13.3 (H) 11/06/2016   HGB 10.0 (L) 11/06/2016   HCT 30.0 (L) 11/06/2016  MCV 86.1 11/06/2016   PLT 241 11/06/2016   NEUTROABS 10.5 (H) 11/06/2016    ASSESSMENT & PLAN:  Breast cancer of upper-outer quadrant of right female breast ([place]) 07/24/2016: Palpable right breast mass for 2 months UOQ at 10:00: 4.7 x 4.1 x 3.7 cm with enlarged axillary lymph node biopsy benign, breast mass biopsy grade 3 IDC ER 30% week PR negative HER-2 negative Ki-67 90% CT CAP and Bone scan: No mets  Treatment plan based on multidisciplinary tumor board: 1. Right mastectomy01/24/2018: IDC grade 3, 7 cm, margins negative, 1/17 nodes positive, ER weakly +30%, PR negative, HER-2 negative, Ki-67 90%, T3 N1 stage IIIa 2. Adjuvant chemotherapy with Adriamycin and Cytoxan dose dense 4 followed by Abraxane weekly 12 (Patient cannot take steroids due to diabetes and so we are not using Taxol) 3. Followed by adjuvant radiation therapy 4. Followed by adjuvant antiestrogen therapy -------------------------------------------------------------------------------------------------------------------------- Current treatment: Cycle 4 day 1 dose dense Adriamycin Cytoxan Echocardiogram 09/16/2016: EF 60-65 %, next appointment in May, 2018  Chemotherapy toxicities: [place] is  tolerating chemotherapy very well.  I reviewed her cbc with her which is normal (CMET pending).  She will proceed with cycle four of AC today.  We reviewed her cancer details and the fact that she had stage 3 cancer, a large tumor and a positive lymph node.  These are all reasons she will need radiation.  I reviewed her staging scans which were normal.  She verbalized understanding of this.  She will proceed with chemotherapy today and return in 2 weeks for lab, evaluation by Dr. Lindi Adie, and start Abraxane chemotherapy.     I spent 25 minutes talking to the patient of which more than half was spent in counseling and coordination of care.   The patient has a good understanding of the overall plan. she agrees with it. she will call with any problems that may develop before the next visit here.   Scot Dock, NP 11/06/16

## 2016-11-12 ENCOUNTER — Ambulatory Visit: Payer: Medicare Other | Admitting: Physical Therapy

## 2016-11-12 ENCOUNTER — Encounter: Payer: Self-pay | Admitting: Physical Therapy

## 2016-11-12 DIAGNOSIS — M25511 Pain in right shoulder: Secondary | ICD-10-CM

## 2016-11-12 DIAGNOSIS — M6281 Muscle weakness (generalized): Secondary | ICD-10-CM

## 2016-11-12 DIAGNOSIS — Z483 Aftercare following surgery for neoplasm: Secondary | ICD-10-CM | POA: Diagnosis not present

## 2016-11-12 DIAGNOSIS — R609 Edema, unspecified: Secondary | ICD-10-CM

## 2016-11-12 DIAGNOSIS — M25611 Stiffness of right shoulder, not elsewhere classified: Secondary | ICD-10-CM

## 2016-11-12 NOTE — Therapy (Signed)
[place], [place], [address] Phone: [phone]   Fax:  [phone]  Physical Therapy Treatment  Patient Details  Name: Casey Olson MRN: [id number] Date of Birth: Dec 28, 1950 Referring Provider: Dr. Marlou Starks  Encounter Date: 11/12/2016      PT End of Session - 11/12/16 1109    Visit Number 10   Number of Visits 13   Date for PT Re-Evaluation 12/10/16   PT Start Time 1021   PT Stop Time 1106   PT Time Calculation (min) 45 min   Activity Tolerance Patient tolerated treatment well   Behavior During Therapy [facility] for tasks assessed/performed      Past Medical History:  Diagnosis Date  . Bronchitis   . Cancer [facility])    right breast  . Diabetes mellitus without complication ([place])    type 2  . GERD (gastroesophageal reflux disease)   . High cholesterol   . Hypertension     Past Surgical History:  Procedure Laterality Date  . ABDOMINAL HYSTERECTOMY    . COLONOSCOPY    . MASTECTOMY W/ SENTINEL NODE BIOPSY Right 08/26/2016   Procedure: RIGHT MASTECTOMY WITH SENTINEL LYMPH NODE BIOPSY;  Surgeon: Autumn Messing III, MD;  Location: [place];  Service: General;  Laterality: Right;  . PORTACATH PLACEMENT Left 08/26/2016   Procedure: INSERTION PORT-A-CATH;  Surgeon: Autumn Messing III, MD;  Location: [place];  Service: General;  Laterality: Left;  . TUBAL LIGATION    . WISDOM TOOTH EXTRACTION      There were no vitals filed for this visit.      Subjective Assessment - 11/12/16 1023    Subjective My shoulder is doing. I have been wearing that thing she gave me. Since I had chemo Friday I have not been exercising that much because I haven't felt like it. It gets stiff when I dont exercise.    Pertinent History Patient was diagnosed on 07/23/16 with right grade 3 invasive ductal carcinoma breast cancer.  It is located in the upper outer quadrant and measures 4.7 cm, is ER positive and PR negative, HEr2 negative and has a  Ki67 of 90%.  Pt underwent an right mastectomy on 08/26/2017 they took out 17 lymph nodes with 1 positive. Past pressure includes DM, high blood pressure and high cholesterol  Pt will start chemo on Feb. 23, but hopes to not have radiation.    Patient Stated Goals to be able to move her arm like she used to    Currently in Pain? Yes   Pain Score 5    Pain Location Abdomen   Pain Orientation Left   Pain Descriptors / Indicators --  feels like it is bruised   Pain Type Chronic pain   Pain Onset More than a month ago   Pain Frequency Intermittent            [facility] PT Assessment - 11/12/16 0001      AROM   Right Shoulder Flexion 125 Degrees   Right Shoulder ABduction 115 Degrees                     [facility] Adult PT Treatment/Exercise - 11/12/16 0001      Shoulder Exercises: Supine   Protraction AROM;Right;10 reps;Weights   Protraction Weight (lbs) 2   Other Supine Exercises small circles with control with hand pointed to ceiling. while holding 2# weight    Other Supine Exercises elbow flexion and extension  Shoulder Exercises: Sidelying   External Rotation AROM;Right;5 reps   ABduction AAROM;Right;5 reps   Other Sidelying Exercises 5 small circles in each direction with arm pointing to ceilling. needed some assistance. to hold arm up and for control      Manual Therapy   Manual Therapy Manual Lymphatic Drainage (MLD);Passive ROM   Manual therapy comments visible and palpable cording in Right axilla, upper arm,  antecubital fossa and forearm    Manual Lymphatic Drainage (MLD) 5 diaphragmatic breaths, In supine, right short neck ( left upper quadrant avoided due to port placement) right inguinal nodes and establishment of axillo inguinal pathway, right interaxiallry nodes and establisment of interaxillary  anastamosis, right shoulder, upper arm, lower arm with myofascial stretch at areas of cording    Passive ROM In Supine to Rt shoulder in direction of flexion,  abduction to pts tolerance. Passive prolonged stretching to areas of cording down into arm                    Short Term Clinic Goals - 10/29/16 1021      CC Short Term Goal  #1   Title Patient with verbalize an understanding of lymphedema risk reduction precautions   Baseline 10/29/16- pt states she is familiar with these items   Time 3   Period Weeks   Status Achieved     CC Short Term Goal  #2   Title Patient will be independent in a basic  home exercise program   Status Achieved          Long Term Clinic Goals - 11/12/16 1102      CC Long Term Goal  #1   Title Patient will improve right shoulder flexion range of motion to 150 degrees to perform activities of daily living and household chores with greater ease   Baseline 75 degrees at eval; 93 degrees-10/08/16, 10/29/16- 126 degrees, 11/12/16- 125 degrees   Time 6   Period Weeks   Status On-going     CC Long Term Goal  #2   Title Patient will improve right shoulder abduction to 120 degrees so that she can be independent in dressing    Baseline 73 degrees at eval; 92 degrees-10/08/16, 10/29/16- 105 degrees, 11/12/16- 115 degrees   Time 6   Period Weeks   Status On-going     CC Long Term Goal  #3   Title Patient will decrease the DASH score to < 38   to demonstrate increased functional use of upper extremity   Baseline 54, 10/29/16- 43   Time 6   Period Weeks   Status On-going            Plan - 11/12/16 1110    Clinical Impression Statement Assessed pt's progress towards goals in therapy. She demonstrates improvement in RUE flexion and abduction today. She still has considerable cording but her PROM improved greatly with prolonged passive stretching during PROM. Patient encouraged to continue prolonged stretching at home to continue to improve ROM.    Rehab Potential Good   Clinical Impairments Affecting Rehab Potential 17 nodes removed.  will have ongoing chemotherapy    PT Frequency 1x / week   PT Duration 6  weeks   PT Treatment/Interventions ADLs/Self Care Home Management;Patient/family education;Orthotic Fit/Training;Taping;Manual techniques;Manual lymph drainage;Compression bandaging;Passive range of motion;Neuromuscular re-education;Therapeutic exercise;Therapeutic activities;Functional mobility training   PT Next Visit Plan Cont AA/A/P/ROM to right shoulder with myofascial/MLD for cording, Continue protraction and shoulder strengthening, Discuss prophylacic compression sleeve.  PT Home Exercise Plan Post op shoulder ROM HEP and supine scapular series with yellow theraband   Consulted and Agree with Plan of Care Patient      Patient will benefit from skilled therapeutic intervention in order to improve the following deficits and impairments:  Postural dysfunction, Decreased knowledge of precautions, Pain, Impaired UE functional use, Decreased range of motion, Decreased skin integrity, Increased edema, Decreased strength, Decreased knowledge of use of DME, Decreased activity tolerance  Visit Diagnosis: Aftercare following surgery for neoplasm  Edema, unspecified type  Acute pain of right shoulder  Stiffness of right shoulder, not elsewhere classified  Muscle weakness (generalized)       G-Codes - 2016/12/04 1112    Functional Assessment Tool Used (Outpatient Only) clinical judgment   Functional Limitation Carrying, moving and handling objects   Carrying, Moving and Handling Objects Current Status (A0298) At least 40 percent but less than 60 percent impaired, limited or restricted   Carrying, Moving and Handling Objects Goal Status (O7308) At least 20 percent but less than 40 percent impaired, limited or restricted      Problem List Patient Active Problem List   Diagnosis Date Noted  . Port catheter in place 09/25/2016  . Malignant neoplasm of upper-outer quadrant of right breast in female, estrogen receptor positive ([place]) 08/05/2016  . Breast cancer of upper-outer quadrant of  right female breast ([place]) 07/31/2016    Allyson Sabal [facility] Dec 04, 2016, 11:13 AM  [place], [place], [address] Phone: [phone]   Fax:  [phone]  Name: Bev Drennen MRN: [id number] Date of Birth: Dec 08, 1950  Manus Gunning, PT 04-Dec-2016 11:14 AM

## 2016-11-19 ENCOUNTER — Ambulatory Visit: Payer: Medicare Other | Admitting: Physical Therapy

## 2016-11-19 ENCOUNTER — Other Ambulatory Visit: Payer: Self-pay | Admitting: Hematology and Oncology

## 2016-11-19 DIAGNOSIS — R293 Abnormal posture: Secondary | ICD-10-CM

## 2016-11-19 DIAGNOSIS — R609 Edema, unspecified: Secondary | ICD-10-CM

## 2016-11-19 DIAGNOSIS — C50411 Malignant neoplasm of upper-outer quadrant of right female breast: Secondary | ICD-10-CM

## 2016-11-19 DIAGNOSIS — Z483 Aftercare following surgery for neoplasm: Secondary | ICD-10-CM | POA: Diagnosis not present

## 2016-11-19 DIAGNOSIS — M25611 Stiffness of right shoulder, not elsewhere classified: Secondary | ICD-10-CM

## 2016-11-19 DIAGNOSIS — M25511 Pain in right shoulder: Secondary | ICD-10-CM

## 2016-11-19 DIAGNOSIS — M6281 Muscle weakness (generalized): Secondary | ICD-10-CM

## 2016-11-19 DIAGNOSIS — Z170 Estrogen receptor positive status [ER+]: Principal | ICD-10-CM

## 2016-11-19 MED ORDER — LIDOCAINE-PRILOCAINE 2.5-2.5 % EX CREA: TOPICAL_CREAM | CUTANEOUS | 3 refills | Status: DC

## 2016-11-19 MED ORDER — LORAZEPAM 0.5 MG PO TABS: 0.5000 mg | ORAL_TABLET | Freq: Four times a day (QID) | ORAL | 0 refills | Status: DC | PRN

## 2016-11-19 MED ORDER — PROCHLORPERAZINE MALEATE 10 MG PO TABS: 10.0000 mg | ORAL_TABLET | Freq: Four times a day (QID) | ORAL | 1 refills | Status: DC | PRN

## 2016-11-19 MED ORDER — ONDANSETRON HCL 8 MG PO TABS: 8.0000 mg | ORAL_TABLET | Freq: Two times a day (BID) | ORAL | 1 refills | Status: DC | PRN

## 2016-11-19 NOTE — Patient Instructions (Signed)
First of all, check with your insurance company to see if provider is in network   [facility]                                            [address]  [place], [place] [address] [phone]    Does not file for insurance--- call for appointment with Cathy  [facility]   (for wigs and compression sleeves / gloves/gauntlets )  [address] [place], [place] [address] [phone]  Will file some insurances --- call for appointment   [facility] (for mastectomy prosthetics and garments) [address] [place], [place] [address] [phone] Will file some insurances --- call for appointment  [facility]  [address]  [place], [place] [address] [phone] Lower extremity garments  [facility] [address] [place], [place]  [address] [phone]  [employer] Sales rep:  Matt Lawson:  [phone] [email or web address] Biocompression pumps   [employer]  Sales rep: Robert Rollins:  [phone] [email or web address] Entre and Flexitouch pumps    Other Resources: National Lymphedema Network:  www.lymphnet.org [email or web address] for patient articles and purchase a self manual lymph drainage DVD [email or web address] has informative articles.  
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
## 2016-11-20 ENCOUNTER — Encounter: Payer: Self-pay | Admitting: Adult Health

## 2016-11-20 ENCOUNTER — Other Ambulatory Visit (HOSPITAL_BASED_OUTPATIENT_CLINIC_OR_DEPARTMENT_OTHER): Payer: Medicare Other

## 2016-11-20 ENCOUNTER — Ambulatory Visit: Payer: Medicare Other

## 2016-11-20 ENCOUNTER — Ambulatory Visit (HOSPITAL_BASED_OUTPATIENT_CLINIC_OR_DEPARTMENT_OTHER): Payer: Medicare Other

## 2016-11-20 ENCOUNTER — Ambulatory Visit (HOSPITAL_BASED_OUTPATIENT_CLINIC_OR_DEPARTMENT_OTHER): Payer: Medicare Other | Admitting: Adult Health

## 2016-11-20 ENCOUNTER — Ambulatory Visit (HOSPITAL_COMMUNITY)
Admission: RE | Admit: 2016-11-20 | Discharge: 2016-11-20 | Disposition: A | Discharge status: Home / Self Care | Payer: Medicare Other | Source: Ambulatory Visit | Attending: Adult Health | Admitting: Adult Health

## 2016-11-20 VITALS — BP 144/56 | HR 78 | Temp 98.5°F | Resp 18 | Ht 63.0 in | Wt 157.9 lb

## 2016-11-20 DIAGNOSIS — C50411 Malignant neoplasm of upper-outer quadrant of right female breast: Secondary | ICD-10-CM

## 2016-11-20 DIAGNOSIS — G620 Drug-induced polyneuropathy: Secondary | ICD-10-CM | POA: Diagnosis not present

## 2016-11-20 DIAGNOSIS — R1012 Left upper quadrant pain: Secondary | ICD-10-CM | POA: Diagnosis not present

## 2016-11-20 DIAGNOSIS — Z170 Estrogen receptor positive status [ER+]: Secondary | ICD-10-CM | POA: Diagnosis not present

## 2016-11-20 DIAGNOSIS — R109 Unspecified abdominal pain: Secondary | ICD-10-CM

## 2016-11-20 DIAGNOSIS — Z5111 Encounter for antineoplastic chemotherapy: Secondary | ICD-10-CM

## 2016-11-20 DIAGNOSIS — Z95828 Presence of other vascular implants and grafts: Secondary | ICD-10-CM

## 2016-11-20 LAB — COMPREHENSIVE METABOLIC PANEL
ALBUMIN: 3.9 g/dL (ref 3.5–5.0)
ALK PHOS: 105 U/L (ref 40–150)
ALT: 11 U/L (ref 0–55)
ANION GAP: 12 meq/L — AB (ref 3–11)
AST: 11 U/L (ref 5–34)
BUN: 10.7 mg/dL (ref 7.0–26.0)
CALCIUM: 9.3 mg/dL (ref 8.4–10.4)
CHLORIDE: 105 meq/L (ref 98–109)
CO2: 23 mEq/L (ref 22–29)
Creatinine: 0.6 mg/dL (ref 0.6–1.1)
Glucose: 100 mg/dl (ref 70–140)
POTASSIUM: 3.8 meq/L (ref 3.5–5.1)
Sodium: 140 mEq/L (ref 136–145)
Total Bilirubin: <0.22 mg/dL (ref 0.20–1.20)
Total Protein: 7.2 g/dL (ref 6.4–8.3)

## 2016-11-20 LAB — CBC WITH DIFFERENTIAL/PLATELET
BASO%: 0.4 % (ref 0.0–2.0)
BASOS ABS: 0.1 10*3/uL (ref 0.0–0.1)
EOS ABS: 0.3 10*3/uL (ref 0.0–0.5)
EOS%: 2.4 % (ref 0.0–7.0)
HEMATOCRIT: 29.5 % — AB (ref 34.8–46.6)
HEMOGLOBIN: 9.7 g/dL — AB (ref 11.6–15.9)
LYMPH#: 0.7 10*3/uL — AB (ref 0.9–3.3)
LYMPH%: 5.4 % — ABNORMAL LOW (ref 14.0–49.7)
MCH: 29.4 pg (ref 25.1–34.0)
MCHC: 32.9 g/dL (ref 31.5–36.0)
MCV: 89.2 fL (ref 79.5–101.0)
MONO#: 2.3 10*3/uL — ABNORMAL HIGH (ref 0.1–0.9)
MONO%: 16.4 % — ABNORMAL HIGH (ref 0.0–14.0)
NEUT#: 10.4 10*3/uL — ABNORMAL HIGH (ref 1.5–6.5)
NEUT%: 75.4 % (ref 38.4–76.8)
Platelets: 212 10*3/uL (ref 145–400)
RBC: 3.31 10*6/uL — ABNORMAL LOW (ref 3.70–5.45)
RDW: 22.8 % — ABNORMAL HIGH (ref 11.2–14.5)
WBC: 13.8 10*3/uL — ABNORMAL HIGH (ref 3.9–10.3)

## 2016-11-20 MED ORDER — HEPARIN SOD (PORK) LOCK FLUSH 100 UNIT/ML IV SOLN
500.0000 [IU] | Freq: Once | INTRAVENOUS | Status: AC | PRN
  Administered 2016-11-20: 500 [IU] via INTRAVENOUS
  Filled 2016-11-20: qty 5

## 2016-11-20 MED ORDER — PACLITAXEL PROTEIN-BOUND CHEMO INJECTION 100 MG
80.0000 mg/m2 | Freq: Once | INTRAVENOUS | Status: AC
Start: 2016-11-20 — End: 2016-11-20
  Administered 2016-11-20: 150 mg via INTRAVENOUS
  Filled 2016-11-20: qty 30

## 2016-11-20 MED ORDER — SODIUM CHLORIDE 0.9% FLUSH
10.0000 mL | INTRAVENOUS | Status: DC | PRN
  Administered 2016-11-20: 10 mL via INTRAVENOUS
  Filled 2016-11-20: qty 10

## 2016-11-20 MED ORDER — SODIUM CHLORIDE 0.9 % IV SOLN: Freq: Once | INTRAVENOUS | Status: DC

## 2016-11-20 MED ORDER — SODIUM CHLORIDE 0.9 % IV SOLN
Freq: Once | INTRAVENOUS | Status: AC
  Administered 2016-11-20: 14:00:00 via INTRAVENOUS

## 2016-11-20 MED ORDER — SODIUM CHLORIDE 0.9% FLUSH
10.0000 mL | INTRAVENOUS | Status: DC | PRN
  Administered 2016-11-20: 10 mL
  Filled 2016-11-20: qty 10

## 2016-11-20 MED ORDER — ONDANSETRON HCL 4 MG/2ML IJ SOLN
INTRAMUSCULAR | Status: AC
  Filled 2016-11-20: qty 4

## 2016-11-20 MED ORDER — ONDANSETRON HCL 4 MG/2ML IJ SOLN
8.0000 mg | Freq: Once | INTRAMUSCULAR | Status: AC
  Administered 2016-11-20: 8 mg via INTRAVENOUS

## 2016-11-20 MED ORDER — HEPARIN SOD (PORK) LOCK FLUSH 100 UNIT/ML IV SOLN
500.0000 [IU] | Freq: Once | INTRAVENOUS | Status: AC | PRN
  Administered 2016-11-20: 500 [IU]
  Filled 2016-11-20: qty 5

## 2016-11-20 NOTE — Patient Instructions (Addendum)
[facility] Discharge Instructions for Patients Receiving Chemotherapy  Today you received the following chemotherapy agents Abraxane.   To help prevent nausea and vomiting after your treatment, we encourage you to take your nausea medication: Abraxane.   If you develop nausea and vomiting that is not controlled by your nausea medication, call the clinic.   BELOW ARE SYMPTOMS THAT SHOULD BE REPORTED IMMEDIATELY:  *FEVER GREATER THAN 100.5 F  *CHILLS WITH OR WITHOUT FEVER  NAUSEA AND VOMITING THAT IS NOT CONTROLLED WITH YOUR NAUSEA MEDICATION  *UNUSUAL SHORTNESS OF BREATH  *UNUSUAL BRUISING OR BLEEDING  TENDERNESS IN MOUTH AND THROAT WITH OR WITHOUT PRESENCE OF ULCERS  *URINARY PROBLEMS  *BOWEL PROBLEMS  UNUSUAL RASH Items with * indicate a potential emergency and should be followed up as soon as possible.  Feel free to call the clinic you have any questions or concerns. The clinic phone number is [phone].  Please show the [place] at check-in to the Emergency Department and triage nurse.   Nanoparticle Albumin-Bound Paclitaxel injection What is this medicine? NANOPARTICLE ALBUMIN-BOUND PACLITAXEL (Na no PAHR ti kuhl al BYOO muhn-bound PAK li TAX el) is a chemotherapy drug. It targets fast dividing cells, like cancer cells, and causes these cells to die. This medicine is used to treat advanced breast cancer and advanced lung cancer. This medicine may be used for other purposes; ask your health care provider or pharmacist if you have questions. COMMON BRAND NAME(S): Abraxane What should I tell my health care provider before I take this medicine? They need to know if you have any of these conditions: -kidney disease -liver disease -low blood counts, like low platelets, red blood cells, or white blood cells -recent or ongoing radiation therapy -an unusual or allergic reaction to paclitaxel, albumin, other chemotherapy, other medicines, foods,  dyes, or preservatives -pregnant or trying to get pregnant -breast-feeding How should I use this medicine? This drug is given as an infusion into a vein. It is administered in a hospital or clinic by a specially trained health care professional. Talk to your pediatrician regarding the use of this medicine in children. Special care may be needed. Overdosage: If you think you have taken too much of this medicine contact a poison control center or emergency room at once. NOTE: This medicine is only for you. Do not share this medicine with others. What if I miss a dose? It is important not to miss your dose. Call your doctor or health care professional if you are unable to keep an appointment. What may interact with this medicine? -cyclosporine -diazepam -ketoconazole -medicines to increase blood counts like filgrastim, pegfilgrastim, sargramostim -other chemotherapy drugs like cisplatin, doxorubicin, epirubicin, etoposide, teniposide, vincristine -quinidine -testosterone -vaccines -verapamil Talk to your doctor or health care professional before taking any of these medicines: -acetaminophen -aspirin -ibuprofen -ketoprofen -naproxen This list may not describe all possible interactions. Give your health care provider a list of all the medicines, herbs, non-prescription drugs, or dietary supplements you use. Also tell them if you smoke, drink alcohol, or use illegal drugs. Some items may interact with your medicine. What should I watch for while using this medicine? Your condition will be monitored carefully while you are receiving this medicine. You will need important blood work done while you are taking this medicine. This medicine can cause serious allergic reactions. If you experience allergic reactions like skin rash, itching or hives, swelling of the face, lips, or tongue, tell your doctor or health care professional  right away. In some cases, you may be given additional medicines to  help with side effects. Follow all directions for their use. This drug may make you feel generally unwell. This is not uncommon, as chemotherapy can affect healthy cells as well as cancer cells. Report any side effects. Continue your course of treatment even though you feel ill unless your doctor tells you to stop. Call your doctor or health care professional for advice if you get a fever, chills or sore throat, or other symptoms of a cold or flu. Do not treat yourself. This drug decreases your body's ability to fight infections. Try to avoid being around people who are sick. This medicine may increase your risk to bruise or bleed. Call your doctor or health care professional if you notice any unusual bleeding. Be careful brushing and flossing your teeth or using a toothpick because you may get an infection or bleed more easily. If you have any dental work done, tell your dentist you are receiving this medicine. Avoid taking products that contain aspirin, acetaminophen, ibuprofen, naproxen, or ketoprofen unless instructed by your doctor. These medicines may hide a fever. Do not become pregnant while taking this medicine. Women should inform their doctor if they wish to become pregnant or think they might be pregnant. There is a potential for serious side effects to an unborn child. Talk to your health care professional or pharmacist for more information. Do not breast-feed an infant while taking this medicine. Men are advised not to father a child while receiving this medicine. What side effects may I notice from receiving this medicine? Side effects that you should report to your doctor or health care professional as soon as possible: -allergic reactions like skin rash, itching or hives, swelling of the face, lips, or tongue -low blood counts - This drug may decrease the number of white blood cells, red blood cells and platelets. You may be at increased risk for infections and bleeding. -signs of  infection - fever or chills, cough, sore throat, pain or difficulty passing urine -signs of decreased platelets or bleeding - bruising, pinpoint red spots on the skin, black, tarry stools, nosebleeds -signs of decreased red blood cells - unusually weak or tired, fainting spells, lightheadedness -breathing problems -changes in vision -chest pain -high or low blood pressure -mouth sores -nausea and vomiting -pain, swelling, redness or irritation at the injection site -pain, tingling, numbness in the hands or feet -slow or irregular heartbeat -swelling of the ankle, feet, hands Side effects that usually do not require medical attention (report to your doctor or health care professional if they continue or are bothersome): -aches, pains -changes in the color of fingernails -diarrhea -hair loss -loss of appetite This list may not describe all possible side effects. Call your doctor for medical advice about side effects. You may report side effects to FDA at 1-800-FDA-1088. Where should I keep my medicine? This drug is given in a hospital or clinic and will not be stored at home. NOTE: This sheet is a summary. It may not cover all possible information. If you have questions about this medicine, talk to your doctor, pharmacist, or health care provider.  2018 Elsevier/Gold Standard (2015-05-22 10:05:20)

## 2016-11-20 NOTE — Progress Notes (Signed)
Patient Care Team: Nolene Ebbs, MD as PCP - General (Internal Medicine) Autumn Messing III, MD as Consulting Physician (General Surgery) Nicholas Lose, MD as Consulting Physician (Hematology and Oncology) Kyung Rudd, MD as Consulting Physician (Radiation Oncology) Gardenia Phlegm, NP as Nurse Practitioner (Hematology and Oncology)  DIAGNOSIS:  Encounter Diagnoses  Name Primary?  . Abdominal discomfort Yes  . Malignant neoplasm of upper-outer quadrant of right breast in female, estrogen receptor positive ([place])     SUMMARY OF ONCOLOGIC HISTORY:   Breast cancer of upper-outer quadrant of right female breast ([place])   07/24/2016 Initial Diagnosis    Palpable right breast mass for 2 months UOQ at 10:00: 4.7 x 4.1 x 3.7 cm with enlarged axillary lymph node biopsy benign, breast mass biopsy grade 3 IDC ER 30% week PR negative HER-2 negative Ki-67 90%      08/26/2016 Surgery    Right mastectomy: IDC grade 3, 7 cm, margins negative, 1/17 nodes positive, ER weakly +30%, PR negative, HER-2 negative, Ki-67 90%, T3 N1 stage IIIa      09/25/2016 -  Chemotherapy     Dose dense Adriamycin Cytoxan 4 followed y weelyAbraxane 12        CHIEF COMPLIANT: Abraxane  INTERVAL HISTORY: Casey Olson is a 45 year with above-mentioned history of right breast cancer currently on adjuvant chemotherapy.  She is doing well today.  She continues to have a persistent luq abdominal discomfort and fullness that has been present now for about 4-6 weeks.  It is not improved.  She already has some mild intermittent peripheral neuropathy, that is very faint in her fingertips and toes.    REVIEW OF SYSTEMS:   Review of Systems  Constitutional: Negative for chills, diaphoresis, fever, malaise/fatigue and weight loss.  HENT: Negative for hearing loss and tinnitus.   Eyes: Negative for blurred vision and double vision.  Respiratory: Negative for cough and shortness of breath.   Cardiovascular: Negative for  chest pain and palpitations.  Gastrointestinal: Negative for abdominal pain, blood in stool, constipation, diarrhea, heartburn, melena, nausea and vomiting.  Genitourinary: Negative for dysuria.  Skin: Negative for rash.  Neurological: Negative for dizziness, tingling, weakness and headaches.  Psychiatric/Behavioral: Negative for depression. The patient is not nervous/anxious.      I have reviewed the past medical history, past surgical history, social history and family history with the patient and they are unchanged from previous note.  ALLERGIES:  is allergic to percocet [oxycodone-acetaminophen].  MEDICATIONS:  Current Outpatient Prescriptions  Medication Sig Dispense Refill  . acetaminophen (TYLENOL) 325 MG tablet Take 650 mg by mouth every 6 (six) hours as needed for mild pain.    Marland Kitchen aspirin EC 81 MG tablet Take 81 mg by mouth daily.    . BD PEN NEEDLE NANO U/F 32G X 4 MM MISC USE TO INJECT INSULIN EVERY DAY (E11.65)  5  . Blood Glucose Monitoring Suppl (ONETOUCH VERIO FLEX SYSTEM) w/Device KIT USE TO TEST 3 TIMES DAILY DX E11.65  11  . Calcium Carbonate-Vitamin D (CALTRATE 600+D PO) Take 1 tablet by mouth 2 (two) times daily.    Marland Kitchen gabapentin (NEURONTIN) 100 MG capsule Take 1 capsule (100 mg total) by mouth 3 (three) times daily. 60 capsule 2  . Hyprom-Naphaz-Polysorb-Zn Sulf (CLEAR EYES COMPLETE OP) Apply 2 drops to eye 2 (two) times daily as needed (irritation).    Marland Kitchen LANTUS SOLOSTAR 100 UNIT/ML Solostar Pen INJECT 20 UNITS IN THE MORNING  5  . lidocaine-prilocaine (EMLA) cream Apply  to affected area once 30 g 3  . lidocaine-prilocaine (EMLA) cream Apply to affected area once 30 g 3  . loratadine (CLARITIN) 10 MG tablet Take 10 mg by mouth daily as needed for allergies or rhinitis.    Marland Kitchen LORazepam (ATIVAN) 0.5 MG tablet Take 1 tablet (0.5 mg total) by mouth every 6 (six) hours as needed (Nausea or vomiting). 30 tablet 0  . LORazepam (ATIVAN) 0.5 MG tablet Take 1 tablet (0.5 mg total)  by mouth every 6 (six) hours as needed (Nausea or vomiting). 30 tablet 0  . losartan (COZAAR) 50 MG tablet Take 50 mg by mouth daily.  5  . metFORMIN (GLUCOPHAGE) 1000 MG tablet Take 1,000 mg by mouth 2 (two) times daily.  5  . Multiple Vitamin (MULTIVITAMIN WITH MINERALS) TABS tablet Take 1 tablet by mouth daily.    . ondansetron (ZOFRAN) 8 MG tablet Take 1 tablet (8 mg total) by mouth 2 (two) times daily as needed. Start on the third day after chemotherapy. 30 tablet 1  . ondansetron (ZOFRAN) 8 MG tablet Take 1 tablet (8 mg total) by mouth 2 (two) times daily as needed (Nausea or vomiting). 30 tablet 1  . ONETOUCH DELICA LANCETS FINE MISC USE TO TEST 3 TIMES DAILY DX E11.65  11  . ONETOUCH VERIO test strip USE TO TEST 3 TIMES DAILY DX E11.65  11  . prochlorperazine (COMPAZINE) 10 MG tablet Take 1 tablet (10 mg total) by mouth every 6 (six) hours as needed (Nausea or vomiting). 30 tablet 1  . prochlorperazine (COMPAZINE) 10 MG tablet Take 1 tablet (10 mg total) by mouth every 6 (six) hours as needed (Nausea or vomiting). 30 tablet 1  . simvastatin (ZOCOR) 10 MG tablet Take 10 mg by mouth daily at 6 PM.   2   No current facility-administered medications for this visit.    Facility-Administered Medications Ordered in Other Visits  Medication Dose Route Frequency Provider Last Rate Last Dose  . heparin lock flush 100 unit/mL  500 Units Intracatheter Once PRN Nicholas Lose, MD      . sodium chloride flush (NS) 0.9 % injection 10 mL  10 mL Intravenous PRN Nicholas Lose, MD   10 mL at 09/25/16 1112  . sodium chloride flush (NS) 0.9 % injection 10 mL  10 mL Intracatheter PRN Nicholas Lose, MD        PHYSICAL EXAMINATION: ECOG PERFORMANCE STATUS: 1 - Symptomatic but completely ambulatory  Vitals:   11/20/16 1330  BP: (!) 144/56  Pulse: 78  Resp: 18  Temp: 98.5 F (36.9 C)   Filed Weights   11/20/16 1330  Weight: 157 lb 14.4 oz (71.6 kg)   GENERAL: Patient is a well appearing female in no  acute distress HEENT:  Sclerae anicteric.  PERRL.  Oropharynx clear and moist. No ulcerations or evidence of oropharyngeal candidiasis. Neck is supple.  NODES:  No cervical, supraclavicular, or axillary lymphadenopathy palpated.  BREAST EXAM:  Deferred. LUNGS:  Clear to auscultation bilaterally.  No wheezes or rhonchi. HEART:  Regular rate and rhythm. No murmur appreciated. ABDOMEN:  Soft, nontender.  Positive, normoactive bowel sounds. No organomegaly palpated. MSK:  No focal spinal tenderness to palpation.  EXTREMITIES:  No peripheral edema.   SKIN:  Clear with no obvious rashes or skin changes. No nail dyscrasia. NEURO:  Nonfocal. Well oriented.  Appropriate affect.    LABORATORY DATA:  I have reviewed the data as listed   Chemistry      Component  Value Date/Time   NA 140 11/20/2016 1209   K 3.8 11/20/2016 1209   CL 101 08/21/2016 1333   CO2 23 11/20/2016 1209   BUN 10.7 11/20/2016 1209   CREATININE 0.6 11/20/2016 1209      Component Value Date/Time   CALCIUM 9.3 11/20/2016 1209   ALKPHOS 105 11/20/2016 1209   AST 11 11/20/2016 1209   ALT 11 11/20/2016 1209   BILITOT <0.22 11/20/2016 1209       Lab Results  Component Value Date   WBC 13.8 (H) 11/20/2016   HGB 9.7 (L) 11/20/2016   HCT 29.5 (L) 11/20/2016   MCV 89.2 11/20/2016   PLT 212 11/20/2016   NEUTROABS 10.4 (H) 11/20/2016    ASSESSMENT & PLAN:  Breast cancer of upper-outer quadrant of right female breast ([place]) 07/24/2016: Palpable right breast mass for 2 months UOQ at 10:00: 4.7 x 4.1 x 3.7 cm with enlarged axillary lymph node biopsy benign, breast mass biopsy grade 3 IDC ER 30% week PR negative HER-2 negative Ki-67 90% CT CAP and Bone scan: No mets  Treatment plan based on multidisciplinary tumor board: 1. Right mastectomy01/24/2018: IDC grade 3, 7 cm, margins negative, 1/17 nodes positive, ER weakly +30%, PR negative, HER-2 negative, Ki-67 90%, T3 N1 stage IIIa 2. Adjuvant chemotherapy with Adriamycin  and Cytoxan dose dense 4 followed by Abraxane weekly 12 (Patient cannot take steroids due to diabetes and so we are not using Taxol) 3. Followed by adjuvant radiation therapy 4. Followed by adjuvant antiestrogen therapy -------------------------------------------------------------------------------------------------------------------------- Current treatment: Cycle 1 day 1 Abraxane chemotherapy Echocardiogram 09/16/2016: EF 60-65 %, next appointment in May, 2018  Chemotherapy toxicities: Chevi will proceed with Abraxane today.  Her CBC is stable and I reviewed this with her in detail.  We are awaiting CMET results.  Due to the persistent discomfort in her left upper quadrant, she will undergo abdominal xray today.  Will f/u with her about those results on Monday.  Morgann will return next week for labs, an appointment with Dr. Lindi Adie, and her second cycle of Abraxane.  I counseled her on possible side effects of treatment, and she knows to look out for worsening peripheral neuropathy.    A total of (30) minutes of face-to-face time was spent with this patient with greater than 50% of that time in counseling and care-coordination.    The patient has a good understanding of the overall plan. she agrees with it. she will call with any problems that may develop before the next visit here.   Scot Dock, NP 11/20/16

## 2016-11-23 ENCOUNTER — Other Ambulatory Visit: Payer: Medicare Other

## 2016-11-23 ENCOUNTER — Ambulatory Visit (HOSPITAL_BASED_OUTPATIENT_CLINIC_OR_DEPARTMENT_OTHER): Payer: Medicare Other | Admitting: Genetic Counselor

## 2016-11-23 ENCOUNTER — Telehealth: Payer: Self-pay

## 2016-11-23 ENCOUNTER — Encounter: Payer: Self-pay | Admitting: Genetic Counselor

## 2016-11-23 DIAGNOSIS — Z800 Family history of malignant neoplasm of digestive organs: Secondary | ICD-10-CM

## 2016-11-23 DIAGNOSIS — Z803 Family history of malignant neoplasm of breast: Secondary | ICD-10-CM

## 2016-11-23 DIAGNOSIS — Z806 Family history of leukemia: Secondary | ICD-10-CM | POA: Diagnosis not present

## 2016-11-23 DIAGNOSIS — Z315 Encounter for genetic counseling: Secondary | ICD-10-CM | POA: Diagnosis not present

## 2016-11-23 DIAGNOSIS — Z832 Family history of diseases of the blood and blood-forming organs and certain disorders involving the immune mechanism: Secondary | ICD-10-CM | POA: Diagnosis not present

## 2016-11-23 DIAGNOSIS — C50411 Malignant neoplasm of upper-outer quadrant of right female breast: Secondary | ICD-10-CM | POA: Diagnosis not present

## 2016-11-23 DIAGNOSIS — Z8042 Family history of malignant neoplasm of prostate: Secondary | ICD-10-CM | POA: Diagnosis not present

## 2016-11-23 DIAGNOSIS — Z170 Estrogen receptor positive status [ER+]: Principal | ICD-10-CM

## 2016-11-23 NOTE — Telephone Encounter (Signed)
-----   Message from Gardenia Phlegm, NP sent at 11/23/2016 10:19 AM EDT ----- Patient xray shows constipation, and a lot of stool in her colon and she needs to have many bowel movements to get some relief.  What is she taking as her bowel regimen?

## 2016-11-23 NOTE — Telephone Encounter (Signed)
Called with report per attached message from lindsey. Pt states she has had 3 or 4 watery BMs over the weekend and feeling a little better. Still having some smaller belly pains. She uses miralax occasionally and prunes. encouraged her to use miralax today and use colace as a softener. Then as she feels better to continue with colace and prunes prn.

## 2016-11-23 NOTE — Progress Notes (Signed)
REFERRING PROVIDER: Nolene Ebbs, MD [place], [place] [address]   Casey Lose, MD  PRIMARY PROVIDER:  Philis Fendt, MD  PRIMARY REASON FOR VISIT:  1. Malignant neoplasm of upper-outer quadrant of right breast in female, estrogen receptor positive ([place])   2. Family history of breast cancer   3. Family history of pancreatic cancer   4. Family history of prostate cancer      HISTORY OF PRESENT ILLNESS:   Casey Olson, a 66 y.o. female, was seen for a [place] cancer genetics consultation at the request of Dr. Lindi Adie due to a personal and family history of cancer.  Casey Olson presents to clinic today to discuss the possibility of a hereditary predisposition to cancer, genetic testing, and to further clarify her future cancer risks, as well as potential cancer risks for family members.   In December 2017, at the age of 59, Casey Olson was diagnosed with invasive ductal carcinoma of the right breast.  The tumor was ER+/PR+/Her2-. This was treated with right mastectomy and chemotherapy. Casey Olson reports having colonoscopies every 5 years due to her polyps.   CANCER HISTORY:    Breast cancer of upper-outer quadrant of right female breast ([place])   07/24/2016 Initial Diagnosis    Palpable right breast mass for 2 months UOQ at 10:00: 4.7 x 4.1 x 3.7 cm with enlarged axillary lymph node biopsy benign, breast mass biopsy grade 3 IDC ER 30% week PR negative HER-2 negative Ki-67 90%      08/26/2016 Surgery    Right mastectomy: IDC grade 3, 7 cm, margins negative, 1/17 nodes positive, ER weakly +30%, PR negative, HER-2 negative, Ki-67 90%, T3 N1 stage IIIa      09/25/2016 -  Chemotherapy     Dose dense Adriamycin Cytoxan 4 followed y weelyAbraxane 12         HORMONAL RISK FACTORS:  Menarche was at age 70-13.  First live birth at age 87.  OCP use for approximately 6-7 years.  Ovaries intact: no.  Hysterectomy: yes.  Menopausal status: postmenopausal.  HRT  use: 0 years. Colonoscopy: yes; had polyps. Mammogram within the last year: yes. Number of breast biopsies: 1. Up to date with pelvic exams:  yes. Any excessive radiation exposure in the past:  no  Past Medical History:  Diagnosis Date  . Bronchitis   . Cancer [facility])    right breast  . Diabetes mellitus without complication ([place])    type 2  . Family history of breast cancer   . Family history of pancreatic cancer   . Family history of prostate cancer   . GERD (gastroesophageal reflux disease)   . High cholesterol   . Hypertension     Past Surgical History:  Procedure Laterality Date  . ABDOMINAL HYSTERECTOMY    . COLONOSCOPY    . MASTECTOMY W/ SENTINEL NODE BIOPSY Right 08/26/2016   Procedure: RIGHT MASTECTOMY WITH SENTINEL LYMPH NODE BIOPSY;  Surgeon: Autumn Messing III, MD;  Location: [facility];  Service: General;  Laterality: Right;  . PORTACATH PLACEMENT Left 08/26/2016   Procedure: INSERTION PORT-A-CATH;  Surgeon: Autumn Messing III, MD;  Location: [facility];  Service: General;  Laterality: Left;  . TUBAL LIGATION    . WISDOM TOOTH EXTRACTION      Social History   Social History  . Marital status: Divorced    Spouse name: N/A  . Number of children: N/A  . Years of education: N/A   Social History Main Topics  .  Smoking status: Never Smoker  . Smokeless tobacco: Never Used  . Alcohol use No  . Drug use: No  . Sexual activity: Not Asked   Other Topics Concern  . None   Social History Narrative  . None     FAMILY HISTORY:  We obtained a detailed, 4-generation family history.  Significant diagnoses are listed below: Family History  Problem Relation Age of Onset  . Breast cancer Cousin     mat first cousin; dx in her late 83s to early 3s  . Stroke Mother   . Prostate cancer Father   . Pancreatic cancer Sister 15  . Sickle cell trait Brother   . Prostate cancer Maternal Uncle   . Breast cancer Paternal Aunt     dx over 52  . Leukemia Maternal Grandmother   . Heart  attack Paternal Grandmother   . Heart attack Paternal Grandfather   . Sickle cell trait Sister   . Sickle cell anemia Other   . Colon polyps Sister   . Breast cancer Cousin     paternal first cousin died in her 98s  . Breast cancer Cousin     pat first cousin died in her 75s  . Breast cancer Cousin     pat first cousin dx over 42    The patient has two daughters who are cancer free.  She has five sisters and three brothers.  One sister died of pancreatic cancer at age 54 and another sister died of an ectopic pregnancy at 69.  Both parents are deceased.  Her mother died of a stroke.  Her mother had three sisters and five brothers.  One brother has prostate cancer and one sister had a daughter with breast cancer dx in her late 20's-early 30's.  The patient's maternal grandmother had leukemia.    The patient's father had prostate cancer.  He had 11 siblings.  One sister had breast cancer, and two other sisters had daughters with breast cancer.  The patient has a total of three paternal cousins with breast cancer, two diagnosed under 12.  Casey Olson is unaware of previous family history of genetic testing for hereditary cancer risks. Patient's maternal ancestors are of [place] and [place] descent, and paternal ancestors are of [place] and [place] descent. There is no reported Ashkenazi Jewish ancestry. There is no known consanguinity.  GENETIC COUNSELING ASSESSMENT: Casey Olson is a 66 y.o. female with a personal and family history of breast cancer and a family history of prostate and pancreatic cancer which is somewhat suggestive of a hereditary cancer syndrome and predisposition to cancer. We, therefore, discussed and recommended the following at today's visit.   DISCUSSION: We discussed that about 5-10% of breast cancer is hereditary, with most cases due to BRCA mutations.  Other genes can also come into play when thinking about cancer.  We reviewed the  characteristics, features and inheritance patterns of hereditary cancer syndromes. We also discussed genetic testing, including the appropriate family members to test, the process of testing, insurance coverage and turn-around-time for results. We discussed the implications of a negative, positive and/or variant of uncertain significant result. We recommended Casey Olson pursue genetic testing for the Common Hereditary cancer gene panel. The Hereditary Gene Panel offered by [facility] includes sequencing and/or deletion duplication testing of the following 43 genes: APC, ATM, AXIN2, BARD1, BMPR1A, BRCA1, BRCA2, BRIP1, CDH1, CDKN2A (p14ARF), CDKN2A (p16INK4a), CHEK2, DICER1, EPCAM (Deletion/duplication testing only), GREM1 (promoter region deletion/duplication testing only), KIT, MEN1,  MLH1, MSH2, MSH6, MUTYH, NBN, NF1, PALB2, PDGFRA, PMS2, POLD1, POLE, PTEN, RAD50, RAD51C, RAD51D, SDHB, SDHC, SDHD, SMAD4, SMARCA4. STK11, TP53, TSC1, TSC2, and VHL.  The following gene was evaluated for sequence changes only: SDHA and HOXB13 c.251G>A variant only.   Based on Casey Olson's personal and family history of cancer, she meets medical criteria for genetic testing. Despite that she meets criteria, she may still have an out of pocket cost. We discussed that if her out of pocket cost for testing is over $100, the laboratory will call and confirm whether she wants to proceed with testing.  If the out of pocket cost of testing is less than $100 she will be billed by the genetic testing laboratory.   PLAN: After considering the risks, benefits, and limitations, Casey Olson  provided informed consent to pursue genetic testing and the blood sample was sent to [facility] for analysis of the Common Hereditary cancer panel. Results should be available within approximately 2-3 weeks' time, at which point they will be disclosed by telephone to Casey Olson, as will any additional recommendations warranted by these  results. Casey Olson will receive a summary of her genetic counseling visit and a copy of her results once available. This information will also be available in Epic. We encouraged Casey Olson to remain in contact with cancer genetics annually so that we can continuously update the family history and inform her of any changes in cancer genetics and testing that may be of benefit for her family. Casey Olson questions were answered to her satisfaction today. Our contact information was provided should additional questions or concerns arise.  Lastly, we encouraged Casey Olson to remain in contact with cancer genetics annually so that we can continuously update the family history and inform her of any changes in cancer genetics and testing that may be of benefit for this family.   Ms.  Olson questions were answered to her satisfaction today. Our contact information was provided should additional questions or concerns arise. Thank you for the referral and allowing [place] to share in the care of your patient.   Ricky Doan P. [place]ne [place], [place], [facility] Certified Genetic Counselor [email or web address] phone: [phone]  The patient was seen for a total of 45 minutes in face-to-face genetic counseling.  This patient was discussed with Drs. Magrinat, Lindi Adie and/or Burr Medico who agrees with the above.    _______________________________________________________________________ For Office Staff:  Number of people involved in session: 2 Was an Intern/ student involved with case: no

## 2016-11-24 ENCOUNTER — Telehealth: Payer: Self-pay

## 2016-11-24 NOTE — Telephone Encounter (Signed)
s/w pt re: chemo follow up. Pt drank 3-4 bottles water yesterday. She is not nauseated. She had a small BM today, encouraged her to use miralax again today. Explained impaction and watery stool coming around the impaction. Goal is to prevent this, and her abd xray showed large stool burden. (see telephone note 4/23). She states her abd is still a little uncomfortable. She will call if any questions or concerns.

## 2016-11-24 NOTE — Telephone Encounter (Signed)
-----   Message from Ronnette Juniper, RN sent at 11/20/2016  3:43 PM EDT ----- Regarding: Follow up 1st abraxane Abraxane: 1st time: completed A/C in the past.

## 2016-11-26 ENCOUNTER — Ambulatory Visit: Payer: Medicare Other

## 2016-11-26 DIAGNOSIS — Z483 Aftercare following surgery for neoplasm: Secondary | ICD-10-CM

## 2016-11-26 DIAGNOSIS — R609 Edema, unspecified: Secondary | ICD-10-CM

## 2016-11-26 DIAGNOSIS — M25511 Pain in right shoulder: Secondary | ICD-10-CM

## 2016-11-26 DIAGNOSIS — M25611 Stiffness of right shoulder, not elsewhere classified: Secondary | ICD-10-CM

## 2016-11-26 NOTE — Therapy (Signed)
[place], [place], [address] Phone: [phone]   Fax:  [phone]  Physical Therapy Treatment  Patient Details  Name: Casey Olson MRN: [id number] Date of Birth: 12-10-1950 Referring Provider: Dr. Marlou Starks  Encounter Date: 11/26/2016      PT End of Session - 11/26/16 1057    Visit Number 12   Number of Visits 13   Date for PT Re-Evaluation 12/10/16   PT Start Time 1019   PT Stop Time 1104   PT Time Calculation (min) 45 min   Activity Tolerance Patient tolerated treatment well   Behavior During Therapy [facility] for tasks assessed/performed      Past Medical History:  Diagnosis Date  . Bronchitis   . Cancer [facility])    right breast  . Diabetes mellitus without complication ([place])    type 2  . Family history of breast cancer   . Family history of pancreatic cancer   . Family history of prostate cancer   . GERD (gastroesophageal reflux disease)   . High cholesterol   . Hypertension     Past Surgical History:  Procedure Laterality Date  . ABDOMINAL HYSTERECTOMY    . COLONOSCOPY    . MASTECTOMY W/ SENTINEL NODE BIOPSY Right 08/26/2016   Procedure: RIGHT MASTECTOMY WITH SENTINEL LYMPH NODE BIOPSY;  Surgeon: Autumn Messing III, MD;  Location: [facility];  Service: General;  Laterality: Right;  . PORTACATH PLACEMENT Left 08/26/2016   Procedure: INSERTION PORT-A-CATH;  Surgeon: Autumn Messing III, MD;  Location: [facility];  Service: General;  Laterality: Left;  . TUBAL LIGATION    . WISDOM TOOTH EXTRACTION      There were no vitals filed for this visit.      Subjective Assessment - 11/26/16 1024    Subjective I've been doing the stretches and trying to rub th ecording at my elbow. My stomach pains were from being constipated (doctor took an xray) so I've been taking Mirulex and other laxatives. I haven't seen a big improvement with going to the bathroom but my stomach has been feeling a little better.    Pertinent  History Patient was diagnosed on 07/23/16 with right grade 3 invasive ductal carcinoma breast cancer.  It is located in the upper outer quadrant and measures 4.7 cm, is ER positive and PR negative, HEr2 negative and has a Ki67 of 90%.  Pt underwent an right mastectomy on 08/26/2017 they took out 17 lymph nodes with 1 positive. Past pressure includes DM, high blood pressure and high cholesterol  Pt will start chemo on Feb. 23, but hopes to not have radiation.    Patient Stated Goals to be able to move her arm like she used to    Currently in Pain? No/denies            [facility] PT Assessment - 11/26/16 0001      AROM   Right Shoulder Flexion 132 Degrees   Right Shoulder ABduction 119 Degrees                     [facility] Adult PT Treatment/Exercise - 11/26/16 0001      Shoulder Exercises: Pulleys   Flexion 2 minutes   Flexion Limitations VC to decrease Rt scapular compensation   ABduction 2 minutes     Manual Therapy   Manual therapy comments Fullness at right scapular area and right lateral trunk visible and palpable cording in Right axilla, upper arm,  antecubital fossa and  forearm  but seems to be less with tissue of arm palpably softer.    Manual Lymphatic Drainage (MLD) 5 diaphragmatic breaths, In supine, right short neck ( left upper quadrant avoided due to port placement) right inguinal nodes and establishment of axillo inguinal pathway, right interaxiallry nodes and establisment of interaxillary  anastamosis, right shoulder, upper arm, lower arm with myofascial stretch at areas of cording also in left sidelying for posterior interaxillary anastamosis and lateral trunk    Passive ROM In Supine to Rt shoulder in direction of flexion, abduction to pts tolerance. Passive prolonged stretching to areas of cording down into arm                    Short Term Clinic Goals - 10/29/16 1021      CC Short Term Goal  #1   Title Patient with verbalize an understanding of  lymphedema risk reduction precautions   Baseline 10/29/16- pt states she is familiar with these items   Time 3   Period Weeks   Status Achieved     CC Short Term Goal  #2   Title Patient will be independent in a basic  home exercise program   Status Achieved            Long Term Clinic Goals - 11/26/16 1106      CC Long Term Goal  #1   Title Patient will improve right shoulder flexion range of motion to 150 degrees to perform activities of daily living and household chores with greater ease   Baseline 75 degrees at eval; 93 degrees-10/08/16, 10/29/16- 126 degrees, 11/12/16- 125 degrees; 132 degrees-11/26/16   Status On-going     CC Long Term Goal  #2   Title Patient will improve right shoulder abduction to 120 degrees so that she can be independent in dressing    Baseline 73 degrees at eval; 92 degrees-10/08/16, 10/29/16- 105 degrees, 11/12/16- 115 degrees; 119 degrees-11/26/16   Status On-going     CC Long Term Goal  #3   Title Patient will decrease the DASH score to < 38   to demonstrate increased functional use of upper extremity   Status On-going            Plan - 11/26/16 1058    Clinical Impression Statement Pts cording did palpably improve during P/ROM and manual therapy today allowing for increased P/ROM by end of session. She reports noticing slightly less tenderness here as well. Her A/ROM has improved well since last measured, especially with flexion.    Rehab Potential Good   Clinical Impairments Affecting Rehab Potential 17 nodes removed.  will have ongoing chemotherapy    PT Frequency 1x / week   PT Duration 6 weeks   PT Treatment/Interventions ADLs/Self Care Home Management;Patient/family education;Orthotic Fit/Training;Taping;Manual techniques;Manual lymph drainage;Compression bandaging;Passive range of motion;Neuromuscular re-education;Therapeutic exercise;Therapeutic activities;Functional mobility training   PT Next Visit Plan Cont AA/A/P/ROM to right shoulder with  myofascial/MLD for cording, Continue protraction and shoulder strengthening, Discuss prophylacic compression sleeve.    Consulted and Agree with Plan of Care Patient      Patient will benefit from skilled therapeutic intervention in order to improve the following deficits and impairments:  Postural dysfunction, Decreased knowledge of precautions, Pain, Impaired UE functional use, Decreased range of motion, Decreased skin integrity, Increased edema, Decreased strength, Decreased knowledge of use of DME, Decreased activity tolerance  Visit Diagnosis: Aftercare following surgery for neoplasm  Edema, unspecified type  Acute pain of right shoulder  Stiffness of right shoulder, not elsewhere classified     Problem List Patient Active Problem List   Diagnosis Date Noted  . Family history of breast cancer   . Family history of pancreatic cancer   . Family history of prostate cancer   . Port catheter in place 09/25/2016  . Malignant neoplasm of upper-outer quadrant of right breast in female, estrogen receptor positive ([place]) 08/05/2016  . Breast cancer of upper-outer quadrant of right female breast ([place]) 07/31/2016    Otelia Limes, PTA 11/26/2016, 11:08 AM  [place], [place], [address] Phone: [phone]   Fax:  [phone]  Name: Casey Olson MRN: [id number] Date of Birth: 12-06-1950

## 2016-11-27 ENCOUNTER — Other Ambulatory Visit (HOSPITAL_BASED_OUTPATIENT_CLINIC_OR_DEPARTMENT_OTHER): Payer: Medicare Other

## 2016-11-27 ENCOUNTER — Encounter: Payer: Self-pay | Admitting: Hematology and Oncology

## 2016-11-27 ENCOUNTER — Ambulatory Visit (HOSPITAL_BASED_OUTPATIENT_CLINIC_OR_DEPARTMENT_OTHER): Payer: Medicare Other | Admitting: Hematology and Oncology

## 2016-11-27 ENCOUNTER — Encounter: Payer: Self-pay | Admitting: *Deleted

## 2016-11-27 ENCOUNTER — Ambulatory Visit (HOSPITAL_BASED_OUTPATIENT_CLINIC_OR_DEPARTMENT_OTHER): Payer: Medicare Other

## 2016-11-27 DIAGNOSIS — C50411 Malignant neoplasm of upper-outer quadrant of right female breast: Secondary | ICD-10-CM

## 2016-11-27 DIAGNOSIS — D649 Anemia, unspecified: Secondary | ICD-10-CM

## 2016-11-27 DIAGNOSIS — Z170 Estrogen receptor positive status [ER+]: Principal | ICD-10-CM

## 2016-11-27 DIAGNOSIS — R530 Neoplastic (malignant) related fatigue: Secondary | ICD-10-CM

## 2016-11-27 DIAGNOSIS — Z5111 Encounter for antineoplastic chemotherapy: Secondary | ICD-10-CM

## 2016-11-27 DIAGNOSIS — G620 Drug-induced polyneuropathy: Secondary | ICD-10-CM

## 2016-11-27 LAB — COMPREHENSIVE METABOLIC PANEL
ALBUMIN: 4.1 g/dL (ref 3.5–5.0)
ALK PHOS: 79 U/L (ref 40–150)
ALT: 10 U/L (ref 0–55)
ANION GAP: 9 meq/L (ref 3–11)
AST: 12 U/L (ref 5–34)
BUN: 11.2 mg/dL (ref 7.0–26.0)
CALCIUM: 9.7 mg/dL (ref 8.4–10.4)
CO2: 25 mEq/L (ref 22–29)
Chloride: 104 mEq/L (ref 98–109)
Creatinine: 0.7 mg/dL (ref 0.6–1.1)
EGFR: >90 mL/min/{1.73_m2} (ref >90)
Glucose: 97 mg/dl (ref 70–140)
POTASSIUM: 4.2 meq/L (ref 3.5–5.1)
SODIUM: 139 meq/L (ref 136–145)
Total Bilirubin: 0.26 mg/dL (ref 0.20–1.20)
Total Protein: 7.4 g/dL (ref 6.4–8.3)

## 2016-11-27 LAB — CBC WITH DIFFERENTIAL/PLATELET
BASO%: 0.8 % (ref 0.0–2.0)
Basophils Absolute: 0 10*3/uL (ref 0.0–0.1)
EOS%: 7.1 % — ABNORMAL HIGH (ref 0.0–7.0)
Eosinophils Absolute: 0.4 10*3/uL (ref 0.0–0.5)
HCT: 29.1 % — ABNORMAL LOW (ref 34.8–46.6)
HGB: 9.6 g/dL — ABNORMAL LOW (ref 11.6–15.9)
LYMPH%: 22.7 % (ref 14.0–49.7)
MCH: 29.2 pg (ref 25.1–34.0)
MCHC: 33 g/dL (ref 31.5–36.0)
MCV: 88.4 fL (ref 79.5–101.0)
MONO#: 0.7 10*3/uL (ref 0.1–0.9)
MONO%: 13.3 % (ref 0.0–14.0)
NEUT%: 56.1 % (ref 38.4–76.8)
NEUTROS ABS: 2.9 10*3/uL (ref 1.5–6.5)
Platelets: 364 10*3/uL (ref 145–400)
RBC: 3.29 10*6/uL — AB (ref 3.70–5.45)
RDW: 20.5 % — ABNORMAL HIGH (ref 11.2–14.5)
WBC: 5.2 10*3/uL (ref 3.9–10.3)
lymph#: 1.2 10*3/uL (ref 0.9–3.3)

## 2016-11-27 MED ORDER — SODIUM CHLORIDE 0.9 % IV SOLN
Freq: Once | INTRAVENOUS | Status: AC
Start: 2016-11-27 — End: 2016-11-27
  Administered 2016-11-27: 15:00:00 via INTRAVENOUS

## 2016-11-27 MED ORDER — ONDANSETRON HCL 4 MG/2ML IJ SOLN
INTRAMUSCULAR | Status: AC
  Filled 2016-11-27: qty 4

## 2016-11-27 MED ORDER — HEPARIN SOD (PORK) LOCK FLUSH 100 UNIT/ML IV SOLN
500.0000 [IU] | Freq: Once | INTRAVENOUS | Status: AC | PRN
  Administered 2016-11-27: 500 [IU]
  Filled 2016-11-27: qty 5

## 2016-11-27 MED ORDER — PACLITAXEL PROTEIN-BOUND CHEMO INJECTION 100 MG
65.0000 mg/m2 | Freq: Once | INTRAVENOUS | Status: AC
  Administered 2016-11-27: 125 mg via INTRAVENOUS
  Filled 2016-11-27: qty 25

## 2016-11-27 MED ORDER — GABAPENTIN 100 MG PO CAPS
100.0000 mg | ORAL_CAPSULE | Freq: Three times a day (TID) | ORAL | 3 refills | Status: DC
Start: 2016-11-27 — End: 2017-01-11

## 2016-11-27 MED ORDER — ONDANSETRON HCL 4 MG/2ML IJ SOLN
8.0000 mg | Freq: Once | INTRAMUSCULAR | Status: AC
  Administered 2016-11-27: 8 mg via INTRAVENOUS

## 2016-11-27 MED ORDER — SODIUM CHLORIDE 0.9% FLUSH
10.0000 mL | INTRAVENOUS | Status: DC | PRN
  Administered 2016-11-27: 10 mL
  Filled 2016-11-27: qty 10

## 2016-11-27 NOTE — Progress Notes (Signed)
Patient Care Team: Nolene Ebbs, MD as PCP - General (Internal Medicine) Autumn Messing III, MD as Consulting Physician (General Surgery) Nicholas Lose, MD as Consulting Physician (Hematology and Oncology) Kyung Rudd, MD as Consulting Physician (Radiation Oncology) Gardenia Phlegm, NP as Nurse Practitioner (Hematology and Oncology)  DIAGNOSIS:  Encounter Diagnosis  Name Primary?  . Malignant neoplasm of upper-outer quadrant of right breast in female, estrogen receptor positive ([place])     SUMMARY OF ONCOLOGIC HISTORY:   Breast cancer of upper-outer quadrant of right female breast ([place])   07/24/2016 Initial Diagnosis    Palpable right breast mass for 2 months UOQ at 10:00: 4.7 x 4.1 x 3.7 cm with enlarged axillary lymph node biopsy benign, breast mass biopsy grade 3 IDC ER 30% week PR negative HER-2 negative Ki-67 90%      08/26/2016 Surgery    Right mastectomy: IDC grade 3, 7 cm, margins negative, 1/17 nodes positive, ER weakly +30%, PR negative, HER-2 negative, Ki-67 90%, T3 N1 stage IIIa      09/25/2016 -  Chemotherapy     Dose dense Adriamycin Cytoxan 4 followed y weelyAbraxane 12       11/23/2016 Genetic Testing    Results pending.  Genes tested include: APC, ATM, AXIN2, BARD1, BMPR1A, BRCA1, BRCA2, BRIP1, CDH1, CDKN2A (p14ARF), CDKN2A (p16INK4a), CHEK2, DICER1, EPCAM (Deletion/duplication testing only), GREM1 (promoter region deletion/duplication testing only), KIT, MEN1, MLH1, MSH2, MSH6, MUTYH, NBN, NF1, PALB2, PDGFRA, PMS2, POLD1, POLE, PTEN, RAD50, RAD51C, RAD51D, SDHB, SDHC, SDHD, SMAD4, SMARCA4. STK11, TP53, TSC1, TSC2, and VHL.  The following gene was evaluated for sequence changes only: SDHA and HOXB13 c.251G>A variant only.       CHIEF COMPLIANT: Cycle 2 Abraxane  INTERVAL HISTORY: Casey Olson is a 66 year old above-mentioned history of right breast cancer currently on Abraxane. Today is cycle 2. 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 These appear to have slightly gotten worse. It is very difficult to get a clear history from her. She does not have any current tingling and numbness but whenever she puts her hand in a certain position she feels more numb and tingly. Denies any nausea vomiting. Otherwise tolerating chemotherapy fairly well.  REVIEW OF SYSTEMS:   Constitutional: Denies fevers, chills or abnormal weight loss Eyes: Denies blurriness of vision Ears, nose, mouth, throat, and face: Denies mucositis or sore throat Respiratory: Denies cough, dyspnea or wheezes Cardiovascular: Denies palpitation, chest discomfort Gastrointestinal:  Denies nausea, heartburn or change in bowel habits Skin: Denies abnormal skin rashes Lymphatics: Denies new lymphadenopathy or easy bruising Neurological: Chronic neuropathy in hands and feet Behavioral/Psych: Mood is stable, no new changes  Extremities: No lower extremity edema  All other systems were reviewed with the patient and are negative.  I have reviewed the past medical history, past surgical history, social history and family history with the patient and they are unchanged from previous note.  ALLERGIES:  is allergic to percocet [oxycodone-acetaminophen].  MEDICATIONS:  Current Outpatient Prescriptions  Medication Sig Dispense Refill  . acetaminophen (TYLENOL) 325 MG tablet Take 650 mg by mouth every 6 (six) hours as needed for mild pain.    Marland Kitchen aspirin EC 81 MG tablet Take 81 mg by mouth daily.    . BD PEN NEEDLE NANO U/F 32G X 4 MM MISC USE TO INJECT INSULIN EVERY DAY (E11.65)  5  . Blood Glucose Monitoring Suppl (ONETOUCH VERIO FLEX SYSTEM) w/Device KIT USE TO TEST 3 TIMES DAILY DX  E11.65  11  . Calcium Carbonate-Vitamin D (CALTRATE 600+D PO) Take 1 tablet by mouth 2 (two) times daily.    Marland Kitchen gabapentin (NEURONTIN) 100 MG capsule Take 1 capsule (100 mg total) by mouth 3 (three) times daily. 60 capsule 2  . Hyprom-Naphaz-Polysorb-Zn Sulf (CLEAR EYES COMPLETE OP)  Apply 2 drops to eye 2 (two) times daily as needed (irritation).    Marland Kitchen LANTUS SOLOSTAR 100 UNIT/ML Solostar Pen INJECT 20 UNITS IN THE MORNING  5  . lidocaine-prilocaine (EMLA) cream Apply to affected area once 30 g 3  . lidocaine-prilocaine (EMLA) cream Apply to affected area once 30 g 3  . loratadine (CLARITIN) 10 MG tablet Take 10 mg by mouth daily as needed for allergies or rhinitis.    Marland Kitchen LORazepam (ATIVAN) 0.5 MG tablet Take 1 tablet (0.5 mg total) by mouth every 6 (six) hours as needed (Nausea or vomiting). 30 tablet 0  . LORazepam (ATIVAN) 0.5 MG tablet Take 1 tablet (0.5 mg total) by mouth every 6 (six) hours as needed (Nausea or vomiting). 30 tablet 0  . losartan (COZAAR) 50 MG tablet Take 50 mg by mouth daily.  5  . metFORMIN (GLUCOPHAGE) 1000 MG tablet Take 1,000 mg by mouth 2 (two) times daily.  5  . Multiple Vitamin (MULTIVITAMIN WITH MINERALS) TABS tablet Take 1 tablet by mouth daily.    . ondansetron (ZOFRAN) 8 MG tablet Take 1 tablet (8 mg total) by mouth 2 (two) times daily as needed. Start on the third day after chemotherapy. 30 tablet 1  . ondansetron (ZOFRAN) 8 MG tablet Take 1 tablet (8 mg total) by mouth 2 (two) times daily as needed (Nausea or vomiting). 30 tablet 1  . ONETOUCH DELICA LANCETS FINE MISC USE TO TEST 3 TIMES DAILY DX E11.65  11  . ONETOUCH VERIO test strip USE TO TEST 3 TIMES DAILY DX E11.65  11  . prochlorperazine (COMPAZINE) 10 MG tablet Take 1 tablet (10 mg total) by mouth every 6 (six) hours as needed (Nausea or vomiting). 30 tablet 1  . prochlorperazine (COMPAZINE) 10 MG tablet Take 1 tablet (10 mg total) by mouth every 6 (six) hours as needed (Nausea or vomiting). 30 tablet 1  . simvastatin (ZOCOR) 10 MG tablet Take 10 mg by mouth daily at 6 PM.   2   No current facility-administered medications for this visit.    Facility-Administered Medications Ordered in Other Visits  Medication Dose Route Frequency Provider Last Rate Last Dose  . sodium chloride  flush (NS) 0.9 % injection 10 mL  10 mL Intravenous PRN Nicholas Lose, MD   10 mL at 09/25/16 1112    PHYSICAL EXAMINATION: ECOG PERFORMANCE STATUS: 1 - Symptomatic but completely ambulatory  Vitals:   11/27/16 1355  BP: (!) 120/53  Pulse: 93  Resp: 18  Temp: 98.3 F (36.8 C)   Filed Weights   11/27/16 1355  Weight: 155 lb 12.8 oz (70.7 kg)    GENERAL:alert, no distress and comfortable SKIN: skin color, texture, turgor are normal, no rashes or significant lesions EYES: normal, Conjunctiva are pink and non-injected, sclera clear OROPHARYNX:no exudate, no erythema and lips, buccal mucosa, and tongue normal  NECK: supple, thyroid normal size, non-tender, without nodularity LYMPH:  no palpable lymphadenopathy in the cervical, axillary or inguinal LUNGS: clear to auscultation and percussion with normal breathing effort HEART: regular rate & rhythm and no murmurs and no lower extremity edema ABDOMEN:abdomen soft, non-tender and normal bowel sounds MUSCULOSKELETAL:no cyanosis of digits and  no clubbing  NEURO: Neuropathy in hands and feet grade 1-2 EXTREMITIES: No lower extremity edema  LABORATORY DATA:  I have reviewed the data as listed   Chemistry      Component Value Date/Time   NA 140 11/20/2016 1209   K 3.8 11/20/2016 1209   CL 101 08/21/2016 1333   CO2 23 11/20/2016 1209   BUN 10.7 11/20/2016 1209   CREATININE 0.6 11/20/2016 1209      Component Value Date/Time   CALCIUM 9.3 11/20/2016 1209   ALKPHOS 105 11/20/2016 1209   AST 11 11/20/2016 1209   ALT 11 11/20/2016 1209   BILITOT <0.22 11/20/2016 1209       Lab Results  Component Value Date   WBC 5.2 11/27/2016   HGB 9.6 (L) 11/27/2016   HCT 29.1 (L) 11/27/2016   MCV 88.4 11/27/2016   PLT 364 11/27/2016   NEUTROABS 2.9 11/27/2016    ASSESSMENT & PLAN:  Breast cancer of upper-outer quadrant of right female breast ([place]) 07/24/2016: Palpable right breast mass for 2 months UOQ at 10:00: 4.7 x 4.1 x 3.7 cm  with enlarged axillary lymph node biopsy benign, breast mass biopsy grade 3 IDC ER 30% week PR negative HER-2 negative Ki-67 90% CT CAP and Bone scan: No mets  Treatment plan based on multidisciplinary tumor board: 1. Right mastectomy01/24/2018: IDC grade 3, 7 cm, margins negative, 1/17 nodes positive, ER weakly +30%, PR negative, HER-2 negative, Ki-67 90%, T3 N1 stage IIIa 2. Adjuvant chemotherapy with Adriamycin and Cytoxan dose dense 4 followed by Abraxane weekly 12 (Patient cannot state steroids due to diabetes and so we are not using Taxol) 3. Followed by adjuvant radiation therapy 4. Followed by adjuvant antiestrogen therapy -------------------------------------------------------------------------------------------------------------------------- Current treatment: Completed 4 cycles ofdose dense Adriamycin Cytoxan, today is cycle 2 Taxol Echocardiogram 09/16/2016: EF 60-65 %  Chemotherapy toxicities: 1. Fatigue due to chemotherapy 2. nausea grade 1 3. 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 Return to clinic weekly for Abraxane and every 2 weeks for follow-up with me  I spent 25 minutes talking to the patient of which more than half was spent in counseling and coordination of care.  No orders of the defined types were placed in this encounter.  The patient has a good understanding of the overall plan. she agrees with it. she will call with any problems that may develop before the next visit here.   Rulon Eisenmenger, MD 11/27/16

## 2016-11-27 NOTE — Assessment & Plan Note (Signed)
07/24/2016: Palpable right breast mass for 2 months UOQ at 10:00: 4.7 x 4.1 x 3.7 cm with enlarged axillary lymph node biopsy benign, breast mass biopsy grade 3 IDC ER 30% week PR negative HER-2 negative Ki-67 90% CT CAP and Bone scan: No mets  Treatment plan based on multidisciplinary tumor board: 1. Right mastectomy01/24/2018: IDC grade 3, 7 cm, margins negative, 1/17 nodes positive, ER weakly +30%, PR negative, HER-2 negative, Ki-67 90%, T3 N1 stage IIIa 2. Adjuvant chemotherapy with Adriamycin and Cytoxan dose dense 4 followed by Abraxane weekly 12 (Patient cannot state steroids due to diabetes and so we are not using Taxol) 3. Followed by adjuvant radiation therapy 4. Followed by adjuvant antiestrogen therapy -------------------------------------------------------------------------------------------------------------------------- Current treatment: Completed 4 cycles ofdose dense Adriamycin Cytoxan, today is cycle 2 Taxol Echocardiogram 09/16/2016: EF 60-65 %  Chemotherapy toxicities: 1. Fatigue due to chemotherapy 2. nausea grade 1 3. Anemia: Being monitored Return to clinic weekly for Taxol every 2 weeks for follow-up with me

## 2016-11-27 NOTE — Patient Instructions (Signed)
[facility] Discharge Instructions for Patients Receiving Chemotherapy  Today you received the following chemotherapy agents Abraxane.   To help prevent nausea and vomiting after your treatment, we encourage you to take your nausea medication: Abraxane.   If you develop nausea and vomiting that is not controlled by your nausea medication, call the clinic.   BELOW ARE SYMPTOMS THAT SHOULD BE REPORTED IMMEDIATELY:  *FEVER GREATER THAN 100.5 F  *CHILLS WITH OR WITHOUT FEVER  NAUSEA AND VOMITING THAT IS NOT CONTROLLED WITH YOUR NAUSEA MEDICATION  *UNUSUAL SHORTNESS OF BREATH  *UNUSUAL BRUISING OR BLEEDING  TENDERNESS IN MOUTH AND THROAT WITH OR WITHOUT PRESENCE OF ULCERS  *URINARY PROBLEMS  *BOWEL PROBLEMS  UNUSUAL RASH Items with * indicate a potential emergency and should be followed up as soon as possible.  Feel free to call the clinic you have any questions or concerns. The clinic phone number is [phone].  Please show the [facility] at check-in to the Emergency Department and triage nurse.   Nanoparticle Albumin-Bound Paclitaxel injection What is this medicine? NANOPARTICLE ALBUMIN-BOUND PACLITAXEL (Na no PAHR ti kuhl al BYOO muhn-bound PAK li TAX el) is a chemotherapy drug. It targets fast dividing cells, like cancer cells, and causes these cells to die. This medicine is used to treat advanced breast cancer and advanced lung cancer. This medicine may be used for other purposes; ask your health care provider or pharmacist if you have questions. COMMON BRAND NAME(S): Abraxane What should I tell my health care provider before I take this medicine? They need to know if you have any of these conditions: -kidney disease -liver disease -low blood counts, like low platelets, red blood cells, or white blood cells -recent or ongoing radiation therapy -an unusual or allergic reaction to paclitaxel, albumin, other chemotherapy, other medicines, foods,  dyes, or preservatives -pregnant or trying to get pregnant -breast-feeding How should I use this medicine? This drug is given as an infusion into a vein. It is administered in a hospital or clinic by a specially trained health care professional. Talk to your pediatrician regarding the use of this medicine in children. Special care may be needed. Overdosage: If you think you have taken too much of this medicine contact a poison control center or emergency room at once. NOTE: This medicine is only for you. Do not share this medicine with others. What if I miss a dose? It is important not to miss your dose. Call your doctor or health care professional if you are unable to keep an appointment. What may interact with this medicine? -cyclosporine -diazepam -ketoconazole -medicines to increase blood counts like filgrastim, pegfilgrastim, sargramostim -other chemotherapy drugs like cisplatin, doxorubicin, epirubicin, etoposide, teniposide, vincristine -quinidine -testosterone -vaccines -verapamil Talk to your doctor or health care professional before taking any of these medicines: -acetaminophen -aspirin -ibuprofen -ketoprofen -naproxen This list may not describe all possible interactions. Give your health care provider a list of all the medicines, herbs, non-prescription drugs, or dietary supplements you use. Also tell them if you smoke, drink alcohol, or use illegal drugs. Some items may interact with your medicine. What should I watch for while using this medicine? Your condition will be monitored carefully while you are receiving this medicine. You will need important blood work done while you are taking this medicine. This medicine can cause serious allergic reactions. If you experience allergic reactions like skin rash, itching or hives, swelling of the face, lips, or tongue, tell your doctor or health care professional  right away. In some cases, you may be given additional medicines to  help with side effects. Follow all directions for their use. This drug may make you feel generally unwell. This is not uncommon, as chemotherapy can affect healthy cells as well as cancer cells. Report any side effects. Continue your course of treatment even though you feel ill unless your doctor tells you to stop. Call your doctor or health care professional for advice if you get a fever, chills or sore throat, or other symptoms of a cold or flu. Do not treat yourself. This drug decreases your body's ability to fight infections. Try to avoid being around people who are sick. This medicine may increase your risk to bruise or bleed. Call your doctor or health care professional if you notice any unusual bleeding. Be careful brushing and flossing your teeth or using a toothpick because you may get an infection or bleed more easily. If you have any dental work done, tell your dentist you are receiving this medicine. Avoid taking products that contain aspirin, acetaminophen, ibuprofen, naproxen, or ketoprofen unless instructed by your doctor. These medicines may hide a fever. Do not become pregnant while taking this medicine. Women should inform their doctor if they wish to become pregnant or think they might be pregnant. There is a potential for serious side effects to an unborn child. Talk to your health care professional or pharmacist for more information. Do not breast-feed an infant while taking this medicine. Men are advised not to father a child while receiving this medicine. What side effects may I notice from receiving this medicine? Side effects that you should report to your doctor or health care professional as soon as possible: -allergic reactions like skin rash, itching or hives, swelling of the face, lips, or tongue -low blood counts - This drug may decrease the number of white blood cells, red blood cells and platelets. You may be at increased risk for infections and bleeding. -signs of  infection - fever or chills, cough, sore throat, pain or difficulty passing urine -signs of decreased platelets or bleeding - bruising, pinpoint red spots on the skin, black, tarry stools, nosebleeds -signs of decreased red blood cells - unusually weak or tired, fainting spells, lightheadedness -breathing problems -changes in vision -chest pain -high or low blood pressure -mouth sores -nausea and vomiting -pain, swelling, redness or irritation at the injection site -pain, tingling, numbness in the hands or feet -slow or irregular heartbeat -swelling of the ankle, feet, hands Side effects that usually do not require medical attention (report to your doctor or health care professional if they continue or are bothersome): -aches, pains -changes in the color of fingernails -diarrhea -hair loss -loss of appetite This list may not describe all possible side effects. Call your doctor for medical advice about side effects. You may report side effects to FDA at 1-800-FDA-1088. Where should I keep my medicine? This drug is given in a hospital or clinic and will not be stored at home. NOTE: This sheet is a summary. It may not cover all possible information. If you have questions about this medicine, talk to your doctor, pharmacist, or health care provider.  2018 [employer] Standard (2015-05-22 10:05:20)

## 2016-12-03 ENCOUNTER — Ambulatory Visit: Payer: Medicare Other | Attending: General Surgery | Admitting: Physical Therapy

## 2016-12-03 DIAGNOSIS — M25611 Stiffness of right shoulder, not elsewhere classified: Secondary | ICD-10-CM | POA: Diagnosis present

## 2016-12-03 DIAGNOSIS — M6281 Muscle weakness (generalized): Secondary | ICD-10-CM | POA: Diagnosis present

## 2016-12-03 DIAGNOSIS — M25511 Pain in right shoulder: Secondary | ICD-10-CM | POA: Insufficient documentation

## 2016-12-03 DIAGNOSIS — Z483 Aftercare following surgery for neoplasm: Secondary | ICD-10-CM | POA: Diagnosis not present

## 2016-12-03 DIAGNOSIS — R293 Abnormal posture: Secondary | ICD-10-CM

## 2016-12-03 DIAGNOSIS — R609 Edema, unspecified: Secondary | ICD-10-CM | POA: Diagnosis present

## 2016-12-03 NOTE — Therapy (Signed)
[place], [place], [address] Phone: [phone]   Fax:  [phone]  Physical Therapy Treatment  Patient Details  Name: Casey Olson MRN: [id number] Date of Birth: 05-22-1951 Referring Provider: Dr. Marlou Starks  Encounter Date: 12/03/2016      PT End of Session - 12/03/16 1408    Visit Number 13   Number of Visits 13   Date for PT Re-Evaluation 12/10/16   PT Start Time 1018   PT Stop Time 1100   PT Time Calculation (min) 42 min   Activity Tolerance Patient tolerated treatment well   Behavior During Therapy [facility] for tasks assessed/performed      Past Medical History:  Diagnosis Date  . Bronchitis   . Cancer [facility])    right breast  . Diabetes mellitus without complication ([place])    type 2  . Family history of breast cancer   . Family history of pancreatic cancer   . Family history of prostate cancer   . GERD (gastroesophageal reflux disease)   . High cholesterol   . Hypertension     Past Surgical History:  Procedure Laterality Date  . ABDOMINAL HYSTERECTOMY    . COLONOSCOPY    . MASTECTOMY W/ SENTINEL NODE BIOPSY Right 08/26/2016   Procedure: RIGHT MASTECTOMY WITH SENTINEL LYMPH NODE BIOPSY;  Surgeon: Autumn Messing III, MD;  Location: [place];  Service: General;  Laterality: Right;  . PORTACATH PLACEMENT Left 08/26/2016   Procedure: INSERTION PORT-A-CATH;  Surgeon: Autumn Messing III, MD;  Location: [place];  Service: General;  Laterality: Left;  . TUBAL LIGATION    . WISDOM TOOTH EXTRACTION      There were no vitals filed for this visit.      Subjective Assessment - 12/03/16 1032    Subjective "I drove myself today"  pt feels that she is getting better and trying to do her exercises . Still has some slight trouble using her arm like sheused to.  she can't stretch it out like she used to.  Pt has not gotten her compression sleeve.    Pertinent History Patient was diagnosed on 07/23/16 with right grade 3  invasive ductal carcinoma breast cancer.  It is located in the upper outer quadrant and measures 4.7 cm, is ER positive and PR negative, HEr2 negative and has a Ki67 of 90%.  Pt underwent an right mastectomy on 08/26/2017 they took out 17 lymph nodes with 1 positive. Past pressure includes DM, high blood pressure and high cholesterol  Pt will start chemo on Feb. 23, but hopes to not have radiation.    Patient Stated Goals to be able to move her arm like she used to    Currently in Pain? No/denies                         [facility] Adult PT Treatment/Exercise - 12/03/16 0001      Self-Care   Self-Care Other Self-Care Comments   Other Self-Care Comments  encouraged pt to go to get compression sleeve. Reissued addresses of DME and gave pt form to take for [employer]      Shoulder Exercises: Supine   Other Supine Exercises small circles with control with hand pointed to ceiling.   Other Supine Exercises elbow flexion and extension      Shoulder Exercises: Standing   External Rotation AROM;Both;10 reps   Flexion AROM;Both;10 reps   ABduction AROM;Both;10 reps  mirror for visual cues  Shoulder Exercises: ROM/Strengthening   Other ROM/Strengthening Exercises lying supine with arm out at 90 degrees supported on pillow with head rotated to left with wrist flexion and extension for neural stretch      Manual Therapy   Manual therapy comments cording at and above/below antecubital fossa present but  arm is palpably softer    Passive ROM In Supine to Rt shoulder in direction of flexion, abduction to pts tolerance. Passive prolonged stretching to areas of cording down into arm                    Short Term Clinic Goals - 12/03/16 1412      CC Short Term Goal  #1   Title Patient with verbalize an understanding of lymphedema risk reduction precautions   Baseline 10/29/16- pt states she is familiar with these items   Status Achieved     CC Short Term Goal  #2    Title Patient will be independent in a basic  home exercise program   Status Achieved           Breast Clinic Goals - 08/05/16 1135      Patient will be able to verbalize understanding of pertinent lymphedema risk reduction practices relevant to her diagnosis specifically related to skin care.   Time 1   Period Days   Status Achieved     Patient will be able to return demonstrate and/or verbalize understanding of the post-op home exercise program related to regaining shoulder range of motion.   Time 1   Period Days   Status Achieved     Patient will be able to verbalize understanding of the importance of attending the postoperative After Breast Cancer Class for further lymphedema risk reduction education and therapeutic exercise.   Time 1   Period Days   Status Achieved          Long Term Clinic Goals - 12/03/16 1412      CC Long Term Goal  #1   Title Patient will improve right shoulder flexion range of motion to 150 degrees to perform activities of daily living and household chores with greater ease   Baseline 75 degrees at eval; 93 degrees-10/08/16, 10/29/16- 126 degrees, 11/12/16- 125 degrees; 132 degrees-11/26/16   Time 6   Period Weeks   Status On-going     CC Long Term Goal  #2   Title Patient will improve right shoulder abduction to 120 degrees so that she can be independent in dressing    Baseline 73 degrees at eval; 92 degrees-10/08/16, 10/29/16- 105 degrees, 11/12/16- 115 degrees; 119 degrees-11/26/16   Time 6   Period Weeks   Status On-going     CC Long Term Goal  #3   Title Patient will decrease the DASH score to < 38   to demonstrate increased functional use of upper extremity   Baseline 54, 10/29/16- 43   Time 6   Period Weeks   Status On-going            Plan - 12/03/16 1409    Clinical Impression Statement Overall, pt is improving and she appears to be moving easier and have more energy today.  She reports that she is doing more at home and is now  able to drive herself.  Spent time encouraging pt to get her sleeve and work on home exercse program in preparation for discharge next visit.  Pt is agreeable    Rehab Potential Good   Clinical Impairments  Affecting Rehab Potential 17 nodes removed.  will have ongoing chemotherapy    PT Frequency 1x / week   PT Duration 6 weeks   PT Treatment/Interventions ADLs/Self Care Home Management;Patient/family education;Orthotic Fit/Training;Taping;Manual techniques;Manual lymph drainage;Compression bandaging;Passive range of motion;Neuromuscular re-education;Therapeutic exercise;Therapeutic activities;Functional mobility training   PT Next Visit Plan Assess all goals for discharge and home program/use of compression sleeve for continuation at home Discharge or renew as appropriate    Consulted and Agree with Plan of Care Patient      Patient will benefit from skilled therapeutic intervention in order to improve the following deficits and impairments:  Postural dysfunction, Decreased knowledge of precautions, Pain, Impaired UE functional use, Decreased range of motion, Decreased skin integrity, Increased edema, Decreased strength, Decreased knowledge of use of DME, Decreased activity tolerance  Visit Diagnosis: Aftercare following surgery for neoplasm  Edema, unspecified type  Acute pain of right shoulder  Stiffness of right shoulder, not elsewhere classified  Muscle weakness (generalized)  Abnormal posture     Problem List Patient Active Problem List   Diagnosis Date Noted  . Family history of breast cancer   . Family history of pancreatic cancer   . Family history of prostate cancer   . Port catheter in place 09/25/2016  . Malignant neoplasm of upper-outer quadrant of right breast in female, estrogen receptor positive ([place]) 08/05/2016  . Breast cancer of upper-outer quadrant of right female breast ([place]) 07/31/2016   Donato Heinz. Owens Shark PT  Norwood Levo 12/03/2016, 2:14 PM  [place], [place], [address] Phone: [phone]   Fax:  [phone]  Name: Casey Olson MRN: [id number] Date of Birth: 1951-02-27

## 2016-12-03 NOTE — Patient Instructions (Signed)
First of all, check with your insurance company to see if provider is in network   [facility]                                            [address]  [place], [place] [address] [phone]    Does not file for insurance--- call for appointment with Cathy  [facility]   (for wigs and compression sleeves / gloves/gauntlets )  [address] [place], [place] [address] [phone]  Will file some insurances --- call for appointment   [facility] (for mastectomy prosthetics and garments) [address] [place], [place] [address] [phone] Will file some insurances --- call for appointment  [facility]  [address]  [place], [place] [address] [phone] Lower extremity garments  [facility] [address] [place], [place]  [address] [phone]  [employer] Sales rep:  Matt Lawson:  [phone] [email or web address] Biocompression pumps   [facility]  Sales rep: Robert Rollins:  [phone] [email or web address] Entre and Flexitouch pumps    Other Resources: National Lymphedema Network:  www.lymphnet.org [email or web address] for patient articles and purchase a self manual lymph drainage DVD [email or web address] has informative articles.  

## 2016-12-04 ENCOUNTER — Other Ambulatory Visit: Payer: Self-pay | Admitting: *Deleted

## 2016-12-04 ENCOUNTER — Ambulatory Visit (HOSPITAL_BASED_OUTPATIENT_CLINIC_OR_DEPARTMENT_OTHER): Payer: Medicare Other

## 2016-12-04 ENCOUNTER — Encounter: Payer: Self-pay | Admitting: Adult Health

## 2016-12-04 ENCOUNTER — Other Ambulatory Visit (HOSPITAL_BASED_OUTPATIENT_CLINIC_OR_DEPARTMENT_OTHER): Payer: Medicare Other

## 2016-12-04 ENCOUNTER — Ambulatory Visit (HOSPITAL_BASED_OUTPATIENT_CLINIC_OR_DEPARTMENT_OTHER): Payer: Medicare Other | Admitting: Adult Health

## 2016-12-04 ENCOUNTER — Ambulatory Visit: Payer: Medicare Other

## 2016-12-04 VITALS — BP 125/58 | HR 81 | Temp 98.1°F | Resp 18 | Ht 63.0 in | Wt 159.3 lb

## 2016-12-04 DIAGNOSIS — C50411 Malignant neoplasm of upper-outer quadrant of right female breast: Secondary | ICD-10-CM

## 2016-12-04 DIAGNOSIS — Z5111 Encounter for antineoplastic chemotherapy: Secondary | ICD-10-CM

## 2016-12-04 DIAGNOSIS — Z170 Estrogen receptor positive status [ER+]: Principal | ICD-10-CM

## 2016-12-04 DIAGNOSIS — R530 Neoplastic (malignant) related fatigue: Secondary | ICD-10-CM | POA: Diagnosis not present

## 2016-12-04 DIAGNOSIS — D649 Anemia, unspecified: Secondary | ICD-10-CM | POA: Diagnosis not present

## 2016-12-04 DIAGNOSIS — R110 Nausea: Secondary | ICD-10-CM

## 2016-12-04 DIAGNOSIS — G620 Drug-induced polyneuropathy: Secondary | ICD-10-CM

## 2016-12-04 LAB — COMPREHENSIVE METABOLIC PANEL
ALBUMIN: 3.8 g/dL (ref 3.5–5.0)
ALK PHOS: 71 U/L (ref 40–150)
ALT: 13 U/L (ref 0–55)
AST: 11 U/L (ref 5–34)
Anion Gap: 10 mEq/L (ref 3–11)
BILIRUBIN TOTAL: 0.22 mg/dL (ref 0.20–1.20)
BUN: 12.2 mg/dL (ref 7.0–26.0)
CO2: 25 meq/L (ref 22–29)
CREATININE: 0.6 mg/dL (ref 0.6–1.1)
Calcium: 9.2 mg/dL (ref 8.4–10.4)
Chloride: 105 mEq/L (ref 98–109)
EGFR: >90 mL/min/{1.73_m2} (ref >90)
GLUCOSE: 161 mg/dL — AB (ref 70–140)
Potassium: 4 mEq/L (ref 3.5–5.1)
SODIUM: 140 meq/L (ref 136–145)
TOTAL PROTEIN: 7 g/dL (ref 6.4–8.3)

## 2016-12-04 LAB — CBC WITH DIFFERENTIAL/PLATELET
BASO%: 1.2 % (ref 0.0–2.0)
BASOS ABS: 0 10*3/uL (ref 0.0–0.1)
EOS%: 12.3 % — AB (ref 0.0–7.0)
Eosinophils Absolute: 0.5 10*3/uL (ref 0.0–0.5)
HCT: 29 % — ABNORMAL LOW (ref 34.8–46.6)
HEMOGLOBIN: 9.5 g/dL — AB (ref 11.6–15.9)
LYMPH%: 14.1 % (ref 14.0–49.7)
MCH: 29.7 pg (ref 25.1–34.0)
MCHC: 32.8 g/dL (ref 31.5–36.0)
MCV: 90.3 fL (ref 79.5–101.0)
MONO#: 0.9 10*3/uL (ref 0.1–0.9)
MONO%: 25.5 % — AB (ref 0.0–14.0)
NEUT#: 1.7 10*3/uL (ref 1.5–6.5)
NEUT%: 46.9 % (ref 38.4–76.8)
Platelets: 288 10*3/uL (ref 145–400)
RBC: 3.22 10*6/uL — AB (ref 3.70–5.45)
RDW: 20.9 % — AB (ref 11.2–14.5)
WBC: 3.7 10*3/uL — ABNORMAL LOW (ref 3.9–10.3)
lymph#: 0.5 10*3/uL — ABNORMAL LOW (ref 0.9–3.3)

## 2016-12-04 MED ORDER — SODIUM CHLORIDE 0.9 % IJ SOLN
10.0000 mL | Freq: Once | INTRAMUSCULAR | Status: AC
  Administered 2016-12-04: 10 mL
  Filled 2016-12-04: qty 10

## 2016-12-04 MED ORDER — ONDANSETRON HCL 4 MG/2ML IJ SOLN
8.0000 mg | Freq: Once | INTRAMUSCULAR | Status: AC
  Administered 2016-12-04: 8 mg via INTRAVENOUS

## 2016-12-04 MED ORDER — ONDANSETRON HCL 4 MG/2ML IJ SOLN
INTRAMUSCULAR | Status: AC
  Filled 2016-12-04: qty 4

## 2016-12-04 MED ORDER — SODIUM CHLORIDE 0.9 % IV SOLN
Freq: Once | INTRAVENOUS | Status: AC
Start: 2016-12-04 — End: 2016-12-04
  Administered 2016-12-04: 14:00:00 via INTRAVENOUS

## 2016-12-04 MED ORDER — PACLITAXEL PROTEIN-BOUND CHEMO INJECTION 100 MG
65.0000 mg/m2 | Freq: Once | INTRAVENOUS | Status: AC
  Administered 2016-12-04: 125 mg via INTRAVENOUS
  Filled 2016-12-04: qty 25

## 2016-12-04 MED ORDER — HEPARIN SOD (PORK) LOCK FLUSH 100 UNIT/ML IV SOLN
500.0000 [IU] | Freq: Once | INTRAVENOUS | Status: AC | PRN
  Administered 2016-12-04: 500 [IU]
  Filled 2016-12-04: qty 5

## 2016-12-04 MED ORDER — SODIUM CHLORIDE 0.9% FLUSH
10.0000 mL | INTRAVENOUS | Status: DC | PRN
  Administered 2016-12-04: 10 mL
  Filled 2016-12-04: qty 10

## 2016-12-04 NOTE — Patient Instructions (Signed)
[facility] Discharge Instructions for Patients Receiving Chemotherapy  Today you received the following chemotherapy agents: Abraxane   To help prevent nausea and vomiting after your treatment, we encourage you to take your nausea medication as directed.    If you develop nausea and vomiting that is not controlled by your nausea medication, call the clinic.   BELOW ARE SYMPTOMS THAT SHOULD BE REPORTED IMMEDIATELY:  *FEVER GREATER THAN 100.5 F  *CHILLS WITH OR WITHOUT FEVER  NAUSEA AND VOMITING THAT IS NOT CONTROLLED WITH YOUR NAUSEA MEDICATION  *UNUSUAL SHORTNESS OF BREATH  *UNUSUAL BRUISING OR BLEEDING  TENDERNESS IN MOUTH AND THROAT WITH OR WITHOUT PRESENCE OF ULCERS  *URINARY PROBLEMS  *BOWEL PROBLEMS  UNUSUAL RASH Items with * indicate a potential emergency and should be followed up as soon as possible.  Feel free to call the clinic you have any questions or concerns. The clinic phone number is [phone].  Please show the CHEMO ALERT CARD at check-in to the Emergency Department and triage nurse.   

## 2016-12-04 NOTE — Assessment & Plan Note (Signed)
07/24/2016: Palpable right breast mass for 2 months UOQ at 10:00: 4.7 x 4.1 x 3.7 cm with enlarged axillary lymph node biopsy benign, breast mass biopsy grade 3 IDC ER 30% week PR negative HER-2 negative Ki-67 90% CT CAP and Bone scan: No mets  Treatment plan based on multidisciplinary tumor board: 1. Right mastectomy01/24/2018: IDC grade 3, 7 cm, margins negative, 1/17 nodes positive, ER weakly +30%, PR negative, HER-2 negative, Ki-67 90%, T3 N1 stage IIIa 2. Adjuvant chemotherapy with Adriamycin and Cytoxan dose dense 4 followed by Abraxane weekly 12 (Patient cannot state steroids due to diabetes and so we are not using Taxol) 3. Followed by adjuvant radiation therapy 4. Followed by adjuvant antiestrogen therapy -------------------------------------------------------------------------------------------------------------------------- Current treatment: Completed 4 cycles ofdose dense Adriamycin Cytoxan, today is cycle 2 Taxol Echocardiogram 09/16/2016: EF 60-65 %  Chemotherapy toxicities: 1. Fatigue due to chemotherapy 2. nausea grade 1 3. Anemia: Being monitored 4. 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

## 2016-12-04 NOTE — Progress Notes (Signed)
[facility] Follow up:    Casey Fendt, MD [place] [address]   DIAGNOSIS: Cancer Staging Malignant neoplasm of upper-outer quadrant of right breast in female, estrogen receptor positive ([place]) Staging form: Breast, AJCC 8th Edition - Clinical: cT2, cN0, cM0, ER: Positive, PR: Negative, HER2: Negative - Unsigned   SUMMARY OF ONCOLOGIC HISTORY:   Breast cancer of upper-outer quadrant of right female breast ([place])   07/24/2016 Initial Diagnosis    Palpable right breast mass for 2 months UOQ at 10:00: 4.7 x 4.1 x 3.7 cm with enlarged axillary lymph node biopsy benign, breast mass biopsy grade 3 IDC ER 30% week PR negative HER-2 negative Ki-67 90%      08/26/2016 Surgery    Right mastectomy: IDC grade 3, 7 cm, margins negative, 1/17 nodes positive, ER weakly +30%, PR negative, HER-2 negative, Ki-67 90%, T3 N1 stage IIIa      09/25/2016 -  Chemotherapy     Dose dense Adriamycin Cytoxan 4 followed y weelyAbraxane 12       11/23/2016 Genetic Testing    Results pending.  Genes tested include: APC, ATM, AXIN2, BARD1, BMPR1A, BRCA1, BRCA2, BRIP1, CDH1, CDKN2A (p14ARF), CDKN2A (p16INK4a), CHEK2, DICER1, EPCAM (Deletion/duplication testing only), GREM1 (promoter region deletion/duplication testing only), KIT, MEN1, MLH1, MSH2, MSH6, MUTYH, NBN, NF1, PALB2, PDGFRA, PMS2, POLD1, POLE, PTEN, RAD50, RAD51C, RAD51D, SDHB, SDHC, SDHD, SMAD4, SMARCA4. STK11, TP53, TSC1, TSC2, and VHL.  The following gene was evaluated for sequence changes only: SDHA and HOXB13 c.251G>A variant only.       CURRENT THERAPY: Adjuvant Abraxane week three  INTERVAL HISTORY: Casey Olson 66 y.o. female returns for follow up prior to receiving Abraxane today.  She contineus to have peripheral neuropathy. This is the same from last week.  She says it is intermittent and is not interfering with any motor activities such as buttoning, writing, picking up small items.  She denies  any fevers.  She does have mild intermittent nausea that is relieved with Compazine.     Patient Active Problem List   Diagnosis Date Noted  . Family history of breast cancer   . Family history of pancreatic cancer   . Family history of prostate cancer   . Port catheter in place 09/25/2016  . Malignant neoplasm of upper-outer quadrant of right breast in female, estrogen receptor positive (Casey Olson) 08/05/2016  . Breast cancer of upper-outer quadrant of right female breast (Casey Olson) 07/31/2016    is allergic to percocet [oxycodone-acetaminophen].  MEDICAL HISTORY: Past Medical History:  Diagnosis Date  . Bronchitis   . Cancer [facility]    right breast  . Diabetes mellitus without complication ([place])    type 2  . Family history of breast cancer   . Family history of pancreatic cancer   . Family history of prostate cancer   . GERD (gastroesophageal reflux disease)   . High cholesterol   . Hypertension     SURGICAL HISTORY: Past Surgical History:  Procedure Laterality Date  . ABDOMINAL HYSTERECTOMY    . COLONOSCOPY    . MASTECTOMY W/ SENTINEL NODE BIOPSY Right 08/26/2016   Procedure: RIGHT MASTECTOMY WITH SENTINEL LYMPH NODE BIOPSY;  Surgeon: Autumn Messing III, MD;  Location: [place];  Service: General;  Laterality: Right;  . PORTACATH PLACEMENT Left 08/26/2016   Procedure: INSERTION PORT-A-CATH;  Surgeon: Autumn Messing III, MD;  Location: [place];  Service: General;  Laterality: Left;  . TUBAL LIGATION    . WISDOM TOOTH  EXTRACTION      SOCIAL HISTORY: Social History   Social History  . Marital status: Divorced    Spouse name: N/A  . Number of children: N/A  . Years of education: N/A   Occupational History  . Not on file.   Social History Main Topics  . Smoking status: Never Smoker  . Smokeless tobacco: Never Used  . Alcohol use No  . Drug use: No  . Sexual activity: Not on file   Other Topics Concern  . Not on file   Social History Narrative  . No narrative on file    FAMILY  HISTORY: Family History  Problem Relation Age of Onset  . Breast cancer Cousin     mat first cousin; dx in her late 42s to early 34s  . Stroke Mother   . Prostate cancer Father   . Pancreatic cancer Sister 43  . Sickle cell trait Brother   . Prostate cancer Maternal Uncle   . Breast cancer Paternal Aunt     dx over 31  . Leukemia Maternal Grandmother   . Heart attack Paternal Grandmother   . Heart attack Paternal Grandfather   . Sickle cell trait Sister   . Sickle cell anemia Other   . Colon polyps Sister   . Breast cancer Cousin     paternal first cousin died in her 21s  . Breast cancer Cousin     pat first cousin died in her 104s  . Breast cancer Cousin     pat first cousin dx over 51    Review of Systems  Constitutional: Positive for fatigue. Negative for appetite change, chills, fever and unexpected weight change.  HENT:   Negative for hearing loss and lump/mass.   Eyes: Negative for eye problems and icterus.  Cardiovascular: Negative for chest pain, leg swelling and palpitations.  Gastrointestinal: Negative for abdominal distention, abdominal pain, blood in stool, constipation and diarrhea.  Endocrine: Negative for hot flashes.  Genitourinary: Negative for difficulty urinating and pelvic pain.   Skin: Negative for itching and rash.  Neurological: Positive for numbness. Negative for dizziness and extremity weakness.  Hematological: Negative for adenopathy. Does not bruise/bleed easily.  Psychiatric/Behavioral: Negative for depression. The patient is not nervous/anxious.       PHYSICAL EXAMINATION  ECOG PERFORMANCE STATUS: 1 - Symptomatic but completely ambulatory  Vitals:   12/04/16 1210  BP: (!) 125/58  Pulse: 81  Resp: 18  Temp: 98.1 F (36.7 C)    Physical Exam  Constitutional: She is oriented to person, place, and time and well-developed, well-nourished, and in no distress.  HENT:  Head: Normocephalic and atraumatic.  Mouth/Throat: No oropharyngeal  exudate.  Eyes: Pupils are equal, round, and reactive to light. No scleral icterus.  Neck: Neck supple.  Cardiovascular: Normal rate, regular rhythm, normal heart sounds and intact distal pulses.   Pulmonary/Chest: Effort normal and breath sounds normal.  Abdominal: Soft. Bowel sounds are normal. She exhibits no distension. There is no tenderness.  Musculoskeletal: She exhibits no edema.  Lymphadenopathy:    She has no cervical adenopathy.  Neurological: She is alert and oriented to person, place, and time.  Skin: Skin is warm and dry.  Psychiatric: Mood and affect normal.    LABORATORY DATA:  CBC    Component Value Date/Time   WBC 3.7 (L) 12/04/2016 1127   WBC 9.0 08/21/2016 1333   RBC 3.22 (L) 12/04/2016 1127   RBC 4.56 08/21/2016 1333   HGB 9.5 (L) 12/04/2016 1127  HCT 29.0 (L) 12/04/2016 1127   PLT 288 12/04/2016 1127   MCV 90.3 12/04/2016 1127   MCH 29.7 12/04/2016 1127   MCH 27.4 08/21/2016 1333   MCHC 32.8 12/04/2016 1127   MCHC 32.8 08/21/2016 1333   RDW 20.9 (H) 12/04/2016 1127   LYMPHSABS 0.5 (L) 12/04/2016 1127   MONOABS 0.9 12/04/2016 1127   EOSABS 0.5 12/04/2016 1127   BASOSABS 0.0 12/04/2016 1127    CMP     Component Value Date/Time   NA 140 12/04/2016 1127   K 4.0 12/04/2016 1127   CL 101 08/21/2016 1333   CO2 25 12/04/2016 1127   GLUCOSE 161 (H) 12/04/2016 1127   BUN 12.2 12/04/2016 1127   CREATININE 0.6 12/04/2016 1127   CALCIUM 9.2 12/04/2016 1127   PROT 7.0 12/04/2016 1127   ALBUMIN 3.8 12/04/2016 1127   AST 11 12/04/2016 1127   ALT 13 12/04/2016 1127   ALKPHOS 71 12/04/2016 1127   BILITOT 0.22 12/04/2016 1127   GFRNONAA >60 08/21/2016 1333   GFRAA >60 08/21/2016 1333       ASSESSMENT and PLAN:   Malignant neoplasm of upper-outer quadrant of right breast in female, estrogen receptor positive ([place]) 07/24/2016: Palpable right breast mass for 2 months UOQ at 10:00: 4.7 x 4.1 x 3.7 cm with enlarged axillary lymph node biopsy benign,  breast mass biopsy grade 3 IDC ER 30% week PR negative HER-2 negative Ki-67 90% CT CAP and Bone scan: No mets  Treatment plan based on multidisciplinary tumor board: 1. Right mastectomy01/24/2018: IDC grade 3, 7 cm, margins negative, 1/17 nodes positive, ER weakly +30%, PR negative, HER-2 negative, Ki-67 90%, T3 N1 stage IIIa 2. Adjuvant chemotherapy with Adriamycin and Cytoxan dose dense 4 followed by Abraxane weekly 12 (Patient cannot state steroids due to diabetes and so we are not using Taxol) 3. Followed by adjuvant radiation therapy 4. Followed by adjuvant antiestrogen therapy -------------------------------------------------------------------------------------------------------------------------- Current treatment: Completed 4 cycles ofdose dense Adriamycin Cytoxan, today is cycle 2 Taxol Echocardiogram 09/16/2016: EF 60-65 %  Chemotherapy toxicities: 1. Fatigue due to chemotherapy 2. nausea grade 1 3. Anemia: Being monitored 4. 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  She will proceed with Abraxane today.  Her peripheral neuropathy is stable today.  I reviewed her labs with her today.  Her WBC has trended downward, though remaining normal.  I told her that her wbc may possibly continue to decrease and she may have to skip chemotherapy if that happens.  She verbalized understanding of this.  We reviewed peripheral neuropathy in detail today and signs/symptoms to look for.     All questions were answered. The patient knows to call the clinic with any problems, questions or concerns. We can certainly see the patient much sooner if necessary.  A total of (30) minutes of face-to-face time was spent with this patient with greater than 50% of that time in counseling and care-coordination.   Scot Dock, NP 12/04/2016

## 2016-12-04 NOTE — Assessment & Plan Note (Signed)
Casey Olson is doing well today.  She will proceed with Abraxane today.  Her peripheral neuropathy is stable today.  I reviewed her labs with her today.  Her WBC has trended downward, though remaining normal.  I told her that her wbc may possibly continue to decrease and she may have to skip chemotherapy if that happens.  She verbalized understanding of this.  We reviewed peripheral neuropathy in detail today and signs/symptoms to look for.

## 2016-12-10 ENCOUNTER — Encounter: Payer: Self-pay | Admitting: Physical Therapy

## 2016-12-10 ENCOUNTER — Ambulatory Visit: Payer: Medicare Other | Admitting: Physical Therapy

## 2016-12-10 DIAGNOSIS — M25511 Pain in right shoulder: Secondary | ICD-10-CM

## 2016-12-10 DIAGNOSIS — Z483 Aftercare following surgery for neoplasm: Secondary | ICD-10-CM | POA: Diagnosis not present

## 2016-12-10 DIAGNOSIS — M25611 Stiffness of right shoulder, not elsewhere classified: Secondary | ICD-10-CM

## 2016-12-10 NOTE — Therapy (Signed)
[place], [place], [address] Phone: [phone]   Fax:  [phone]  Physical Therapy Treatment  Patient Details  Name: Casey Olson MRN: [id number] Date of Birth: 1951/05/30 Referring Provider: Dr. Marlou Starks  Encounter Date: 12/10/2016      PT End of Session - 12/10/16 1233    Visit Number 14   Number of Visits 13   Date for PT Re-Evaluation 12/10/16   PT Start Time 1022   PT Stop Time 1103   PT Time Calculation (min) 41 min   Activity Tolerance Patient tolerated treatment well   Behavior During Therapy [facility] for tasks assessed/performed      Past Medical History:  Diagnosis Date  . Bronchitis   . Cancer [facility])    right breast  . Diabetes mellitus without complication ([place])    type 2  . Family history of breast cancer   . Family history of pancreatic cancer   . Family history of prostate cancer   . GERD (gastroesophageal reflux disease)   . High cholesterol   . Hypertension     Past Surgical History:  Procedure Laterality Date  . ABDOMINAL HYSTERECTOMY    . COLONOSCOPY    . MASTECTOMY W/ SENTINEL NODE BIOPSY Right 08/26/2016   Procedure: RIGHT MASTECTOMY WITH SENTINEL LYMPH NODE BIOPSY;  Surgeon: Autumn Messing III, MD;  Location: [place];  Service: General;  Laterality: Right;  . PORTACATH PLACEMENT Left 08/26/2016   Procedure: INSERTION PORT-A-CATH;  Surgeon: Autumn Messing III, MD;  Location: [place];  Service: General;  Laterality: Left;  . TUBAL LIGATION    . WISDOM TOOTH EXTRACTION      There were no vitals filed for this visit.      Subjective Assessment - 12/10/16 1024    Subjective My arm is doing good today. Sometimes it is hard to stretch out. I went to get the sleeve and I called [facility] and they told me to get another prescription.                   Katina Dung - 12/10/16 0001    Open a tight or new jar Mild difficulty   Do heavy household chores (wash walls, wash floors)  Mild difficulty   Carry a shopping bag or briefcase Mild difficulty   Wash your back No difficulty   Use a knife to cut food No difficulty   Recreational activities in which you take some force or impact through your arm, shoulder, or hand (golf, hammering, tennis) Mild difficulty   During the past week, to what extent has your arm, shoulder or hand problem interfered with your normal social activities with family, friends, neighbors, or groups? Not at all   During the past week, to what extent has your arm, shoulder or hand problem limited your work or other regular daily activities Not at all   Arm, shoulder, or hand pain. Mild   Tingling (pins and needles) in your arm, shoulder, or hand Mild   Difficulty Sleeping Mild difficulty   DASH Score 15.91 %               [facility] Adult PT Treatment/Exercise - 12/10/16 0001      Manual Therapy   Manual Therapy Passive ROM;Myofascial release   Myofascial Release cross hand to area of cording at elbow   Passive ROM In Supine to Rt shoulder in direction of flexion, abduction to pts tolerance. Passive prolonged stretching to areas of cording  down into arm                 PT Education - 12/10/16 1236    Education provided Yes   Education Details how to obtain prescription for sleeve, to lay with arm outstretched in bed and allow cording to stretch, to use RUE more often especially when reaching, how often and when to wear compression sleeve   Person(s) Educated Patient   Methods Explanation   Comprehension Verbalized understanding           Short Term Clinic Goals - 12/10/16 1037      CC Short Term Goal  #1   Title Patient with verbalize an understanding of lymphedema risk reduction precautions   Baseline 10/29/16- pt states she is familiar with these items   Time 3   Period Weeks   Status Achieved     CC Short Term Goal  #2   Title Patient will be independent in a basic  home exercise program   Status Achieved            Breast Clinic Goals - 08/05/16 1135      Patient will be able to verbalize understanding of pertinent lymphedema risk reduction practices relevant to her diagnosis specifically related to skin care.   Time 1   Period Days   Status Achieved     Patient will be able to return demonstrate and/or verbalize understanding of the post-op home exercise program related to regaining shoulder range of motion.   Time 1   Period Days   Status Achieved     Patient will be able to verbalize understanding of the importance of attending the postoperative After Breast Cancer Class for further lymphedema risk reduction education and therapeutic exercise.   Time 1   Period Days   Status Achieved          Long Term Clinic Goals - 12/10/16 1037      CC Long Term Goal  #1   Title Patient will improve right shoulder flexion range of motion to 150 degrees to perform activities of daily living and household chores with greater ease   Baseline 75 degrees at eval; 93 degrees-10/08/16, 10/29/16- 126 degrees, 11/12/16- 125 degrees; 132 degrees-11/26/16, 12/10/16- 130 degrees   Time 6   Period Weeks   Status Not Met     CC Long Term Goal  #2   Title Patient will improve right shoulder abduction to 120 degrees so that she can be independent in dressing    Baseline 73 degrees at eval; 92 degrees-10/08/16, 10/29/16- 105 degrees, 11/12/16- 115 degrees; 119 degrees-11/26/16- 120 degrees   Time 6   Period Weeks   Status Achieved     CC Long Term Goal  #3   Title Patient will decrease the DASH score to < 38   to demonstrate increased functional use of upper extremity   Baseline 54, 10/29/16- 43, 12/10/16- 15.91   Time 6   Period Weeks   Status Achieved            Plan - 12/10/16 1233    Clinical Impression Statement Patient states she is going to get measured for a compression sleeve today. Gave pt information to obtain a prescription for her doctor for compression sleeve and gauntlet. Assessed pt's  progress towards goals in therapy today. She has met all goals for therapy except for her right shoulder flexion goal which she was 20 degrees shy of meeting. She has had marked improvement in  her cording since time of evaluation. Pt will be discharged from skilled PT services at this time.    Rehab Potential Good   Clinical Impairments Affecting Rehab Potential 17 nodes removed.  will have ongoing chemotherapy    PT Frequency 1x / week   PT Duration 6 weeks   PT Treatment/Interventions ADLs/Self Care Home Management;Patient/family education;Orthotic Fit/Training;Taping;Manual techniques;Manual lymph drainage;Compression bandaging;Passive range of motion;Neuromuscular re-education;Therapeutic exercise;Therapeutic activities;Functional mobility training   PT Next Visit Plan dc this visit   PT Home Exercise Plan Post op shoulder ROM HEP and supine scapular series with yellow theraband   Consulted and Agree with Plan of Care Patient      Patient will benefit from skilled therapeutic intervention in order to improve the following deficits and impairments:  Postural dysfunction, Decreased knowledge of precautions, Pain, Impaired UE functional use, Decreased range of motion, Decreased skin integrity, Increased edema, Decreased strength, Decreased knowledge of use of DME, Decreased activity tolerance  Visit Diagnosis: Acute pain of right shoulder  Stiffness of right shoulder, not elsewhere classified       G-Codes - 2016-12-30 1235    Functional Assessment Tool Used (Outpatient Only) clinical judgment   Functional Limitation Carrying, moving and handling objects   Carrying, Moving and Handling Objects Goal Status ([id number]) At least 20 percent but less than 40 percent impaired, limited or restricted   Carrying, Moving and Handling Objects Discharge Status [phone]) At least 20 percent but less than 40 percent impaired, limited or restricted      Problem List Patient Active Problem List   Diagnosis  Date Noted  . Family history of breast cancer   . Family history of pancreatic cancer   . Family history of prostate cancer   . Port catheter in place 09/25/2016  . Malignant neoplasm of upper-outer quadrant of right breast in female, estrogen receptor positive ([place]) 08/05/2016  . Breast cancer of upper-outer quadrant of right female breast ([place]) 07/31/2016    Allyson Sabal [facility] Dec 30, 2016, 12:37 PM  [place], [place], [address] Phone: [phone]   Fax:  [phone]  Name: Casey Olson MRN: [id number] Date of Birth: 1950-12-26  PHYSICAL THERAPY DISCHARGE SUMMARY  Visits from Start of Care: 14  Current functional level related to goals / functional outcomes: Still 20 degrees shy of meeting shoulder flexion goal, met all other goals for therapy, met abduction goal, decreased cording   Remaining deficits: Decreased right shoulder flexion   Education / Equipment: HEP, compression sleeve and gauntlet- pt being measured today Plan: Patient agrees to discharge.  Patient goals were partially met. Patient is being discharged due to meeting the stated rehab goals.  ?????    Allyson Sabal [place], [place] Dec 30, 2016 12:39 PM

## 2016-12-10 NOTE — Assessment & Plan Note (Signed)
07/24/2016: Palpable right breast mass for 2 months UOQ at 10:00: 4.7 x 4.1 x 3.7 cm with enlarged axillary lymph node biopsy benign, breast mass biopsy grade 3 IDC ER 30% week PR negative HER-2 negative Ki-67 90% CT CAP and Bone scan: No mets  Treatment plan based on multidisciplinary tumor board: 1. Right mastectomy01/24/2018: IDC grade 3, 7 cm, margins negative, 1/17 nodes positive, ER weakly +30%, PR negative, HER-2 negative, Ki-67 90%, T3 N1 stage IIIa 2. Adjuvant chemotherapy with Adriamycin and Cytoxan dose dense 4 followed by Abraxane weekly 12 (Patient cannot state steroids due to diabetes and so we are not using Taxol) 3. Followed by adjuvant radiation therapy 4. Followed by adjuvant antiestrogen therapy -------------------------------------------------------------------------------------------------------------------------- Current treatment: Completed 4 cycles ofdose dense Adriamycin Cytoxan, today is cycle 4 Abraxane Echocardiogram 09/16/2016: EF 60-65 %  Chemotherapy toxicities: 1. Fatigue due to chemotherapy 2. nausea grade 1 3. 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 Return to clinic weekly for Abraxane and every 2 weeks for follow-up with me

## 2016-12-11 ENCOUNTER — Other Ambulatory Visit (HOSPITAL_BASED_OUTPATIENT_CLINIC_OR_DEPARTMENT_OTHER): Payer: Medicare Other

## 2016-12-11 ENCOUNTER — Ambulatory Visit (HOSPITAL_BASED_OUTPATIENT_CLINIC_OR_DEPARTMENT_OTHER): Payer: Medicare Other | Admitting: Hematology and Oncology

## 2016-12-11 ENCOUNTER — Telehealth: Payer: Self-pay | Admitting: Genetic Counselor

## 2016-12-11 ENCOUNTER — Ambulatory Visit (HOSPITAL_BASED_OUTPATIENT_CLINIC_OR_DEPARTMENT_OTHER): Payer: Medicare Other

## 2016-12-11 ENCOUNTER — Encounter: Payer: Self-pay | Admitting: Hematology and Oncology

## 2016-12-11 ENCOUNTER — Ambulatory Visit: Payer: Medicare Other

## 2016-12-11 ENCOUNTER — Encounter: Payer: Self-pay | Admitting: Genetic Counselor

## 2016-12-11 DIAGNOSIS — C50411 Malignant neoplasm of upper-outer quadrant of right female breast: Secondary | ICD-10-CM

## 2016-12-11 DIAGNOSIS — Z170 Estrogen receptor positive status [ER+]: Principal | ICD-10-CM

## 2016-12-11 DIAGNOSIS — T451X5A Adverse effect of antineoplastic and immunosuppressive drugs, initial encounter: Secondary | ICD-10-CM

## 2016-12-11 DIAGNOSIS — G620 Drug-induced polyneuropathy: Secondary | ICD-10-CM

## 2016-12-11 DIAGNOSIS — G629 Polyneuropathy, unspecified: Secondary | ICD-10-CM | POA: Insufficient documentation

## 2016-12-11 DIAGNOSIS — Z95828 Presence of other vascular implants and grafts: Secondary | ICD-10-CM

## 2016-12-11 DIAGNOSIS — Z5111 Encounter for antineoplastic chemotherapy: Secondary | ICD-10-CM | POA: Diagnosis not present

## 2016-12-11 DIAGNOSIS — D649 Anemia, unspecified: Secondary | ICD-10-CM

## 2016-12-11 DIAGNOSIS — Z1379 Encounter for other screening for genetic and chromosomal anomalies: Secondary | ICD-10-CM | POA: Insufficient documentation

## 2016-12-11 LAB — COMPREHENSIVE METABOLIC PANEL
ALBUMIN: 3.9 g/dL (ref 3.5–5.0)
ALT: 15 U/L (ref 0–55)
ANION GAP: 9 meq/L (ref 3–11)
AST: 15 U/L (ref 5–34)
Alkaline Phosphatase: 72 U/L (ref 40–150)
BILIRUBIN TOTAL: 0.33 mg/dL (ref 0.20–1.20)
BUN: 11.1 mg/dL (ref 7.0–26.0)
CHLORIDE: 104 meq/L (ref 98–109)
CO2: 26 mEq/L (ref 22–29)
CREATININE: 0.6 mg/dL (ref 0.6–1.1)
Calcium: 9.5 mg/dL (ref 8.4–10.4)
EGFR: >90 mL/min/{1.73_m2} (ref >90)
Glucose: 123 mg/dl (ref 70–140)
Potassium: 3.8 mEq/L (ref 3.5–5.1)
Sodium: 138 mEq/L (ref 136–145)
TOTAL PROTEIN: 7.2 g/dL (ref 6.4–8.3)

## 2016-12-11 LAB — CBC WITH DIFFERENTIAL/PLATELET
BASO%: 0.8 % (ref 0.0–2.0)
Basophils Absolute: 0 10*3/uL (ref 0.0–0.1)
EOS ABS: 0.4 10*3/uL (ref 0.0–0.5)
EOS%: 9.9 % — ABNORMAL HIGH (ref 0.0–7.0)
HCT: 29.7 % — ABNORMAL LOW (ref 34.8–46.6)
HEMOGLOBIN: 9.8 g/dL — AB (ref 11.6–15.9)
LYMPH#: 0.8 10*3/uL — AB (ref 0.9–3.3)
LYMPH%: 19.9 % (ref 14.0–49.7)
MCH: 30.1 pg (ref 25.1–34.0)
MCHC: 33 g/dL (ref 31.5–36.0)
MCV: 91.1 fL (ref 79.5–101.0)
MONO#: 0.6 10*3/uL (ref 0.1–0.9)
MONO%: 16.8 % — ABNORMAL HIGH (ref 0.0–14.0)
NEUT%: 52.6 % (ref 38.4–76.8)
NEUTROS ABS: 2 10*3/uL (ref 1.5–6.5)
PLATELETS: 258 10*3/uL (ref 145–400)
RBC: 3.26 10*6/uL — AB (ref 3.70–5.45)
RDW: 18.6 % — AB (ref 11.2–14.5)
WBC: 3.8 10*3/uL — AB (ref 3.9–10.3)

## 2016-12-11 MED ORDER — SODIUM CHLORIDE 0.9 % IV SOLN
Freq: Once | INTRAVENOUS | Status: AC
  Administered 2016-12-11: 13:00:00 via INTRAVENOUS

## 2016-12-11 MED ORDER — ONDANSETRON HCL 4 MG/2ML IJ SOLN
8.0000 mg | Freq: Once | INTRAMUSCULAR | Status: AC
  Administered 2016-12-11: 8 mg via INTRAVENOUS

## 2016-12-11 MED ORDER — PACLITAXEL PROTEIN-BOUND CHEMO INJECTION 100 MG
65.0000 mg/m2 | Freq: Once | INTRAVENOUS | Status: AC
  Administered 2016-12-11: 125 mg via INTRAVENOUS
  Filled 2016-12-11: qty 25

## 2016-12-11 MED ORDER — ONDANSETRON HCL 4 MG/2ML IJ SOLN
INTRAMUSCULAR | Status: AC
  Filled 2016-12-11: qty 4

## 2016-12-11 MED ORDER — HEPARIN SOD (PORK) LOCK FLUSH 100 UNIT/ML IV SOLN
500.0000 [IU] | Freq: Once | INTRAVENOUS | Status: AC | PRN
  Administered 2016-12-11: 500 [IU]
  Filled 2016-12-11: qty 5

## 2016-12-11 MED ORDER — SODIUM CHLORIDE 0.9% FLUSH
10.0000 mL | INTRAVENOUS | Status: DC | PRN
  Administered 2016-12-11: 10 mL
  Filled 2016-12-11: qty 10

## 2016-12-11 MED ORDER — SODIUM CHLORIDE 0.9% FLUSH
10.0000 mL | INTRAVENOUS | Status: DC | PRN
  Administered 2016-12-11: 10 mL via INTRAVENOUS
  Filled 2016-12-11: qty 10

## 2016-12-11 NOTE — Patient Instructions (Signed)
Implanted Port Home Guide An implanted port is a type of central line that is placed under the skin. Central lines are used to provide IV access when treatment or nutrition needs to be given through a person's veins. Implanted ports are used for long-term IV access. An implanted port may be placed because:  You need IV medicine that would be irritating to the small veins in your hands or arms.  You need long-term IV medicines, such as antibiotics.  You need IV nutrition for a long period.  You need frequent blood draws for lab tests.  You need dialysis.  Implanted ports are usually placed in the chest area, but they can also be placed in the upper arm, the abdomen, or the leg. An implanted port has two main parts:  Reservoir. The reservoir is round and will appear as a small, raised area under your skin. The reservoir is the part where a needle is inserted to give medicines or draw blood.  Catheter. The catheter is a thin, flexible tube that extends from the reservoir. The catheter is placed into a large vein. Medicine that is inserted into the reservoir goes into the catheter and then into the vein.  How will I care for my incision site? Do not get the incision site wet. Bathe or shower as directed by your health care provider. How is my port accessed? Special steps must be taken to access the port:  Before the port is accessed, a numbing cream can be placed on the skin. This helps numb the skin over the port site.  Your health care provider uses a sterile technique to access the port. ? Your health care provider must put on a mask and sterile gloves. ? The skin over your port is cleaned carefully with an antiseptic and allowed to dry. ? The port is gently pinched between sterile gloves, and a needle is inserted into the port.  Only "non-coring" port needles should be used to access the port. Once the port is accessed, a blood return should be checked. This helps ensure that the port  is in the vein and is not clogged.  If your port needs to remain accessed for a constant infusion, a clear (transparent) bandage will be placed over the needle site. The bandage and needle will need to be changed every week, or as directed by your health care provider.  Keep the bandage covering the needle clean and dry. Do not get it wet. Follow your health care provider's instructions on how to take a shower or bath while the port is accessed.  If your port does not need to stay accessed, no bandage is needed over the port.  What is flushing? Flushing helps keep the port from getting clogged. Follow your health care provider's instructions on how and when to flush the port. Ports are usually flushed with saline solution or a medicine called heparin. The need for flushing will depend on how the port is used.  If the port is used for intermittent medicines or blood draws, the port will need to be flushed: ? After medicines have been given. ? After blood has been drawn. ? As part of routine maintenance.  If a constant infusion is running, the port may not need to be flushed.  How long will my port stay implanted? The port can stay in for as long as your health care provider thinks it is needed. When it is time for the port to come out, surgery will be   done to remove it. The procedure is similar to the one performed when the port was put in. When should I seek immediate medical care? When you have an implanted port, you should seek immediate medical care if:  You notice a bad smell coming from the incision site.  You have swelling, redness, or drainage at the incision site.  You have more swelling or pain at the port site or the surrounding area.  You have a fever that is not controlled with medicine.  This information is not intended to replace advice given to you by your health care provider. Make sure you discuss any questions you have with your health care provider. Document  Released: 07/20/2005 Document Revised: 12/26/2015 Document Reviewed: 03/27/2013 Elsevier Interactive Patient Education  2017 [employer] Inc.  

## 2016-12-11 NOTE — Progress Notes (Signed)
Patient Care Team: Nolene Ebbs, MD as PCP - General (Internal Medicine) Jovita Kussmaul, MD as Consulting Physician (General Surgery) Nicholas Lose, MD as Consulting Physician (Hematology and Oncology) Kyung Rudd, MD as Consulting Physician (Radiation Oncology) Delice Bison Charlestine Massed, NP as Nurse Practitioner (Hematology and Oncology)  DIAGNOSIS:  Encounter Diagnosis  Name Primary?  . Malignant neoplasm of upper-outer quadrant of right breast in female, estrogen receptor positive ([place])     SUMMARY OF ONCOLOGIC HISTORY:   Breast cancer of upper-outer quadrant of right female breast ([place])   07/24/2016 Initial Diagnosis    Palpable right breast mass for 2 months UOQ at 10:00: 4.7 x 4.1 x 3.7 cm with enlarged axillary lymph node biopsy benign, breast mass biopsy grade 3 IDC ER 30% week PR negative HER-2 negative Ki-67 90%      08/26/2016 Surgery    Right mastectomy: IDC grade 3, 7 cm, margins negative, 1/17 nodes positive, ER weakly +30%, PR negative, HER-2 negative, Ki-67 90%, T3 N1 stage IIIa      09/25/2016 -  Chemotherapy     Dose dense Adriamycin Cytoxan 4 followed y weelyAbraxane 12       11/23/2016 Genetic Testing    Results pending.  Genes tested include: APC, ATM, AXIN2, BARD1, BMPR1A, BRCA1, BRCA2, BRIP1, CDH1, CDKN2A (p14ARF), CDKN2A (p16INK4a), CHEK2, DICER1, EPCAM (Deletion/duplication testing only), GREM1 (promoter region deletion/duplication testing only), KIT, MEN1, MLH1, MSH2, MSH6, MUTYH, NBN, NF1, PALB2, PDGFRA, PMS2, POLD1, POLE, PTEN, RAD50, RAD51C, RAD51D, SDHB, SDHC, SDHD, SMAD4, SMARCA4. STK11, TP53, TSC1, TSC2, and VHL.  The following gene was evaluated for sequence changes only: SDHA and HOXB13 c.251G>A variant only.       CHIEF COMPLIANT: Cycle 4 Abraxane  INTERVAL HISTORY: Casey Olson is a 66 year old with above-mentioned history of right breast cancer treated with mastectomy and is currently on adjuvant chemotherapy. Today is cycle 4 of  Abraxane. She is tolerating it very well. We had to reduce the dosage of Abraxane after cycle 1 and since then she has done much better. Denies any nausea vomiting of significance. She has neuropathy with the neuropathy appears to be stable. Things have not gotten any worse.  REVIEW OF SYSTEMS:   Constitutional: Denies fevers, chills or abnormal weight loss Eyes: Denies blurriness of vision Ears, nose, mouth, throat, and face: Denies mucositis or sore throat Respiratory: Denies cough, dyspnea or wheezes Cardiovascular: Denies palpitation, chest discomfort Gastrointestinal:  Denies nausea, heartburn or change in bowel habits Skin: Denies abnormal skin rashes Lymphatics: Denies new lymphadenopathy or easy bruising Neurological: Neuropathy in fingers and toes stable grade 1-2 Behavioral/Psych: Mood is stable, no new changes  Extremities: No lower extremity edema Breast:  denies any pain or lumps or nodules in either breasts All other systems were reviewed with the patient and are negative.  I have reviewed the past medical history, past surgical history, social history and family history with the patient and they are unchanged from previous note.  ALLERGIES:  is allergic to percocet [oxycodone-acetaminophen].  MEDICATIONS:  Current Outpatient Prescriptions  Medication Sig Dispense Refill  . acetaminophen (TYLENOL) 325 MG tablet Take 650 mg by mouth every 6 (six) hours as needed for mild pain.    Marland Kitchen aspirin EC 81 MG tablet Take 81 mg by mouth daily.    . BD PEN NEEDLE NANO U/F 32G X 4 MM MISC USE TO INJECT INSULIN EVERY DAY (E11.65)  5  . Blood Glucose Monitoring Suppl (ONETOUCH VERIO FLEX SYSTEM) w/Device KIT USE TO TEST 3  TIMES DAILY DX E11.65  11  . Calcium Carbonate-Vitamin D (CALTRATE 600+D PO) Take 1 tablet by mouth 2 (two) times daily.    Marland Kitchen gabapentin (NEURONTIN) 100 MG capsule Take 1 capsule (100 mg total) by mouth 3 (three) times daily. 90 capsule 3  . Hyprom-Naphaz-Polysorb-Zn Sulf  (CLEAR EYES COMPLETE OP) Apply 2 drops to eye 2 (two) times daily as needed (irritation).    Marland Kitchen LANTUS SOLOSTAR 100 UNIT/ML Solostar Pen INJECT 20 UNITS IN THE MORNING  5  . lidocaine-prilocaine (EMLA) cream Apply to affected area once 30 g 3  . lidocaine-prilocaine (EMLA) cream Apply to affected area once 30 g 3  . loratadine (CLARITIN) 10 MG tablet Take 10 mg by mouth daily as needed for allergies or rhinitis.    Marland Kitchen LORazepam (ATIVAN) 0.5 MG tablet Take 1 tablet (0.5 mg total) by mouth every 6 (six) hours as needed (Nausea or vomiting). 30 tablet 0  . LORazepam (ATIVAN) 0.5 MG tablet Take 1 tablet (0.5 mg total) by mouth every 6 (six) hours as needed (Nausea or vomiting). 30 tablet 0  . losartan (COZAAR) 100 MG tablet Take 100 mg by mouth daily.  2  . losartan (COZAAR) 50 MG tablet Take 50 mg by mouth daily.  5  . metFORMIN (GLUCOPHAGE) 1000 MG tablet Take 1,000 mg by mouth 2 (two) times daily.  5  . Multiple Vitamin (MULTIVITAMIN WITH MINERALS) TABS tablet Take 1 tablet by mouth daily.    . ondansetron (ZOFRAN) 8 MG tablet Take 1 tablet (8 mg total) by mouth 2 (two) times daily as needed. Start on the third day after chemotherapy. 30 tablet 1  . ondansetron (ZOFRAN) 8 MG tablet Take 1 tablet (8 mg total) by mouth 2 (two) times daily as needed (Nausea or vomiting). 30 tablet 1  . ONETOUCH DELICA LANCETS FINE MISC USE TO TEST 3 TIMES DAILY DX E11.65  11  . ONETOUCH VERIO test strip USE TO TEST 3 TIMES DAILY DX E11.65  11  . prochlorperazine (COMPAZINE) 10 MG tablet Take 1 tablet (10 mg total) by mouth every 6 (six) hours as needed (Nausea or vomiting). 30 tablet 1  . prochlorperazine (COMPAZINE) 10 MG tablet Take 1 tablet (10 mg total) by mouth every 6 (six) hours as needed (Nausea or vomiting). 30 tablet 1  . simvastatin (ZOCOR) 10 MG tablet Take 10 mg by mouth daily at 6 PM.   2  . thiamine (VITAMIN B-1) 100 MG tablet Take 100 mg by mouth daily.     No current facility-administered medications  for this visit.    Facility-Administered Medications Ordered in Other Visits  Medication Dose Route Frequency Provider Last Rate Last Dose  . sodium chloride flush (NS) 0.9 % injection 10 mL  10 mL Intravenous PRN Nicholas Lose, MD   10 mL at 09/25/16 1112    PHYSICAL EXAMINATION: ECOG PERFORMANCE STATUS: 1 - Symptomatic but completely ambulatory  Vitals:   12/11/16 1143  BP: (!) 158/61  Pulse: 89  Resp: 18  Temp: 98.6 F (37 C)   Filed Weights   12/11/16 1143  Weight: 158 lb 9.6 oz (71.9 kg)    GENERAL:alert, no distress and comfortable SKIN: skin color, texture, turgor are normal, no rashes or significant lesions EYES: normal, Conjunctiva are pink and non-injected, sclera clear OROPHARYNX:no exudate, no erythema and lips, buccal mucosa, and tongue normal  NECK: supple, thyroid normal size, non-tender, without nodularity LYMPH:  no palpable lymphadenopathy in the cervical, axillary or inguinal LUNGS:  clear to auscultation and percussion with normal breathing effort HEART: regular rate & rhythm and no murmurs and no lower extremity edema ABDOMEN:abdomen soft, non-tender and normal bowel sounds MUSCULOSKELETAL:no cyanosis of digits and no clubbing  NEURO: alert & oriented x 3 with fluent speech, grade 1-2 neuropathy EXTREMITIES: No lower extremity edema  LABORATORY DATA:  I have reviewed the data as listed   Chemistry      Component Value Date/Time   NA 140 12/04/2016 1127   K 4.0 12/04/2016 1127   CL 101 08/21/2016 1333   CO2 25 12/04/2016 1127   BUN 12.2 12/04/2016 1127   CREATININE 0.6 12/04/2016 1127      Component Value Date/Time   CALCIUM 9.2 12/04/2016 1127   ALKPHOS 71 12/04/2016 1127   AST 11 12/04/2016 1127   ALT 13 12/04/2016 1127   BILITOT 0.22 12/04/2016 1127       Lab Results  Component Value Date   WBC 3.7 (L) 12/04/2016   HGB 9.5 (L) 12/04/2016   HCT 29.0 (L) 12/04/2016   MCV 90.3 12/04/2016   PLT 288 12/04/2016   NEUTROABS 1.7  12/04/2016    ASSESSMENT & PLAN:  Breast cancer of upper-outer quadrant of right female breast ([place]) 07/24/2016: Palpable right breast mass for 2 months UOQ at 10:00: 4.7 x 4.1 x 3.7 cm with enlarged axillary lymph node biopsy benign, breast mass biopsy grade 3 IDC ER 30% week PR negative HER-2 negative Ki-67 90% CT CAP and Bone scan: No mets  Treatment plan based on multidisciplinary tumor board: 1. Right mastectomy01/24/2018: IDC grade 3, 7 cm, margins negative, 1/17 nodes positive, ER weakly +30%, PR negative, HER-2 negative, Ki-67 90%, T3 N1 stage IIIa 2. Adjuvant chemotherapy with Adriamycin and Cytoxan dose dense 4 followed by Abraxane weekly 12 (Patient cannot state steroids due to diabetes and so we are not using Taxol) 3. Followed by adjuvant radiation therapy 4. Followed by adjuvant antiestrogen therapy -------------------------------------------------------------------------------------------------------------------------- Current treatment: Completed 4 cycles ofdose dense Adriamycin Cytoxan, today is cycle 4 Abraxane Echocardiogram 09/16/2016: EF 60-65 %  Chemotherapy toxicities: 1. Fatigue due to chemotherapy 2. nausea grade 1 3. 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 her symptoms are currently stable. Return to clinic weekly for Abraxane and every 2 weeks for follow-up with me  I spent 25 minutes talking to the patient of which more than half was spent in counseling and coordination of care.  No orders of the defined types were placed in this encounter.  The patient has a good understanding of the overall plan. she agrees with it. she will call with any problems that may develop before the next visit here.   Rulon Eisenmenger, MD 12/11/16

## 2016-12-11 NOTE — Telephone Encounter (Signed)
Revealed negative genetic testing.  Discussed that we do not know why she has breast cancer or why there is cancer in the family. It could be due to a different gene that we are not testing, or maybe our current technology may not be able to pick something up.  It will be important for her to keep in contact with genetics to keep up with whether additional testing may be needed. Revealed that two VUS were found, but we still consider this a negative test.  We will call when these VUS are reclassified.

## 2016-12-11 NOTE — Patient Instructions (Signed)
[facility] Discharge Instructions for Patients Receiving Chemotherapy  Today you received the following chemotherapy agents: Abraxane   To help prevent nausea and vomiting after your treatment, we encourage you to take your nausea medication as directed.    If you develop nausea and vomiting that is not controlled by your nausea medication, call the clinic.   BELOW ARE SYMPTOMS THAT SHOULD BE REPORTED IMMEDIATELY:  *FEVER GREATER THAN 100.5 F  *CHILLS WITH OR WITHOUT FEVER  NAUSEA AND VOMITING THAT IS NOT CONTROLLED WITH YOUR NAUSEA MEDICATION  *UNUSUAL SHORTNESS OF BREATH  *UNUSUAL BRUISING OR BLEEDING  TENDERNESS IN MOUTH AND THROAT WITH OR WITHOUT PRESENCE OF ULCERS  *URINARY PROBLEMS  *BOWEL PROBLEMS  UNUSUAL RASH Items with * indicate a potential emergency and should be followed up as soon as possible.  Feel free to call the clinic you have any questions or concerns. The clinic phone number is [phone].  Please show the CHEMO ALERT CARD at check-in to the Emergency Department and triage nurse.   

## 2016-12-15 ENCOUNTER — Encounter (HOSPITAL_COMMUNITY): Payer: Self-pay

## 2016-12-15 ENCOUNTER — Ambulatory Visit (HOSPITAL_BASED_OUTPATIENT_CLINIC_OR_DEPARTMENT_OTHER)
Admission: RE | Admit: 2016-12-15 | Discharge: 2016-12-15 | Disposition: A | Discharge status: Home / Self Care | Payer: Medicare Other | Source: Ambulatory Visit | Attending: Cardiology | Admitting: Cardiology

## 2016-12-15 ENCOUNTER — Ambulatory Visit (HOSPITAL_COMMUNITY)
Admission: RE | Admit: 2016-12-15 | Discharge: 2016-12-15 | Disposition: A | Discharge status: Home / Self Care | Payer: Medicare Other | Source: Ambulatory Visit | Attending: Internal Medicine | Admitting: Internal Medicine

## 2016-12-15 VITALS — BP 142/65 | HR 80 | Wt 160.0 lb

## 2016-12-15 DIAGNOSIS — Z09 Encounter for follow-up examination after completed treatment for conditions other than malignant neoplasm: Secondary | ICD-10-CM | POA: Diagnosis present

## 2016-12-15 DIAGNOSIS — E119 Type 2 diabetes mellitus without complications: Secondary | ICD-10-CM | POA: Insufficient documentation

## 2016-12-15 DIAGNOSIS — I517 Cardiomegaly: Secondary | ICD-10-CM | POA: Diagnosis not present

## 2016-12-15 DIAGNOSIS — C50411 Malignant neoplasm of upper-outer quadrant of right female breast: Secondary | ICD-10-CM

## 2016-12-15 DIAGNOSIS — Z170 Estrogen receptor positive status [ER+]: Secondary | ICD-10-CM

## 2016-12-15 NOTE — Patient Instructions (Signed)
Follow up and Echo in 6 months

## 2016-12-15 NOTE — Progress Notes (Signed)
  Echocardiogram 2D Echocardiogram has been performed.  Casey Olson 12/15/2016, 11:48 AM

## 2016-12-17 NOTE — Progress Notes (Signed)
Oncology: Dr. Lindi Adie  66 yo with history of HTN, DM2, hyperlipidemia, and breast cancer presents for cardio-oncology appointment.  No prior cardiac history, nonsmoker, no family history of cardiomyopathy.     Breast cancer was diagnosed in 12/17, ER+/PR-/HER2-.  Right mastectomy in 1/18.  She completed Adriamycin/Cytoxan x 4, now getting abraxane weekly x 12.   No exertional dyspnea.  Has had some neuropathy-type symptoms.  No chest pain.   PMH: 1. HTN 2. Type II diabetes 3. Hyperlipidemia 4. GERD 5. Breast cancer: Diagnosed in 12/17, ER+/PR-/HER2-.  Right mastectomy in 1/18.  She will start Adriamycin/Cytoxan x 4 on 09/25/16, then abraxane weekly x 12.   - Echo (2/18): EF 60-65%, GLS -18.1%, normal RV size and systolic function.  - Echo (5/18): EF 60-65%, difficult images so strain likely not accurate.   Social History   Social History  . Marital status: Divorced    Spouse name: N/A  . Number of children: N/A  . Years of education: N/A   Social History Main Topics  . Smoking status: Never Smoker  . Smokeless tobacco: Never Used  . Alcohol use No  . Drug use: No  . Sexual activity: Not Asked   Other Topics Concern  . None   Social History Narrative  . None   Family History  Problem Relation Age of Onset  . Breast cancer Cousin        mat first cousin; dx in her late 45s to early 59s  . Stroke Mother   . Prostate cancer Father   . Pancreatic cancer Sister 89  . Sickle cell trait Brother   . Prostate cancer Maternal Uncle   . Breast cancer Paternal Aunt        dx over 52  . Leukemia Maternal Grandmother   . Heart attack Paternal Grandmother   . Heart attack Paternal Grandfather   . Sickle cell trait Sister   . Sickle cell anemia Other   . Colon polyps Sister   . Breast cancer Cousin        paternal first cousin died in her 69s  . Breast cancer Cousin        pat first cousin died in her 25s  . Breast cancer Cousin        pat first cousin dx over 60   ROS: All  systems reviewed and negative except as per HPI.  Current Outpatient Prescriptions  Medication Sig Dispense Refill  . acetaminophen (TYLENOL) 325 MG tablet Take 650 mg by mouth every 6 (six) hours as needed for mild pain.    Marland Kitchen aspirin EC 81 MG tablet Take 81 mg by mouth daily.    . BD PEN NEEDLE NANO U/F 32G X 4 MM MISC USE TO INJECT INSULIN EVERY DAY (E11.65)  5  . Blood Glucose Monitoring Suppl (ONETOUCH VERIO FLEX SYSTEM) w/Device KIT USE TO TEST 3 TIMES DAILY DX E11.65  11  . Calcium Carbonate-Vitamin D (CALTRATE 600+D PO) Take 1 tablet by mouth 2 (two) times daily.    Marland Kitchen gabapentin (NEURONTIN) 100 MG capsule Take 1 capsule (100 mg total) by mouth 3 (three) times daily. 90 capsule 3  . Hyprom-Naphaz-Polysorb-Zn Sulf (CLEAR EYES COMPLETE OP) Apply 2 drops to eye 2 (two) times daily as needed (irritation).    Marland Kitchen LANTUS SOLOSTAR 100 UNIT/ML Solostar Pen INJECT 20 UNITS IN THE MORNING  5  . lidocaine-prilocaine (EMLA) cream Apply to affected area once 30 g 3  . loratadine (CLARITIN) 10 MG tablet Take  10 mg by mouth daily as needed for allergies or rhinitis.    Marland Kitchen LORazepam (ATIVAN) 0.5 MG tablet Take 1 tablet (0.5 mg total) by mouth every 6 (six) hours as needed (Nausea or vomiting). 30 tablet 0  . losartan (COZAAR) 100 MG tablet Take 100 mg by mouth daily.  2  . metFORMIN (GLUCOPHAGE) 1000 MG tablet Take 1,000 mg by mouth 2 (two) times daily.  5  . Multiple Vitamin (MULTIVITAMIN WITH MINERALS) TABS tablet Take 1 tablet by mouth daily.    . ondansetron (ZOFRAN) 8 MG tablet Take 1 tablet (8 mg total) by mouth 2 (two) times daily as needed. Start on the third day after chemotherapy. 30 tablet 1  . ONETOUCH DELICA LANCETS FINE MISC USE TO TEST 3 TIMES DAILY DX E11.65  11  . ONETOUCH VERIO test strip USE TO TEST 3 TIMES DAILY DX E11.65  11  . prochlorperazine (COMPAZINE) 10 MG tablet Take 1 tablet (10 mg total) by mouth every 6 (six) hours as needed (Nausea or vomiting). 30 tablet 1  . simvastatin  (ZOCOR) 10 MG tablet Take 10 mg by mouth daily at 6 PM.   2  . thiamine (VITAMIN B-1) 100 MG tablet Take 100 mg by mouth daily.     No current facility-administered medications for this encounter.    Facility-Administered Medications Ordered in Other Encounters  Medication Dose Route Frequency Provider Last Rate Last Dose  . sodium chloride flush (NS) 0.9 % injection 10 mL  10 mL Intravenous PRN Nicholas Lose, MD   10 mL at 09/25/16 1112   BP (!) 142/65   Pulse 80   Wt 160 lb (72.6 kg)   SpO2 100%   BMI 28.34 kg/m   General: NAD Neck: JVP 6-7 cm, no thyromegaly or thyroid nodule.  Lungs: Clear bilaterally CV: Nondisplaced PMI.  Heart regular S1/S2, no S3/S4, no murmur.  No peripheral edema.  No carotid bruit.  Normal pedal pulses.  Abdomen: Soft, nontender, no hepatosplenomegaly, no distention.  Skin: Intact without lesions or rashes.  Neurologic: Alert and oriented x 3.  Psych: Normal affect. Extremities: No clubbing or cyanosis.  HEENT: Normal.   Assessment/Plan: 1. Breast cancer: She has completed Adriamycin therapy.  I reviewed today's echo, EF remains stable at 60-65%.  I do not think that strain images are accurate due to poor windows.   - Echo again in 6 months to catch any late Adriamycin effect.  2. HTN: BP mildly elevated but would not adjust meds.   Casey Olson 12/17/2016

## 2016-12-18 ENCOUNTER — Ambulatory Visit (HOSPITAL_BASED_OUTPATIENT_CLINIC_OR_DEPARTMENT_OTHER): Payer: Medicare Other

## 2016-12-18 ENCOUNTER — Ambulatory Visit: Payer: Medicare Other

## 2016-12-18 ENCOUNTER — Other Ambulatory Visit (HOSPITAL_BASED_OUTPATIENT_CLINIC_OR_DEPARTMENT_OTHER): Payer: Medicare Other

## 2016-12-18 DIAGNOSIS — Z170 Estrogen receptor positive status [ER+]: Principal | ICD-10-CM

## 2016-12-18 DIAGNOSIS — C50411 Malignant neoplasm of upper-outer quadrant of right female breast: Secondary | ICD-10-CM

## 2016-12-18 DIAGNOSIS — Z5111 Encounter for antineoplastic chemotherapy: Secondary | ICD-10-CM

## 2016-12-18 DIAGNOSIS — Z95828 Presence of other vascular implants and grafts: Secondary | ICD-10-CM

## 2016-12-18 LAB — CBC WITH DIFFERENTIAL/PLATELET
BASO%: 1 % (ref 0.0–2.0)
BASOS ABS: 0 10*3/uL (ref 0.0–0.1)
EOS%: 10.1 % — AB (ref 0.0–7.0)
Eosinophils Absolute: 0.4 10*3/uL (ref 0.0–0.5)
HEMATOCRIT: 31.1 % — AB (ref 34.8–46.6)
HEMOGLOBIN: 10.2 g/dL — AB (ref 11.6–15.9)
LYMPH#: 0.6 10*3/uL — AB (ref 0.9–3.3)
LYMPH%: 13.6 % — ABNORMAL LOW (ref 14.0–49.7)
MCH: 29.7 pg (ref 25.1–34.0)
MCHC: 32.7 g/dL (ref 31.5–36.0)
MCV: 90.6 fL (ref 79.5–101.0)
MONO#: 0.7 10*3/uL (ref 0.1–0.9)
MONO%: 17 % — ABNORMAL HIGH (ref 0.0–14.0)
NEUT#: 2.4 10*3/uL (ref 1.5–6.5)
NEUT%: 58.3 % (ref 38.4–76.8)
Platelets: 243 10*3/uL (ref 145–400)
RBC: 3.43 10*6/uL — ABNORMAL LOW (ref 3.70–5.45)
RDW: 18.3 % — ABNORMAL HIGH (ref 11.2–14.5)
WBC: 4.1 10*3/uL (ref 3.9–10.3)

## 2016-12-18 LAB — COMPREHENSIVE METABOLIC PANEL
ALBUMIN: 3.9 g/dL (ref 3.5–5.0)
ALK PHOS: 67 U/L (ref 40–150)
ALT: 16 U/L (ref 0–55)
AST: 14 U/L (ref 5–34)
Anion Gap: 8 mEq/L (ref 3–11)
BILIRUBIN TOTAL: 0.34 mg/dL (ref 0.20–1.20)
BUN: 10.7 mg/dL (ref 7.0–26.0)
CALCIUM: 9.5 mg/dL (ref 8.4–10.4)
CO2: 24 mEq/L (ref 22–29)
CREATININE: 0.7 mg/dL (ref 0.6–1.1)
Chloride: 107 mEq/L (ref 98–109)
EGFR: >90 mL/min/{1.73_m2} (ref >90)
Glucose: 97 mg/dl (ref 70–140)
POTASSIUM: 3.8 meq/L (ref 3.5–5.1)
Sodium: 140 mEq/L (ref 136–145)
TOTAL PROTEIN: 7.1 g/dL (ref 6.4–8.3)

## 2016-12-18 MED ORDER — SODIUM CHLORIDE 0.9% FLUSH
10.0000 mL | INTRAVENOUS | Status: DC | PRN
  Administered 2016-12-18: 10 mL via INTRAVENOUS
  Filled 2016-12-18: qty 10

## 2016-12-18 MED ORDER — SODIUM CHLORIDE 0.9% FLUSH
10.0000 mL | INTRAVENOUS | Status: DC | PRN
  Administered 2016-12-18: 10 mL
  Filled 2016-12-18: qty 10

## 2016-12-18 MED ORDER — PACLITAXEL PROTEIN-BOUND CHEMO INJECTION 100 MG
65.0000 mg/m2 | Freq: Once | INTRAVENOUS | Status: AC
  Administered 2016-12-18: 125 mg via INTRAVENOUS
  Filled 2016-12-18: qty 25

## 2016-12-18 MED ORDER — ONDANSETRON HCL 4 MG/2ML IJ SOLN
INTRAMUSCULAR | Status: AC
  Filled 2016-12-18: qty 4

## 2016-12-18 MED ORDER — HEPARIN SOD (PORK) LOCK FLUSH 100 UNIT/ML IV SOLN
500.0000 [IU] | Freq: Once | INTRAVENOUS | Status: AC | PRN
  Administered 2016-12-18: 500 [IU]
  Filled 2016-12-18: qty 5

## 2016-12-18 MED ORDER — ONDANSETRON HCL 4 MG/2ML IJ SOLN
8.0000 mg | Freq: Once | INTRAMUSCULAR | Status: AC
  Administered 2016-12-18: 8 mg via INTRAVENOUS

## 2016-12-18 MED ORDER — SODIUM CHLORIDE 0.9 % IV SOLN
Freq: Once | INTRAVENOUS | Status: AC
  Administered 2016-12-18: 14:00:00 via INTRAVENOUS

## 2016-12-18 NOTE — Patient Instructions (Signed)
[facility] Discharge Instructions for Patients Receiving Chemotherapy  Today you received the following chemotherapy agents: Abraxane   To help prevent nausea and vomiting after your treatment, we encourage you to take your nausea medication as directed.    If you develop nausea and vomiting that is not controlled by your nausea medication, call the clinic.   BELOW ARE SYMPTOMS THAT SHOULD BE REPORTED IMMEDIATELY:  *FEVER GREATER THAN 100.5 F  *CHILLS WITH OR WITHOUT FEVER  NAUSEA AND VOMITING THAT IS NOT CONTROLLED WITH YOUR NAUSEA MEDICATION  *UNUSUAL SHORTNESS OF BREATH  *UNUSUAL BRUISING OR BLEEDING  TENDERNESS IN MOUTH AND THROAT WITH OR WITHOUT PRESENCE OF ULCERS  *URINARY PROBLEMS  *BOWEL PROBLEMS  UNUSUAL RASH Items with * indicate a potential emergency and should be followed up as soon as possible.  Feel free to call the clinic you have any questions or concerns. The clinic phone number is [phone].  Please show the CHEMO ALERT CARD at check-in to the Emergency Department and triage nurse.   

## 2016-12-18 NOTE — Patient Instructions (Signed)

## 2016-12-24 ENCOUNTER — Ambulatory Visit: Payer: Self-pay | Admitting: Genetic Counselor

## 2016-12-24 DIAGNOSIS — Z1379 Encounter for other screening for genetic and chromosomal anomalies: Secondary | ICD-10-CM

## 2016-12-24 DIAGNOSIS — C50411 Malignant neoplasm of upper-outer quadrant of right female breast: Secondary | ICD-10-CM

## 2016-12-24 DIAGNOSIS — Z803 Family history of malignant neoplasm of breast: Secondary | ICD-10-CM

## 2016-12-24 DIAGNOSIS — Z8042 Family history of malignant neoplasm of prostate: Secondary | ICD-10-CM

## 2016-12-24 DIAGNOSIS — Z170 Estrogen receptor positive status [ER+]: Secondary | ICD-10-CM

## 2016-12-24 DIAGNOSIS — Z800 Family history of malignant neoplasm of digestive organs: Secondary | ICD-10-CM

## 2016-12-24 NOTE — Progress Notes (Addendum)
HPI: Casey Olson was previously seen in the [facility] clinic due to a personal and family history of cancer and concerns regarding a hereditary predisposition to cancer. Please refer to our prior cancer genetics clinic note for more information regarding Casey Olson's medical, social and family histories, and our assessment and recommendations, at the time. Casey Olson recent genetic test results were disclosed to her, as were recommendations warranted by these results. These results and recommendations are discussed in more detail below.  CANCER HISTORY:    Breast cancer of upper-outer quadrant of right female breast ([place])   07/24/2016 Initial Diagnosis    Palpable right breast mass for 2 months UOQ at 10:00: 4.7 x 4.1 x 3.7 cm with enlarged axillary lymph node biopsy benign, breast mass biopsy grade 3 IDC ER 30% week PR negative HER-2 negative Ki-67 90%      08/26/2016 Surgery    Right mastectomy: IDC grade 3, 7 cm, margins negative, 1/17 nodes positive, ER weakly +30%, PR negative, HER-2 negative, Ki-67 90%, T3 N1 stage IIIa      09/25/2016 -  Chemotherapy     Dose dense Adriamycin Cytoxan 4 followed y weelyAbraxane 12       11/23/2016 Genetic Testing    Results pending.  Genes tested include: APC, ATM, AXIN2, BARD1, BMPR1A, BRCA1, BRCA2, BRIP1, CDH1, CDKN2A (p14ARF), CDKN2A (p16INK4a), CHEK2, DICER1, EPCAM (Deletion/duplication testing only), GREM1 (promoter region deletion/duplication testing only), KIT, MEN1, MLH1, MSH2, MSH6, MUTYH, NBN, NF1, PALB2, PDGFRA, PMS2, POLD1, POLE, PTEN, RAD50, RAD51C, RAD51D, SDHB, SDHC, SDHD, SMAD4, SMARCA4. STK11, TP53, TSC1, TSC2, and VHL.  The following gene was evaluated for sequence changes only: SDHA and HOXB13 c.251G>A variant only.       Malignant neoplasm of upper-outer quadrant of right breast in female, estrogen receptor positive ([place])   08/05/2016 Initial Diagnosis    Malignant neoplasm of upper-outer quadrant of right  breast in female, estrogen receptor positive ([place])     12/03/2016 Genetic Testing    APC c.2213A>G and POLE c.1064A>G VUS found on the Common Hereditary cancer panel.  The Hereditary Gene Panel offered by [facility] includes sequencing and/or deletion duplication testing of the following 46 genes: APC, ATM, AXIN2, BARD1, BMPR1A, BRCA1, BRCA2, BRIP1, CDH1, CDKN2A (p14ARF), CDKN2A (p16INK4a), CHEK2, CTNNA1, DICER1, EPCAM (Deletion/duplication testing only), GREM1 (promoter region deletion/duplication testing only), KIT, MEN1, MLH1, MSH2, MSH3, MSH6, MUTYH, NBN, NF1, NHTL37mPALB2, PDGFRA, PMS2, POLD1, POLE, PTEN, RAD50, RAD51C, RAD51D, SDHB, SDHC, SDHD, SMAD4, SMARCA4. STK11, TP53, TSC1, TSC2, and VHL.  The following gene was evaluated for sequence changes only: SDHA and HOXB13 c.251G>A variant only.  The report date is Dec 03, 2016.        FAMILY HISTORY:  We obtained a detailed, 4-generation family history.  Significant diagnoses are listed below: Family History  Problem Relation Age of Onset  . Breast cancer Cousin        mat first cousin; dx in her late 220sto early 331s . Stroke Mother   . Prostate cancer Father   . Pancreatic cancer Sister 675 . Sickle cell trait Brother   . Prostate cancer Maternal Uncle   . Breast cancer Paternal Aunt        dx over 587 . Leukemia Maternal Grandmother   . Heart attack Paternal Grandmother   . Heart attack Paternal Grandfather   . Sickle cell trait Sister   . Sickle cell anemia Other   . Colon polyps Sister   . Breast  cancer Cousin        paternal first cousin died in her 5s  . Breast cancer Cousin        pat first cousin died in her 30s  . Breast cancer Cousin        pat first cousin dx over 29    The patient has two daughters who are cancer free.  She has five sisters and three brothers.  One sister died of pancreatic cancer at age 62 and another sister died of an ectopic pregnancy at 93.  Both parents are deceased.  Her mother died of a stroke.   Her mother had three sisters and five brothers.  One brother has prostate cancer and one sister had a daughter with breast cancer dx in her late 20's-early 30's.  The patient's maternal grandmother had leukemia.    The patient's father had prostate cancer.  He had 11 siblings.  One sister had breast cancer, and two other sisters had daughters with breast cancer.  The patient has a total of three paternal cousins with breast cancer, two diagnosed under 40.  Casey Olson is unaware of previous family history of genetic testing for hereditary cancer risks. Patient's maternal ancestors are of [place] and [place] descent, and paternal ancestors are of [place] and [place] descent. There is no reported Ashkenazi Jewish ancestry. There is no known consanguinity.  GENETIC TEST RESULTS: Genetic testing reported out on Dec 03, 2016 through the Common Hereditary cancer panel found no deleterious mutations.  The Hereditary Gene Panel offered by [facility] includes sequencing and/or deletion duplication testing of the following 46 genes: APC, ATM, AXIN2, BARD1, BMPR1A, BRCA1, BRCA2, BRIP1, CDH1, CDKN2A (p14ARF), CDKN2A (p16INK4a), CHEK2, CTNNA1, DICER1, EPCAM (Deletion/duplication testing only), GREM1 (promoter region deletion/duplication testing only), KIT, MEN1, MLH1, MSH2, MSH3, MSH6, MUTYH, NBN, NF1, NHTL1, PALB2, PDGFRA, PMS2, POLD1, POLE, PTEN, RAD50, RAD51C, RAD51D, SDHB, SDHC, SDHD, SMAD4, SMARCA4. STK11, TP53, TSC1, TSC2, and VHL.  The following genes were evaluated for sequence changes only: SDHA and HOXB13 c.251G>A variant only.  The test report has been scanned into EPIC and is located under the Molecular Pathology section of the Results Review tab.   We discussed with Casey Olson that since the current genetic testing is not perfect, it is possible there may be a gene mutation in one of these genes that current testing cannot detect, but that chance is small. We also  discussed, that it is possible that another gene that has not yet been discovered, or that we have not yet tested, is responsible for the cancer diagnoses in the family, and it is, therefore, important to remain in touch with cancer genetics in the future so that we can continue to offer Casey Olson the most up to date genetic testing.   Genetic testing did detect two Variants of Unknown Significance - one in the APC gene called c.2213A>G and another in the POLE gene called c.1064A>G. At this time, it is unknown if these variants are associated with increased cancer risk or if they are a normal finding, but most variants such as this get reclassified to being inconsequential. It should not be used to make medical management decisions. With time, we suspect the lab will determine the significance of these variants, if any. If we do learn more about them, we will try to contact Casey Olson to discuss it further. However, it is important to stay in touch with [place] periodically and keep the address and phone number up  to date.    CANCER SCREENING RECOMMENDATIONS:  This result is reassuring and indicates that Casey Olson likely does not have an increased risk for a future cancer due to a mutation in one of these genes. This normal test also suggests that Casey Olson's cancer was most likely not due to an inherited predisposition associated with one of these genes.  Most cancers happen by chance and this negative test suggests that her cancer falls into this category.  We, therefore, recommended she continue to follow the cancer management and screening guidelines provided by her oncology and primary healthcare provider.   RECOMMENDATIONS FOR FAMILY MEMBERS: Women in this family might be at some increased risk of developing cancer, over the general population risk, simply due to the family history of cancer. We recommended women in this family have a yearly mammogram beginning at age 2, or 62 years younger  than the earliest onset of cancer, an annual clinical breast exam, and perform monthly breast self-exams. Women in this family should also have a gynecological exam as recommended by their primary provider. All family members should have a colonoscopy by age 60.  Based on Casey Olson's family history, we recommended her paternal first cousins, who was diagnosed with breast cancer in their 38's, have genetic counseling and testing. Casey Olson will let us know if we can be of any assistance in coordinating genetic counseling and/or testing for this family member.   FOLLOW-UP: Lastly, we discussed with Casey Olson that cancer genetics is a rapidly advancing field and it is possible that new genetic tests will be appropriate for her and/or her family members in the future. We encouraged her to remain in contact with cancer genetics on an annual basis so we can update her personal and family histories and let her know of advances in cancer genetics that may benefit this family.   Our contact number was provided. Casey Olson questions were answered to her satisfaction, and she knows she is welcome to call us at anytime with additional questions or concerns.   Roma Kayser, MS, [facility] Certified Genetic Counselor [email or web address]

## 2016-12-25 ENCOUNTER — Other Ambulatory Visit (HOSPITAL_BASED_OUTPATIENT_CLINIC_OR_DEPARTMENT_OTHER): Payer: Medicare Other

## 2016-12-25 ENCOUNTER — Ambulatory Visit (HOSPITAL_BASED_OUTPATIENT_CLINIC_OR_DEPARTMENT_OTHER): Payer: Medicare Other

## 2016-12-25 ENCOUNTER — Ambulatory Visit: Payer: Medicare Other

## 2016-12-25 ENCOUNTER — Encounter: Payer: Self-pay | Admitting: Hematology and Oncology

## 2016-12-25 ENCOUNTER — Ambulatory Visit (HOSPITAL_BASED_OUTPATIENT_CLINIC_OR_DEPARTMENT_OTHER): Payer: Medicare Other | Admitting: Hematology and Oncology

## 2016-12-25 DIAGNOSIS — Z170 Estrogen receptor positive status [ER+]: Principal | ICD-10-CM

## 2016-12-25 DIAGNOSIS — G620 Drug-induced polyneuropathy: Secondary | ICD-10-CM

## 2016-12-25 DIAGNOSIS — C50411 Malignant neoplasm of upper-outer quadrant of right female breast: Secondary | ICD-10-CM

## 2016-12-25 DIAGNOSIS — Z5111 Encounter for antineoplastic chemotherapy: Secondary | ICD-10-CM | POA: Diagnosis not present

## 2016-12-25 DIAGNOSIS — D649 Anemia, unspecified: Secondary | ICD-10-CM | POA: Diagnosis not present

## 2016-12-25 DIAGNOSIS — Z95828 Presence of other vascular implants and grafts: Secondary | ICD-10-CM

## 2016-12-25 LAB — CBC WITH DIFFERENTIAL/PLATELET
BASO%: 0.7 % (ref 0.0–2.0)
BASOS ABS: 0 10*3/uL (ref 0.0–0.1)
EOS ABS: 0.3 10*3/uL (ref 0.0–0.5)
EOS%: 11.1 % — ABNORMAL HIGH (ref 0.0–7.0)
HEMATOCRIT: 31.3 % — AB (ref 34.8–46.6)
HEMOGLOBIN: 10.2 g/dL — AB (ref 11.6–15.9)
LYMPH#: 0.5 10*3/uL — AB (ref 0.9–3.3)
LYMPH%: 17 % (ref 14.0–49.7)
MCH: 29.4 pg (ref 25.1–34.0)
MCHC: 32.6 g/dL (ref 31.5–36.0)
MCV: 90.2 fL (ref 79.5–101.0)
MONO#: 0.6 10*3/uL (ref 0.1–0.9)
MONO%: 18.3 % — ABNORMAL HIGH (ref 0.0–14.0)
NEUT#: 1.6 10*3/uL (ref 1.5–6.5)
NEUT%: 52.9 % (ref 38.4–76.8)
PLATELETS: 213 10*3/uL (ref 145–400)
RBC: 3.47 10*6/uL — ABNORMAL LOW (ref 3.70–5.45)
RDW: 16.3 % — AB (ref 11.2–14.5)
WBC: 3.1 10*3/uL — ABNORMAL LOW (ref 3.9–10.3)

## 2016-12-25 LAB — COMPREHENSIVE METABOLIC PANEL
ALBUMIN: 3.9 g/dL (ref 3.5–5.0)
ALK PHOS: 72 U/L (ref 40–150)
ALT: 12 U/L (ref 0–55)
ANION GAP: 9 meq/L (ref 3–11)
AST: 12 U/L (ref 5–34)
BUN: 11.4 mg/dL (ref 7.0–26.0)
CALCIUM: 9.6 mg/dL (ref 8.4–10.4)
CHLORIDE: 106 meq/L (ref 98–109)
CO2: 25 mEq/L (ref 22–29)
CREATININE: 0.7 mg/dL (ref 0.6–1.1)
EGFR: >90 mL/min/{1.73_m2} (ref >90)
Glucose: 128 mg/dl (ref 70–140)
POTASSIUM: 3.9 meq/L (ref 3.5–5.1)
Sodium: 140 mEq/L (ref 136–145)
Total Bilirubin: 0.36 mg/dL (ref 0.20–1.20)
Total Protein: 7.2 g/dL (ref 6.4–8.3)

## 2016-12-25 MED ORDER — PACLITAXEL PROTEIN-BOUND CHEMO INJECTION 100 MG
65.0000 mg/m2 | Freq: Once | INTRAVENOUS | Status: AC
  Administered 2016-12-25: 125 mg via INTRAVENOUS
  Filled 2016-12-25: qty 25

## 2016-12-25 MED ORDER — ONDANSETRON HCL 4 MG/2ML IJ SOLN
INTRAMUSCULAR | Status: AC
  Filled 2016-12-25: qty 4

## 2016-12-25 MED ORDER — SODIUM CHLORIDE 0.9% FLUSH
10.0000 mL | INTRAVENOUS | Status: DC | PRN
  Administered 2016-12-25: 10 mL
  Filled 2016-12-25: qty 10

## 2016-12-25 MED ORDER — SODIUM CHLORIDE 0.9% FLUSH
10.0000 mL | INTRAVENOUS | Status: DC | PRN
  Administered 2016-12-25: 10 mL via INTRAVENOUS
  Filled 2016-12-25: qty 10

## 2016-12-25 MED ORDER — SODIUM CHLORIDE 0.9 % IV SOLN
Freq: Once | INTRAVENOUS | Status: AC
  Administered 2016-12-25: 13:00:00 via INTRAVENOUS

## 2016-12-25 MED ORDER — ONDANSETRON HCL 4 MG/2ML IJ SOLN
8.0000 mg | Freq: Once | INTRAMUSCULAR | Status: AC
  Administered 2016-12-25: 8 mg via INTRAVENOUS

## 2016-12-25 MED ORDER — HEPARIN SOD (PORK) LOCK FLUSH 100 UNIT/ML IV SOLN
500.0000 [IU] | Freq: Once | INTRAVENOUS | Status: AC | PRN
  Administered 2016-12-25: 500 [IU]
  Filled 2016-12-25: qty 5

## 2016-12-25 NOTE — Patient Instructions (Signed)
[facility] Discharge Instructions for Patients Receiving Chemotherapy  Today you received the following chemotherapy agents: Abraxane   To help prevent nausea and vomiting after your treatment, we encourage you to take your nausea medication as directed.    If you develop nausea and vomiting that is not controlled by your nausea medication, call the clinic.   BELOW ARE SYMPTOMS THAT SHOULD BE REPORTED IMMEDIATELY:  *FEVER GREATER THAN 100.5 F  *CHILLS WITH OR WITHOUT FEVER  NAUSEA AND VOMITING THAT IS NOT CONTROLLED WITH YOUR NAUSEA MEDICATION  *UNUSUAL SHORTNESS OF BREATH  *UNUSUAL BRUISING OR BLEEDING  TENDERNESS IN MOUTH AND THROAT WITH OR WITHOUT PRESENCE OF ULCERS  *URINARY PROBLEMS  *BOWEL PROBLEMS  UNUSUAL RASH Items with * indicate a potential emergency and should be followed up as soon as possible.  Feel free to call the clinic you have any questions or concerns. The clinic phone number is [phone].  Please show the CHEMO ALERT CARD at check-in to the Emergency Department and triage nurse.   

## 2016-12-25 NOTE — Assessment & Plan Note (Signed)
07/24/2016: Palpable right breast mass for 2 months UOQ at 10:00: 4.7 x 4.1 x 3.7 cm with enlarged axillary lymph node biopsy benign, breast mass biopsy grade 3 IDC ER 30% week PR negative HER-2 negative Ki-67 90% CT CAP and Bone scan: No mets  Treatment plan based on multidisciplinary tumor board: 1. Right mastectomy01/24/2018: IDC grade 3, 7 cm, margins negative, 1/17 nodes positive, ER weakly +30%, PR negative, HER-2 negative, Ki-67 90%, T3 N1 stage IIIa 2. Adjuvant chemotherapy with Adriamycin and Cytoxan dose dense 4 followed by Abraxane weekly 12 (Patient cannot state steroids due to diabetes and so we are not using Taxol) 3. Followed by adjuvant radiation therapy 4. Followed by adjuvant antiestrogen therapy -------------------------------------------------------------------------------------------------------------------------- Current treatment: Completed 4 cycles ofdose dense Adriamycin Cytoxan, today is cycle 6 Abraxane Echocardiogram 09/16/2016: EF 60-65 %  Chemotherapy toxicities: 1. Fatigue due to chemotherapy 2. nausea grade 1 3. 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 her symptoms are currently stable.  Return to clinic weekly for Abraxane andevery 2 weeks for follow-up with me

## 2016-12-25 NOTE — Patient Instructions (Signed)

## 2016-12-25 NOTE — Progress Notes (Signed)
Patient Care Team: Casey Ebbs, MD as PCP - General (Internal Medicine) Casey Kussmaul, MD as Consulting Physician (General Surgery) Casey Lose, MD as Consulting Physician (Hematology and Oncology) Casey Rudd, MD as Consulting Physician (Radiation Oncology) Casey Bison Charlestine Massed, NP as Nurse Practitioner (Hematology and Oncology)  DIAGNOSIS:  Encounter Diagnosis  Name Primary?  . Malignant neoplasm of upper-outer quadrant of right breast in female, estrogen receptor positive (Casey Olson)     SUMMARY OF ONCOLOGIC HISTORY:   Breast cancer of upper-outer quadrant of right female breast (Casey Olson)   07/24/2016 Initial Diagnosis    Palpable right breast mass for 2 months UOQ at 10:00: 4.7 x 4.1 x 3.7 cm with enlarged axillary lymph node biopsy benign, breast mass biopsy grade 3 IDC ER 30% week PR negative HER-2 negative Ki-67 90%      08/26/2016 Surgery    Right mastectomy: IDC grade 3, 7 cm, margins negative, 1/17 nodes positive, ER weakly +30%, PR negative, HER-2 negative, Ki-67 90%, T3 N1 stage IIIa      09/25/2016 -  Chemotherapy     Dose dense Adriamycin Cytoxan 4 followed y weelyAbraxane 12       11/23/2016 Genetic Testing    Results pending.  Genes tested include: APC, ATM, AXIN2, BARD1, BMPR1A, BRCA1, BRCA2, BRIP1, CDH1, CDKN2A (p14ARF), CDKN2A (p16INK4a), CHEK2, DICER1, EPCAM (Deletion/duplication testing only), GREM1 (promoter region deletion/duplication testing only), KIT, MEN1, MLH1, MSH2, MSH6, MUTYH, NBN, NF1, PALB2, PDGFRA, PMS2, POLD1, POLE, PTEN, RAD50, RAD51C, RAD51D, SDHB, SDHC, SDHD, SMAD4, SMARCA4. STK11, TP53, TSC1, TSC2, and VHL.  The following gene was evaluated for sequence changes only: SDHA and HOXB13 c.251G>A variant only.       Malignant neoplasm of upper-outer quadrant of right breast in female, estrogen receptor positive (Casey Olson)   08/05/2016 Initial Diagnosis    Malignant neoplasm of upper-outer quadrant of right breast in female, estrogen receptor positive  (Casey Olson)     12/03/2016 Genetic Testing    APC c.2213A>G and POLE c.1064A>G VUS found on the Common Hereditary cancer panel.  The Hereditary Gene Panel offered by [facility] includes sequencing and/or deletion duplication testing of the following 46 genes: APC, ATM, AXIN2, BARD1, BMPR1A, BRCA1, BRCA2, BRIP1, CDH1, CDKN2A (p14ARF), CDKN2A (p16INK4a), CHEK2, CTNNA1, DICER1, EPCAM (Deletion/duplication testing only), GREM1 (promoter region deletion/duplication testing only), KIT, MEN1, MLH1, MSH2, MSH3, MSH6, MUTYH, NBN, NF1, NHTL49mPALB2, PDGFRA, PMS2, POLD1, POLE, PTEN, RAD50, RAD51C, RAD51D, SDHB, SDHC, SDHD, SMAD4, SMARCA4. STK11, TP53, TSC1, TSC2, and VHL.  The following gene was evaluated for sequence changes only: SDHA and HOXB13 c.251G>A variant only.  The report date is Dec 03, 2016.        CHIEF COMPLIANT: Cycle 6 Abraxane  INTERVAL HISTORY: [name] a 66year old with above-mentioned history of right breast cancer currently on adjuvant chemotherapy. Today is cycle 6 of Abraxane. She reports to be feeling quite well. She denies any nausea vomiting. Neuropathy is of the tips of the fingers and is stable. Energy levels are also excellent.  REVIEW OF SYSTEMS:   Constitutional: Denies fevers, chills or abnormal weight loss Eyes: Denies blurriness of vision Ears, nose, mouth, throat, and face: Denies mucositis or sore throat Respiratory: Denies cough, dyspnea or wheezes Cardiovascular: Denies palpitation, chest discomfort Gastrointestinal:  Denies nausea, heartburn or change in bowel habits Skin: Denies abnormal skin rashes Lymphatics: Denies new lymphadenopathy or easy bruising Neurological: Neuropathy in the fingers Behavioral/Psych: Mood is stable, no new changes  Extremities: No lower extremity edema All other systems were reviewed with  the patient and are negative.  I have reviewed the past medical history, past surgical history, social history and family history with the patient  and they are unchanged from previous note.  ALLERGIES:  is allergic to percocet [oxycodone-acetaminophen].  MEDICATIONS:  Current Outpatient Prescriptions  Medication Sig Dispense Refill  . acetaminophen (TYLENOL) 325 MG tablet Take 650 mg by mouth every 6 (six) hours as needed for mild pain.    Marland Kitchen aspirin EC 81 MG tablet Take 81 mg by mouth daily.    . BD PEN NEEDLE NANO U/F 32G X 4 MM MISC USE TO INJECT INSULIN EVERY DAY (E11.65)  5  . Blood Glucose Monitoring Suppl (ONETOUCH VERIO FLEX SYSTEM) w/Device KIT USE TO TEST 3 TIMES DAILY DX E11.65  11  . Calcium Carbonate-Vitamin D (CALTRATE 600+D PO) Take 1 tablet by mouth 2 (two) times daily.    Marland Kitchen gabapentin (NEURONTIN) 100 MG capsule Take 1 capsule (100 mg total) by mouth 3 (three) times daily. 90 capsule 3  . Hyprom-Naphaz-Polysorb-Zn Sulf (CLEAR EYES COMPLETE OP) Apply 2 drops to eye 2 (two) times daily as needed (irritation).    Marland Kitchen LANTUS SOLOSTAR 100 UNIT/ML Solostar Pen INJECT 20 UNITS IN THE MORNING  5  . lidocaine-prilocaine (EMLA) cream Apply to affected area once 30 g 3  . loratadine (CLARITIN) 10 MG tablet Take 10 mg by mouth daily as needed for allergies or rhinitis.    Marland Kitchen LORazepam (ATIVAN) 0.5 MG tablet Take 1 tablet (0.5 mg total) by mouth every 6 (six) hours as needed (Nausea or vomiting). 30 tablet 0  . losartan (COZAAR) 100 MG tablet Take 100 mg by mouth daily.  2  . metFORMIN (GLUCOPHAGE) 1000 MG tablet Take 1,000 mg by mouth 2 (two) times daily.  5  . Multiple Vitamin (MULTIVITAMIN WITH MINERALS) TABS tablet Take 1 tablet by mouth daily.    . ondansetron (ZOFRAN) 8 MG tablet Take 1 tablet (8 mg total) by mouth 2 (two) times daily as needed. Start on the third day after chemotherapy. 30 tablet 1  . ONETOUCH DELICA LANCETS FINE MISC USE TO TEST 3 TIMES DAILY DX E11.65  11  . ONETOUCH VERIO test strip USE TO TEST 3 TIMES DAILY DX E11.65  11  . prochlorperazine (COMPAZINE) 10 MG tablet Take 1 tablet (10 mg total) by mouth every 6  (six) hours as needed (Nausea or vomiting). 30 tablet 1  . simvastatin (ZOCOR) 10 MG tablet Take 10 mg by mouth daily at 6 PM.   2  . thiamine (VITAMIN B-1) 100 MG tablet Take 100 mg by mouth daily.     No current facility-administered medications for this visit.    Facility-Administered Medications Ordered in Other Visits  Medication Dose Route Frequency Provider Last Rate Last Dose  . sodium chloride flush (NS) 0.9 % injection 10 mL  10 mL Intravenous PRN Casey Lose, MD   10 mL at 09/25/16 1112    PHYSICAL EXAMINATION: ECOG PERFORMANCE STATUS: 1 - Symptomatic but completely ambulatory  Vitals:   12/25/16 1150  BP: (!) 146/64  Pulse: 84  Resp: 18  Temp: 98.1 F (36.7 C)   Filed Weights   12/25/16 1150  Weight: 157 lb 1.6 oz (71.3 kg)    GENERAL:alert, no distress and comfortable SKIN: skin color, texture, turgor are normal, no rashes or significant lesions EYES: normal, Conjunctiva are pink and non-injected, sclera clear OROPHARYNX:no exudate, no erythema and lips, buccal mucosa, and tongue normal  NECK: supple, thyroid normal  size, non-tender, without nodularity LYMPH:  no palpable lymphadenopathy in the cervical, axillary or inguinal LUNGS: clear to auscultation and percussion with normal breathing effort HEART: regular rate & rhythm and no murmurs and no lower extremity edema ABDOMEN:abdomen soft, non-tender and normal bowel sounds MUSCULOSKELETAL:no cyanosis of digits and no clubbing  NEURO: Grade 2 peripheral neuropathy EXTREMITIES: No lower extremity edema   LABORATORY DATA:  I have reviewed the data as listed   Chemistry      Component Value Date/Time   NA 140 12/18/2016 1256   K 3.8 12/18/2016 1256   CL 101 08/21/2016 1333   CO2 24 12/18/2016 1256   BUN 10.7 12/18/2016 1256   CREATININE 0.7 12/18/2016 1256      Component Value Date/Time   CALCIUM 9.5 12/18/2016 1256   ALKPHOS 67 12/18/2016 1256   AST 14 12/18/2016 1256   ALT 16 12/18/2016 1256    BILITOT 0.34 12/18/2016 1256       Lab Results  Component Value Date   WBC 3.1 (L) 12/25/2016   HGB 10.2 (L) 12/25/2016   HCT 31.3 (L) 12/25/2016   MCV 90.2 12/25/2016   PLT 213 12/25/2016   NEUTROABS 1.6 12/25/2016    ASSESSMENT & PLAN:  Breast cancer of upper-outer quadrant of right female breast ([place]) 07/24/2016: Palpable right breast mass for 2 months UOQ at 10:00: 4.7 x 4.1 x 3.7 cm with enlarged axillary lymph node biopsy benign, breast mass biopsy grade 3 IDC ER 30% week PR negative HER-2 negative Ki-67 90% CT CAP and Bone scan: No mets  Treatment plan based on multidisciplinary tumor board: 1. Right mastectomy01/24/2018: IDC grade 3, 7 cm, margins negative, 1/17 nodes positive, ER weakly +30%, PR negative, HER-2 negative, Ki-67 90%, T3 N1 stage IIIa 2. Adjuvant chemotherapy with Adriamycin and Cytoxan dose dense 4 followed by Abraxane weekly 12 (Patient cannot state steroids due to diabetes and so we are not using Taxol) 3. Followed by adjuvant radiation therapy 4. Followed by adjuvant antiestrogen therapy -------------------------------------------------------------------------------------------------------------------------- Current treatment: Completed 4 cycles ofdose dense Adriamycin Cytoxan, today is cycle 6 Abraxane Echocardiogram 09/16/2016: EF 60-65 %  Chemotherapy toxicities: 1. Fatigue due to chemotherapy 2. nausea grade 1 3. Anemia: Being monitored 4. Chemotherapy-induced peripheral neuropathy: Grade 2 I reduced the dosage of Abraxane from week 2. her symptoms are currently stable.  Return to clinic weekly for Abraxane andevery 2 weeks for follow-up with me  I spent 25 minutes talking to the patient of which more than half was spent in counseling and coordination of care.  No orders of the defined types were placed in this encounter.  The patient has a good understanding of the overall plan. she agrees with it. she will call with any problems that  may develop before the next visit here.   Rulon Eisenmenger, MD 12/25/16

## 2017-01-01 ENCOUNTER — Ambulatory Visit (HOSPITAL_BASED_OUTPATIENT_CLINIC_OR_DEPARTMENT_OTHER): Payer: Medicare Other

## 2017-01-01 ENCOUNTER — Other Ambulatory Visit (HOSPITAL_BASED_OUTPATIENT_CLINIC_OR_DEPARTMENT_OTHER): Payer: Medicare Other

## 2017-01-01 ENCOUNTER — Ambulatory Visit: Payer: Medicare Other

## 2017-01-01 VITALS — BP 135/75 | HR 88 | Temp 98.4°F | Resp 18

## 2017-01-01 DIAGNOSIS — Z170 Estrogen receptor positive status [ER+]: Principal | ICD-10-CM

## 2017-01-01 DIAGNOSIS — Z95828 Presence of other vascular implants and grafts: Secondary | ICD-10-CM

## 2017-01-01 DIAGNOSIS — Z5111 Encounter for antineoplastic chemotherapy: Secondary | ICD-10-CM

## 2017-01-01 DIAGNOSIS — C50411 Malignant neoplasm of upper-outer quadrant of right female breast: Secondary | ICD-10-CM

## 2017-01-01 LAB — COMPREHENSIVE METABOLIC PANEL
ALT: 14 U/L (ref 0–55)
ANION GAP: 10 meq/L (ref 3–11)
AST: 14 U/L (ref 5–34)
Albumin: 3.9 g/dL (ref 3.5–5.0)
Alkaline Phosphatase: 72 U/L (ref 40–150)
BUN: 15 mg/dL (ref 7.0–26.0)
CALCIUM: 9.6 mg/dL (ref 8.4–10.4)
CO2: 24 mEq/L (ref 22–29)
CREATININE: 0.8 mg/dL (ref 0.6–1.1)
Chloride: 106 mEq/L (ref 98–109)
EGFR: 84 mL/min/{1.73_m2} — AB (ref >90)
Glucose: 111 mg/dl (ref 70–140)
POTASSIUM: 4 meq/L (ref 3.5–5.1)
Sodium: 141 mEq/L (ref 136–145)
Total Bilirubin: 0.32 mg/dL (ref 0.20–1.20)
Total Protein: 7.4 g/dL (ref 6.4–8.3)

## 2017-01-01 LAB — CBC WITH DIFFERENTIAL/PLATELET
BASO%: 0.8 % (ref 0.0–2.0)
BASOS ABS: 0 10*3/uL (ref 0.0–0.1)
EOS ABS: 0.3 10*3/uL (ref 0.0–0.5)
EOS%: 6.8 % (ref 0.0–7.0)
HEMATOCRIT: 33.3 % — AB (ref 34.8–46.6)
HGB: 10.9 g/dL — ABNORMAL LOW (ref 11.6–15.9)
LYMPH%: 14 % (ref 14.0–49.7)
MCH: 29.7 pg (ref 25.1–34.0)
MCHC: 32.8 g/dL (ref 31.5–36.0)
MCV: 90.8 fL (ref 79.5–101.0)
MONO#: 0.6 10*3/uL (ref 0.1–0.9)
MONO%: 13.6 % (ref 0.0–14.0)
NEUT#: 2.7 10*3/uL (ref 1.5–6.5)
NEUT%: 64.8 % (ref 38.4–76.8)
PLATELETS: 262 10*3/uL (ref 145–400)
RBC: 3.67 10*6/uL — ABNORMAL LOW (ref 3.70–5.45)
RDW: 16.6 % — ABNORMAL HIGH (ref 11.2–14.5)
WBC: 4.2 10*3/uL (ref 3.9–10.3)
lymph#: 0.6 10*3/uL — ABNORMAL LOW (ref 0.9–3.3)

## 2017-01-01 MED ORDER — ONDANSETRON HCL 4 MG/2ML IJ SOLN
INTRAMUSCULAR | Status: AC
  Filled 2017-01-01: qty 4

## 2017-01-01 MED ORDER — HEPARIN SOD (PORK) LOCK FLUSH 100 UNIT/ML IV SOLN
500.0000 [IU] | Freq: Once | INTRAVENOUS | Status: AC | PRN
  Administered 2017-01-01: 500 [IU]
  Filled 2017-01-01: qty 5

## 2017-01-01 MED ORDER — PACLITAXEL PROTEIN-BOUND CHEMO INJECTION 100 MG
65.0000 mg/m2 | Freq: Once | INTRAVENOUS | Status: AC
  Administered 2017-01-01: 125 mg via INTRAVENOUS
  Filled 2017-01-01: qty 25

## 2017-01-01 MED ORDER — ONDANSETRON HCL 4 MG/2ML IJ SOLN
8.0000 mg | Freq: Once | INTRAMUSCULAR | Status: AC
Start: 2017-01-01 — End: 2017-01-01
  Administered 2017-01-01: 8 mg via INTRAVENOUS

## 2017-01-01 MED ORDER — SODIUM CHLORIDE 0.9% FLUSH
10.0000 mL | INTRAVENOUS | Status: DC | PRN
  Administered 2017-01-01: 10 mL via INTRAVENOUS
  Filled 2017-01-01: qty 10

## 2017-01-01 MED ORDER — SODIUM CHLORIDE 0.9% FLUSH
10.0000 mL | INTRAVENOUS | Status: DC | PRN
  Administered 2017-01-01: 10 mL
  Filled 2017-01-01: qty 10

## 2017-01-01 MED ORDER — SODIUM CHLORIDE 0.9 % IV SOLN
Freq: Once | INTRAVENOUS | Status: AC
  Administered 2017-01-01: 14:00:00 via INTRAVENOUS

## 2017-01-01 NOTE — Patient Instructions (Signed)
[facility] Discharge Instructions for Patients Receiving Chemotherapy  Today you received the following chemotherapy agents: Abraxane   To help prevent nausea and vomiting after your treatment, we encourage you to take your nausea medication as directed.    If you develop nausea and vomiting that is not controlled by your nausea medication, call the clinic.   BELOW ARE SYMPTOMS THAT SHOULD BE REPORTED IMMEDIATELY:  *FEVER GREATER THAN 100.5 F  *CHILLS WITH OR WITHOUT FEVER  NAUSEA AND VOMITING THAT IS NOT CONTROLLED WITH YOUR NAUSEA MEDICATION  *UNUSUAL SHORTNESS OF BREATH  *UNUSUAL BRUISING OR BLEEDING  TENDERNESS IN MOUTH AND THROAT WITH OR WITHOUT PRESENCE OF ULCERS  *URINARY PROBLEMS  *BOWEL PROBLEMS  UNUSUAL RASH Items with * indicate a potential emergency and should be followed up as soon as possible.  Feel free to call the clinic you have any questions or concerns. The clinic phone number is [phone].  Please show the CHEMO ALERT CARD at check-in to the Emergency Department and triage nurse.   

## 2017-01-01 NOTE — Patient Instructions (Signed)

## 2017-01-08 ENCOUNTER — Ambulatory Visit (HOSPITAL_BASED_OUTPATIENT_CLINIC_OR_DEPARTMENT_OTHER): Payer: Medicare Other | Admitting: Hematology and Oncology

## 2017-01-08 ENCOUNTER — Other Ambulatory Visit (HOSPITAL_BASED_OUTPATIENT_CLINIC_OR_DEPARTMENT_OTHER): Payer: Medicare Other

## 2017-01-08 ENCOUNTER — Encounter: Payer: Self-pay | Admitting: Hematology and Oncology

## 2017-01-08 ENCOUNTER — Ambulatory Visit (HOSPITAL_BASED_OUTPATIENT_CLINIC_OR_DEPARTMENT_OTHER): Payer: Medicare Other

## 2017-01-08 ENCOUNTER — Ambulatory Visit: Payer: Medicare Other

## 2017-01-08 DIAGNOSIS — G629 Polyneuropathy, unspecified: Secondary | ICD-10-CM

## 2017-01-08 DIAGNOSIS — C50411 Malignant neoplasm of upper-outer quadrant of right female breast: Secondary | ICD-10-CM

## 2017-01-08 DIAGNOSIS — Z5111 Encounter for antineoplastic chemotherapy: Secondary | ICD-10-CM | POA: Diagnosis not present

## 2017-01-08 DIAGNOSIS — D649 Anemia, unspecified: Secondary | ICD-10-CM

## 2017-01-08 DIAGNOSIS — Z170 Estrogen receptor positive status [ER+]: Secondary | ICD-10-CM | POA: Diagnosis not present

## 2017-01-08 LAB — COMPREHENSIVE METABOLIC PANEL
ALK PHOS: 75 U/L (ref 40–150)
ALT: 14 U/L (ref 0–55)
AST: 11 U/L (ref 5–34)
Albumin: 3.8 g/dL (ref 3.5–5.0)
Anion Gap: 11 mEq/L (ref 3–11)
BUN: 8.9 mg/dL (ref 7.0–26.0)
CO2: 26 meq/L (ref 22–29)
Calcium: 9.7 mg/dL (ref 8.4–10.4)
Chloride: 107 mEq/L (ref 98–109)
Creatinine: 0.7 mg/dL (ref 0.6–1.1)
GLUCOSE: 116 mg/dL (ref 70–140)
POTASSIUM: 4 meq/L (ref 3.5–5.1)
Sodium: 143 mEq/L (ref 136–145)
Total Bilirubin: 0.39 mg/dL (ref 0.20–1.20)
Total Protein: 7.2 g/dL (ref 6.4–8.3)

## 2017-01-08 LAB — CBC WITH DIFFERENTIAL/PLATELET
BASO%: 0.6 % (ref 0.0–2.0)
BASOS ABS: 0 10*3/uL (ref 0.0–0.1)
EOS ABS: 0.3 10*3/uL (ref 0.0–0.5)
EOS%: 8.1 % — AB (ref 0.0–7.0)
HCT: 33.4 % — ABNORMAL LOW (ref 34.8–46.6)
HGB: 11 g/dL — ABNORMAL LOW (ref 11.6–15.9)
LYMPH%: 14.9 % (ref 14.0–49.7)
MCH: 29.6 pg (ref 25.1–34.0)
MCHC: 33.1 g/dL (ref 31.5–36.0)
MCV: 89.5 fL (ref 79.5–101.0)
MONO#: 0.5 10*3/uL (ref 0.1–0.9)
MONO%: 14.6 % — AB (ref 0.0–14.0)
NEUT#: 2.3 10*3/uL (ref 1.5–6.5)
NEUT%: 61.8 % (ref 38.4–76.8)
Platelets: 255 10*3/uL (ref 145–400)
RBC: 3.73 10*6/uL (ref 3.70–5.45)
RDW: 15.8 % — ABNORMAL HIGH (ref 11.2–14.5)
WBC: 3.6 10*3/uL — ABNORMAL LOW (ref 3.9–10.3)
lymph#: 0.5 10*3/uL — ABNORMAL LOW (ref 0.9–3.3)

## 2017-01-08 MED ORDER — ONDANSETRON HCL 4 MG/2ML IJ SOLN
8.0000 mg | Freq: Once | INTRAMUSCULAR | Status: AC
  Administered 2017-01-08: 8 mg via INTRAVENOUS

## 2017-01-08 MED ORDER — ONDANSETRON HCL 4 MG/2ML IJ SOLN
INTRAMUSCULAR | Status: AC
  Filled 2017-01-08: qty 4

## 2017-01-08 MED ORDER — PACLITAXEL PROTEIN-BOUND CHEMO INJECTION 100 MG
65.0000 mg/m2 | Freq: Once | INTRAVENOUS | Status: AC
  Administered 2017-01-08: 125 mg via INTRAVENOUS
  Filled 2017-01-08: qty 25

## 2017-01-08 MED ORDER — HEPARIN SOD (PORK) LOCK FLUSH 100 UNIT/ML IV SOLN
500.0000 [IU] | Freq: Once | INTRAVENOUS | Status: AC | PRN
  Administered 2017-01-08: 500 [IU]
  Filled 2017-01-08: qty 5

## 2017-01-08 MED ORDER — SODIUM CHLORIDE 0.9% FLUSH
10.0000 mL | INTRAVENOUS | Status: DC | PRN
  Administered 2017-01-08: 10 mL
  Filled 2017-01-08: qty 10

## 2017-01-08 MED ORDER — SODIUM CHLORIDE 0.9 % IV SOLN
Freq: Once | INTRAVENOUS | Status: AC
  Administered 2017-01-08: 10:00:00 via INTRAVENOUS

## 2017-01-08 NOTE — Assessment & Plan Note (Signed)
07/24/2016: Palpable right breast mass for 2 months UOQ at 10:00: 4.7 x 4.1 x 3.7 cm with enlarged axillary lymph node biopsy benign, breast mass biopsy grade 3 IDC ER 30% week PR negative HER-2 negative Ki-67 90% CT CAP and Bone scan: No mets  Treatment plan based on multidisciplinary tumor board: 1. Right mastectomy01/24/2018: IDC grade 3, 7 cm, margins negative, 1/17 nodes positive, ER weakly +30%, PR negative, HER-2 negative, Ki-67 90%, T3 N1 stage IIIa 2. Adjuvant chemotherapy with Adriamycin and Cytoxan dose dense 4 followed by Abraxane weekly 12 (Patient cannot state steroids due to diabetes and so we are not using Taxol) 3. Followed by adjuvant radiation therapy 4. Followed by adjuvant antiestrogen therapy -------------------------------------------------------------------------------------------------------------------------- Current treatment: Completed 4 cycles ofdose dense Adriamycin Cytoxan, today is cycle 8 Abraxane Echocardiogram 09/16/2016: EF 60-65 %  Chemotherapy toxicities: 1. Fatigue due to chemotherapy 2. nausea grade 1 3. Anemia: Being monitored 4. Chemotherapy-induced peripheral neuropathy: Grade 2 I reduced the dosage of Abraxane from week 2. her symptoms are currently stable.  Return to clinic weekly for Abraxane andevery 2 weeks for follow-up with me

## 2017-01-08 NOTE — Progress Notes (Signed)
Patient Care Team: Nolene Ebbs, MD as PCP - General (Internal Medicine) Jovita Kussmaul, MD as Consulting Physician (General Surgery) Nicholas Lose, MD as Consulting Physician (Hematology and Oncology) Kyung Rudd, MD as Consulting Physician (Radiation Oncology) Delice Bison Charlestine Massed, NP as Nurse Practitioner (Hematology and Oncology)  DIAGNOSIS:  Encounter Diagnosis  Name Primary?  . Malignant neoplasm of upper-outer quadrant of right breast in female, estrogen receptor positive ([place])     SUMMARY OF ONCOLOGIC HISTORY:   Breast cancer of upper-outer quadrant of right female breast ([place])   07/24/2016 Initial Diagnosis    Palpable right breast mass for 2 months UOQ at 10:00: 4.7 x 4.1 x 3.7 cm with enlarged axillary lymph node biopsy benign, breast mass biopsy grade 3 IDC ER 30% week PR negative HER-2 negative Ki-67 90%      08/26/2016 Surgery    Right mastectomy: IDC grade 3, 7 cm, margins negative, 1/17 nodes positive, ER weakly +30%, PR negative, HER-2 negative, Ki-67 90%, T3 N1 stage IIIa      09/25/2016 -  Chemotherapy     Dose dense Adriamycin Cytoxan 4 followed y weelyAbraxane 12       11/23/2016 Genetic Testing    Results pending.  Genes tested include: APC, ATM, AXIN2, BARD1, BMPR1A, BRCA1, BRCA2, BRIP1, CDH1, CDKN2A (p14ARF), CDKN2A (p16INK4a), CHEK2, DICER1, EPCAM (Deletion/duplication testing only), GREM1 (promoter region deletion/duplication testing only), KIT, MEN1, MLH1, MSH2, MSH6, MUTYH, NBN, NF1, PALB2, PDGFRA, PMS2, POLD1, POLE, PTEN, RAD50, RAD51C, RAD51D, SDHB, SDHC, SDHD, SMAD4, SMARCA4. STK11, TP53, TSC1, TSC2, and VHL.  The following gene was evaluated for sequence changes only: SDHA and HOXB13 c.251G>A variant only.       Malignant neoplasm of upper-outer quadrant of right breast in female, estrogen receptor positive ([place])   08/05/2016 Initial Diagnosis    Malignant neoplasm of upper-outer quadrant of right breast in female, estrogen receptor positive  ([place])     12/03/2016 Genetic Testing    APC c.2213A>G and POLE c.1064A>G VUS found on the Common Hereditary cancer panel.  The Hereditary Gene Panel offered by [facility] includes sequencing and/or deletion duplication testing of the following 46 genes: APC, ATM, AXIN2, BARD1, BMPR1A, BRCA1, BRCA2, BRIP1, CDH1, CDKN2A (p14ARF), CDKN2A (p16INK4a), CHEK2, CTNNA1, DICER1, EPCAM (Deletion/duplication testing only), GREM1 (promoter region deletion/duplication testing only), KIT, MEN1, MLH1, MSH2, MSH3, MSH6, MUTYH, NBN, NF1, NHTL22mPALB2, PDGFRA, PMS2, POLD1, POLE, PTEN, RAD50, RAD51C, RAD51D, SDHB, SDHC, SDHD, SMAD4, SMARCA4. STK11, TP53, TSC1, TSC2, and VHL.  The following gene was evaluated for sequence changes only: SDHA and HOXB13 c.251G>A variant only.  The report date is Dec 03, 2016.        CHIEF COMPLIANT: Cycle 8 Abraxane  INTERVAL HISTORY: WFatim Vanderschaafis a 66year old with above-mentioned history of right breast cancer underwent mastectomy and is currently on adjuvant chemotherapy and today is cycle 8 of Abraxane. She overall she's tolerating it fairly well. She's had intermittent diarrhea alternating with constipation. She denies any nausea or vomiting. Denies any mouth sores. She has neuropathy which has remained stable and hasn't gotten any worse. It's mostly of the tips of the fingers and tips of the toes.   REVIEW OF SYSTEMS:   Constitutional: Denies fevers, chills or abnormal weight loss Eyes: Denies blurriness of vision Ears, nose, mouth, throat, and face: Denies mucositis or sore throat Respiratory: Denies cough, dyspnea or wheezes Cardiovascular: Denies palpitation, chest discomfort Gastrointestinal:  Denies nausea, heartburn or change in bowel habits Skin: Denies abnormal skin rashes Lymphatics: Denies new lymphadenopathy  or easy bruising Neurological Neuropathy in fingers and toes  Behavioral/Psych: Mood is stable, no new changes  Extremities: No lower extremity edema  All  other systems were reviewed with the patient and are negative.  I have reviewed the past medical history, past surgical history, social history and family history with the patient and they are unchanged from previous note.  ALLERGIES:  is allergic to percocet [oxycodone-acetaminophen].  MEDICATIONS:  Current Outpatient Prescriptions  Medication Sig Dispense Refill  . acetaminophen (TYLENOL) 325 MG tablet Take 650 mg by mouth every 6 (six) hours as needed for mild pain.    Marland Kitchen aspirin EC 81 MG tablet Take 81 mg by mouth daily.    . BD PEN NEEDLE NANO U/F 32G X 4 MM MISC USE TO INJECT INSULIN EVERY DAY (E11.65)  5  . Blood Glucose Monitoring Suppl (ONETOUCH VERIO FLEX SYSTEM) w/Device KIT USE TO TEST 3 TIMES DAILY DX E11.65  11  . Calcium Carbonate-Vitamin D (CALTRATE 600+D PO) Take 1 tablet by mouth 2 (two) times daily.    Marland Kitchen gabapentin (NEURONTIN) 100 MG capsule Take 1 capsule (100 mg total) by mouth 3 (three) times daily. 90 capsule 3  . Hyprom-Naphaz-Polysorb-Zn Sulf (CLEAR EYES COMPLETE OP) Apply 2 drops to eye 2 (two) times daily as needed (irritation).    Marland Kitchen LANTUS SOLOSTAR 100 UNIT/ML Solostar Pen INJECT 20 UNITS IN THE MORNING  5  . lidocaine-prilocaine (EMLA) cream Apply to affected area once 30 g 3  . loratadine (CLARITIN) 10 MG tablet Take 10 mg by mouth daily as needed for allergies or rhinitis.    Marland Kitchen LORazepam (ATIVAN) 0.5 MG tablet Take 1 tablet (0.5 mg total) by mouth every 6 (six) hours as needed (Nausea or vomiting). 30 tablet 0  . losartan (COZAAR) 100 MG tablet Take 100 mg by mouth daily.  2  . metFORMIN (GLUCOPHAGE) 1000 MG tablet Take 1,000 mg by mouth 2 (two) times daily.  5  . Multiple Vitamin (MULTIVITAMIN WITH MINERALS) TABS tablet Take 1 tablet by mouth daily.    . ondansetron (ZOFRAN) 8 MG tablet Take 1 tablet (8 mg total) by mouth 2 (two) times daily as needed. Start on the third day after chemotherapy. 30 tablet 1  . ONETOUCH DELICA LANCETS FINE MISC USE TO TEST 3 TIMES  DAILY DX E11.65  11  . ONETOUCH VERIO test strip USE TO TEST 3 TIMES DAILY DX E11.65  11  . prochlorperazine (COMPAZINE) 10 MG tablet Take 1 tablet (10 mg total) by mouth every 6 (six) hours as needed (Nausea or vomiting). 30 tablet 1  . simvastatin (ZOCOR) 10 MG tablet Take 10 mg by mouth daily at 6 PM.   2  . thiamine (VITAMIN B-1) 100 MG tablet Take 100 mg by mouth daily.     No current facility-administered medications for this visit.    Facility-Administered Medications Ordered in Other Visits  Medication Dose Route Frequency Provider Last Rate Last Dose  . sodium chloride flush (NS) 0.9 % injection 10 mL  10 mL Intravenous PRN Nicholas Lose, MD   10 mL at 09/25/16 1112    PHYSICAL EXAMINATION: ECOG PERFORMANCE STATUS: 1 - Symptomatic but completely ambulatory  Vitals:   01/08/17 0904  BP: (!) 152/70  Pulse: 84  Resp: 18  Temp: 98.4 F (36.9 C)   Filed Weights   01/08/17 0904  Weight: 156 lb 9.6 oz (71 kg)    GENERAL:alert, no distress and comfortable SKIN: skin color, texture, turgor are normal,  no rashes or significant lesions EYES: normal, Conjunctiva are pink and non-injected, sclera clear OROPHARYNX:no exudate, no erythema and lips, buccal mucosa, and tongue normal  NECK: supple, thyroid normal size, non-tender, without nodularity LYMPH:  no palpable lymphadenopathy in the cervical, axillary or inguinal LUNGS: clear to auscultation and percussion with normal breathing effort HEART: regular rate & rhythm and no murmurs and no lower extremity edema ABDOMEN:abdomen soft, non-tender and normal bowel sounds MUSCULOSKELETAL:no cyanosis of digits and no clubbing  NEURO: Neuropathy grade 2 EXTREMITIES: No lower extremity edema   LABORATORY DATA:  I have reviewed the data as listed   Chemistry      Component Value Date/Time   NA 141 01/01/2017 1258   K 4.0 01/01/2017 1258   CL 101 08/21/2016 1333   CO2 24 01/01/2017 1258   BUN 15.0 01/01/2017 1258   CREATININE  0.8 01/01/2017 1258      Component Value Date/Time   CALCIUM 9.6 01/01/2017 1258   ALKPHOS 72 01/01/2017 1258   AST 14 01/01/2017 1258   ALT 14 01/01/2017 1258   BILITOT 0.32 01/01/2017 1258       Lab Results  Component Value Date   WBC 3.6 (L) 01/08/2017   HGB 11.0 (L) 01/08/2017   HCT 33.4 (L) 01/08/2017   MCV 89.5 01/08/2017   PLT 255 01/08/2017   NEUTROABS 2.3 01/08/2017    ASSESSMENT & PLAN:  Breast cancer of upper-outer quadrant of right female breast ([place]) 07/24/2016: Palpable right breast mass for 2 months UOQ at 10:00: 4.7 x 4.1 x 3.7 cm with enlarged axillary lymph node biopsy benign, breast mass biopsy grade 3 IDC ER 30% week PR negative HER-2 negative Ki-67 90% CT CAP and Bone scan: No mets  Treatment plan based on multidisciplinary tumor board: 1. Right mastectomy01/24/2018: IDC grade 3, 7 cm, margins negative, 1/17 nodes positive, ER weakly +30%, PR negative, HER-2 negative, Ki-67 90%, T3 N1 stage IIIa 2. Adjuvant chemotherapy with Adriamycin and Cytoxan dose dense 4 followed by Abraxane weekly 12 (Patient cannot state steroids due to diabetes and so we are not using Taxol) 3. Followed by adjuvant radiation therapy 4. Followed by adjuvant antiestrogen therapy -------------------------------------------------------------------------------------------------------------------------- Current treatment: Completed 4 cycles ofdose dense Adriamycin Cytoxan, today is cycle 8 Abraxane Echocardiogram 09/16/2016: EF 60-65 %  Chemotherapy toxicities: 1. Fatigue due to chemotherapy 2. nausea grade 1 3. Anemia: Being monitored 4. 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 Symptoms are stable  Return to clinic weekly for Abraxane andevery 2 weeks for follow-up with me  I spent 25 minutes talking to the patient of which more than half was spent in counseling and coordination of care.  No orders of the defined types were placed in  this encounter.  The patient has a good understanding of the overall plan. she agrees with it. she will call with any problems that may develop before the next visit here.   Rulon Eisenmenger, MD 01/08/17

## 2017-01-08 NOTE — Patient Instructions (Signed)
Implanted Port Home Guide An implanted port is a type of central line that is placed under the skin. Central lines are used to provide IV access when treatment or nutrition needs to be given through a person's veins. Implanted ports are used for long-term IV access. An implanted port may be placed because:  You need IV medicine that would be irritating to the small veins in your hands or arms.  You need long-term IV medicines, such as antibiotics.  You need IV nutrition for a long period.  You need frequent blood draws for lab tests.  You need dialysis.  Implanted ports are usually placed in the chest area, but they can also be placed in the upper arm, the abdomen, or the leg. An implanted port has two main parts:  Reservoir. The reservoir is round and will appear as a small, raised area under your skin. The reservoir is the part where a needle is inserted to give medicines or draw blood.  Catheter. The catheter is a thin, flexible tube that extends from the reservoir. The catheter is placed into a large vein. Medicine that is inserted into the reservoir goes into the catheter and then into the vein.  How will I care for my incision site? Do not get the incision site wet. Bathe or shower as directed by your health care provider. How is my port accessed? Special steps must be taken to access the port:  Before the port is accessed, a numbing cream can be placed on the skin. This helps numb the skin over the port site.  Your health care provider uses a sterile technique to access the port. ? Your health care provider must put on a mask and sterile gloves. ? The skin over your port is cleaned carefully with an antiseptic and allowed to dry. ? The port is gently pinched between sterile gloves, and a needle is inserted into the port.  Only "non-coring" port needles should be used to access the port. Once the port is accessed, a blood return should be checked. This helps ensure that the port  is in the vein and is not clogged.  If your port needs to remain accessed for a constant infusion, a clear (transparent) bandage will be placed over the needle site. The bandage and needle will need to be changed every week, or as directed by your health care provider.  Keep the bandage covering the needle clean and dry. Do not get it wet. Follow your health care provider's instructions on how to take a shower or bath while the port is accessed.  If your port does not need to stay accessed, no bandage is needed over the port.  What is flushing? Flushing helps keep the port from getting clogged. Follow your health care provider's instructions on how and when to flush the port. Ports are usually flushed with saline solution or a medicine called heparin. The need for flushing will depend on how the port is used.  If the port is used for intermittent medicines or blood draws, the port will need to be flushed: ? After medicines have been given. ? After blood has been drawn. ? As part of routine maintenance.  If a constant infusion is running, the port may not need to be flushed.  How long will my port stay implanted? The port can stay in for as long as your health care provider thinks it is needed. When it is time for the port to come out, surgery will be   done to remove it. The procedure is similar to the one performed when the port was put in. When should I seek immediate medical care? When you have an implanted port, you should seek immediate medical care if:  You notice a bad smell coming from the incision site.  You have swelling, redness, or drainage at the incision site.  You have more swelling or pain at the port site or the surrounding area.  You have a fever that is not controlled with medicine.  This information is not intended to replace advice given to you by your health care provider. Make sure you discuss any questions you have with your health care provider. Document  Released: 07/20/2005 Document Revised: 12/26/2015 Document Reviewed: 03/27/2013 Elsevier Interactive Patient Education  2017 [employer] Inc.  

## 2017-01-08 NOTE — Patient Instructions (Signed)
[facility] Discharge Instructions for Patients Receiving Chemotherapy  Today you received the following chemotherapy agents: Abraxane   To help prevent nausea and vomiting after your treatment, we encourage you to take your nausea medication as directed.    If you develop nausea and vomiting that is not controlled by your nausea medication, call the clinic.   BELOW ARE SYMPTOMS THAT SHOULD BE REPORTED IMMEDIATELY:  *FEVER GREATER THAN 100.5 F  *CHILLS WITH OR WITHOUT FEVER  NAUSEA AND VOMITING THAT IS NOT CONTROLLED WITH YOUR NAUSEA MEDICATION  *UNUSUAL SHORTNESS OF BREATH  *UNUSUAL BRUISING OR BLEEDING  TENDERNESS IN MOUTH AND THROAT WITH OR WITHOUT PRESENCE OF ULCERS  *URINARY PROBLEMS  *BOWEL PROBLEMS  UNUSUAL RASH Items with * indicate a potential emergency and should be followed up as soon as possible.  Feel free to call the clinic you have any questions or concerns. The clinic phone number is [phone].  Please show the CHEMO ALERT CARD at check-in to the Emergency Department and triage nurse.   

## 2017-01-11 ENCOUNTER — Other Ambulatory Visit: Payer: Self-pay

## 2017-01-11 MED ORDER — GABAPENTIN 100 MG PO CAPS: 100.0000 mg | ORAL_CAPSULE | Freq: Three times a day (TID) | ORAL | 3 refills | Status: DC

## 2017-01-15 ENCOUNTER — Other Ambulatory Visit (HOSPITAL_BASED_OUTPATIENT_CLINIC_OR_DEPARTMENT_OTHER): Payer: Medicare Other

## 2017-01-15 ENCOUNTER — Ambulatory Visit (HOSPITAL_BASED_OUTPATIENT_CLINIC_OR_DEPARTMENT_OTHER): Payer: Medicare Other

## 2017-01-15 ENCOUNTER — Ambulatory Visit: Payer: Medicare Other

## 2017-01-15 VITALS — BP 139/64 | HR 74 | Temp 98.3°F | Resp 18

## 2017-01-15 DIAGNOSIS — Z170 Estrogen receptor positive status [ER+]: Principal | ICD-10-CM

## 2017-01-15 DIAGNOSIS — C50411 Malignant neoplasm of upper-outer quadrant of right female breast: Secondary | ICD-10-CM

## 2017-01-15 DIAGNOSIS — Z95828 Presence of other vascular implants and grafts: Secondary | ICD-10-CM

## 2017-01-15 DIAGNOSIS — Z5111 Encounter for antineoplastic chemotherapy: Secondary | ICD-10-CM | POA: Diagnosis not present

## 2017-01-15 LAB — CBC WITH DIFFERENTIAL/PLATELET
BASO%: 0.8 % (ref 0.0–2.0)
BASOS ABS: 0 10*3/uL (ref 0.0–0.1)
EOS ABS: 0.4 10*3/uL (ref 0.0–0.5)
EOS%: 11.3 % — ABNORMAL HIGH (ref 0.0–7.0)
HEMATOCRIT: 32 % — AB (ref 34.8–46.6)
HEMOGLOBIN: 10.5 g/dL — AB (ref 11.6–15.9)
LYMPH#: 0.7 10*3/uL — AB (ref 0.9–3.3)
LYMPH%: 17.6 % (ref 14.0–49.7)
MCH: 29.4 pg (ref 25.1–34.0)
MCHC: 32.7 g/dL (ref 31.5–36.0)
MCV: 89.9 fL (ref 79.5–101.0)
MONO#: 0.6 10*3/uL (ref 0.1–0.9)
MONO%: 15.1 % — ABNORMAL HIGH (ref 0.0–14.0)
NEUT#: 2.1 10*3/uL (ref 1.5–6.5)
NEUT%: 55.2 % (ref 38.4–76.8)
Platelets: 249 10*3/uL (ref 145–400)
RBC: 3.56 10*6/uL — ABNORMAL LOW (ref 3.70–5.45)
RDW: 15.6 % — AB (ref 11.2–14.5)
WBC: 3.8 10*3/uL — AB (ref 3.9–10.3)

## 2017-01-15 LAB — COMPREHENSIVE METABOLIC PANEL
ALK PHOS: 72 U/L (ref 40–150)
ALT: 12 U/L (ref 0–55)
AST: 13 U/L (ref 5–34)
Albumin: 3.7 g/dL (ref 3.5–5.0)
Anion Gap: 8 mEq/L (ref 3–11)
BUN: 9.6 mg/dL (ref 7.0–26.0)
CALCIUM: 9.4 mg/dL (ref 8.4–10.4)
CO2: 24 mEq/L (ref 22–29)
Chloride: 106 mEq/L (ref 98–109)
Creatinine: 0.7 mg/dL (ref 0.6–1.1)
GLUCOSE: 101 mg/dL (ref 70–140)
POTASSIUM: 4 meq/L (ref 3.5–5.1)
SODIUM: 139 meq/L (ref 136–145)
TOTAL PROTEIN: 7.1 g/dL (ref 6.4–8.3)
Total Bilirubin: 0.29 mg/dL (ref 0.20–1.20)

## 2017-01-15 MED ORDER — HEPARIN SOD (PORK) LOCK FLUSH 100 UNIT/ML IV SOLN
500.0000 [IU] | Freq: Once | INTRAVENOUS | Status: AC | PRN
Start: 2017-01-15 — End: 2017-01-15
  Administered 2017-01-15: 500 [IU]
  Filled 2017-01-15: qty 5

## 2017-01-15 MED ORDER — SODIUM CHLORIDE 0.9% FLUSH
10.0000 mL | INTRAVENOUS | Status: DC | PRN
  Administered 2017-01-15: 10 mL
  Filled 2017-01-15: qty 10

## 2017-01-15 MED ORDER — ONDANSETRON HCL 4 MG/2ML IJ SOLN
INTRAMUSCULAR | Status: AC
  Filled 2017-01-15: qty 4

## 2017-01-15 MED ORDER — ONDANSETRON HCL 4 MG/2ML IJ SOLN
8.0000 mg | Freq: Once | INTRAMUSCULAR | Status: AC
  Administered 2017-01-15: 8 mg via INTRAVENOUS

## 2017-01-15 MED ORDER — SODIUM CHLORIDE 0.9% FLUSH
10.0000 mL | INTRAVENOUS | Status: DC | PRN
  Administered 2017-01-15: 10 mL via INTRAVENOUS
  Filled 2017-01-15: qty 10

## 2017-01-15 MED ORDER — SODIUM CHLORIDE 0.9 % IV SOLN
Freq: Once | INTRAVENOUS | Status: AC
  Administered 2017-01-15: 13:00:00 via INTRAVENOUS

## 2017-01-15 MED ORDER — PACLITAXEL PROTEIN-BOUND CHEMO INJECTION 100 MG
65.0000 mg/m2 | Freq: Once | INTRAVENOUS | Status: AC
  Administered 2017-01-15: 125 mg via INTRAVENOUS
  Filled 2017-01-15: qty 25

## 2017-01-15 NOTE — Patient Instructions (Signed)
[facility] Discharge Instructions for Patients Receiving Chemotherapy  Today you received the following chemotherapy agents: Abraxane   To help prevent nausea and vomiting after your treatment, we encourage you to take your nausea medication as directed.    If you develop nausea and vomiting that is not controlled by your nausea medication, call the clinic.   BELOW ARE SYMPTOMS THAT SHOULD BE REPORTED IMMEDIATELY:  *FEVER GREATER THAN 100.5 F  *CHILLS WITH OR WITHOUT FEVER  NAUSEA AND VOMITING THAT IS NOT CONTROLLED WITH YOUR NAUSEA MEDICATION  *UNUSUAL SHORTNESS OF BREATH  *UNUSUAL BRUISING OR BLEEDING  TENDERNESS IN MOUTH AND THROAT WITH OR WITHOUT PRESENCE OF ULCERS  *URINARY PROBLEMS  *BOWEL PROBLEMS  UNUSUAL RASH Items with * indicate a potential emergency and should be followed up as soon as possible.  Feel free to call the clinic you have any questions or concerns. The clinic phone number is [phone].  Please show the CHEMO ALERT CARD at check-in to the Emergency Department and triage nurse.   

## 2017-01-22 ENCOUNTER — Other Ambulatory Visit (HOSPITAL_BASED_OUTPATIENT_CLINIC_OR_DEPARTMENT_OTHER): Payer: Medicare Other

## 2017-01-22 ENCOUNTER — Ambulatory Visit: Payer: Medicare Other

## 2017-01-22 ENCOUNTER — Ambulatory Visit (HOSPITAL_BASED_OUTPATIENT_CLINIC_OR_DEPARTMENT_OTHER): Payer: Medicare Other

## 2017-01-22 VITALS — BP 121/69 | HR 77 | Temp 98.6°F | Resp 18

## 2017-01-22 DIAGNOSIS — Z95828 Presence of other vascular implants and grafts: Secondary | ICD-10-CM

## 2017-01-22 DIAGNOSIS — C50411 Malignant neoplasm of upper-outer quadrant of right female breast: Secondary | ICD-10-CM | POA: Diagnosis not present

## 2017-01-22 DIAGNOSIS — Z170 Estrogen receptor positive status [ER+]: Secondary | ICD-10-CM

## 2017-01-22 DIAGNOSIS — Z5111 Encounter for antineoplastic chemotherapy: Secondary | ICD-10-CM | POA: Diagnosis not present

## 2017-01-22 LAB — CBC WITH DIFFERENTIAL/PLATELET
BASO%: 0.8 % (ref 0.0–2.0)
Basophils Absolute: 0 10*3/uL (ref 0.0–0.1)
EOS%: 7.4 % — AB (ref 0.0–7.0)
Eosinophils Absolute: 0.2 10*3/uL (ref 0.0–0.5)
HCT: 32.7 % — ABNORMAL LOW (ref 34.8–46.6)
HEMOGLOBIN: 10.7 g/dL — AB (ref 11.6–15.9)
LYMPH%: 19.5 % (ref 14.0–49.7)
MCH: 28.9 pg (ref 25.1–34.0)
MCHC: 32.6 g/dL (ref 31.5–36.0)
MCV: 88.5 fL (ref 79.5–101.0)
MONO#: 0.6 10*3/uL (ref 0.1–0.9)
MONO%: 16.9 % — AB (ref 0.0–14.0)
NEUT%: 55.4 % (ref 38.4–76.8)
NEUTROS ABS: 1.9 10*3/uL (ref 1.5–6.5)
Platelets: 252 10*3/uL (ref 145–400)
RBC: 3.69 10*6/uL — ABNORMAL LOW (ref 3.70–5.45)
RDW: 15.6 % — AB (ref 11.2–14.5)
WBC: 3.4 10*3/uL — ABNORMAL LOW (ref 3.9–10.3)
lymph#: 0.7 10*3/uL — ABNORMAL LOW (ref 0.9–3.3)

## 2017-01-22 LAB — COMPREHENSIVE METABOLIC PANEL
ALBUMIN: 3.8 g/dL (ref 3.5–5.0)
ALK PHOS: 71 U/L (ref 40–150)
ALT: 15 U/L (ref 0–55)
AST: 13 U/L (ref 5–34)
Anion Gap: 9 mEq/L (ref 3–11)
BUN: 11.4 mg/dL (ref 7.0–26.0)
CO2: 26 mEq/L (ref 22–29)
Calcium: 9.7 mg/dL (ref 8.4–10.4)
Chloride: 104 mEq/L (ref 98–109)
Creatinine: 0.6 mg/dL (ref 0.6–1.1)
EGFR: >90 mL/min/{1.73_m2} (ref >90)
GLUCOSE: 92 mg/dL (ref 70–140)
POTASSIUM: 3.9 meq/L (ref 3.5–5.1)
SODIUM: 139 meq/L (ref 136–145)
Total Bilirubin: 0.38 mg/dL (ref 0.20–1.20)
Total Protein: 7.3 g/dL (ref 6.4–8.3)

## 2017-01-22 MED ORDER — ONDANSETRON HCL 4 MG/2ML IJ SOLN
8.0000 mg | Freq: Once | INTRAMUSCULAR | Status: AC
  Administered 2017-01-22: 8 mg via INTRAVENOUS

## 2017-01-22 MED ORDER — SODIUM CHLORIDE 0.9% FLUSH
10.0000 mL | INTRAVENOUS | Status: DC | PRN
  Administered 2017-01-22: 10 mL
  Filled 2017-01-22: qty 10

## 2017-01-22 MED ORDER — ONDANSETRON HCL 4 MG/2ML IJ SOLN
INTRAMUSCULAR | Status: AC
  Filled 2017-01-22: qty 4

## 2017-01-22 MED ORDER — SODIUM CHLORIDE 0.9 % IV SOLN
Freq: Once | INTRAVENOUS | Status: AC
  Administered 2017-01-22: 13:00:00 via INTRAVENOUS

## 2017-01-22 MED ORDER — PACLITAXEL PROTEIN-BOUND CHEMO INJECTION 100 MG
65.0000 mg/m2 | Freq: Once | INTRAVENOUS | Status: AC
  Administered 2017-01-22: 125 mg via INTRAVENOUS
  Filled 2017-01-22: qty 25

## 2017-01-22 MED ORDER — HEPARIN SOD (PORK) LOCK FLUSH 100 UNIT/ML IV SOLN
500.0000 [IU] | Freq: Once | INTRAVENOUS | Status: AC | PRN
  Administered 2017-01-22: 500 [IU]
  Filled 2017-01-22: qty 5

## 2017-01-22 MED ORDER — SODIUM CHLORIDE 0.9% FLUSH
10.0000 mL | INTRAVENOUS | Status: DC | PRN
  Administered 2017-01-22: 10 mL via INTRAVENOUS
  Filled 2017-01-22: qty 10

## 2017-01-22 NOTE — Patient Instructions (Signed)
[facility] Discharge Instructions for Patients Receiving Chemotherapy  Today you received the following chemotherapy agents Abraxane  To help prevent nausea and vomiting after your treatment, we encourage you to take your nausea medication    If you develop nausea and vomiting that is not controlled by your nausea medication, call the clinic.   BELOW ARE SYMPTOMS THAT SHOULD BE REPORTED IMMEDIATELY:  *FEVER GREATER THAN 100.5 F  *CHILLS WITH OR WITHOUT FEVER  NAUSEA AND VOMITING THAT IS NOT CONTROLLED WITH YOUR NAUSEA MEDICATION  *UNUSUAL SHORTNESS OF BREATH  *UNUSUAL BRUISING OR BLEEDING  TENDERNESS IN MOUTH AND THROAT WITH OR WITHOUT PRESENCE OF ULCERS  *URINARY PROBLEMS  *BOWEL PROBLEMS  UNUSUAL RASH Items with * indicate a potential emergency and should be followed up as soon as possible.  Feel free to call the clinic you have any questions or concerns. The clinic phone number is [phone].  Please show the CHEMO ALERT CARD at check-in to the Emergency Department and triage nurse.   

## 2017-01-22 NOTE — Patient Instructions (Signed)

## 2017-01-29 ENCOUNTER — Ambulatory Visit: Payer: Medicare Other

## 2017-01-29 ENCOUNTER — Encounter: Payer: Self-pay | Admitting: Hematology and Oncology

## 2017-01-29 ENCOUNTER — Ambulatory Visit (HOSPITAL_BASED_OUTPATIENT_CLINIC_OR_DEPARTMENT_OTHER): Payer: Medicare Other | Admitting: Hematology and Oncology

## 2017-01-29 ENCOUNTER — Ambulatory Visit (HOSPITAL_BASED_OUTPATIENT_CLINIC_OR_DEPARTMENT_OTHER): Payer: Medicare Other

## 2017-01-29 ENCOUNTER — Other Ambulatory Visit (HOSPITAL_BASED_OUTPATIENT_CLINIC_OR_DEPARTMENT_OTHER): Payer: Medicare Other

## 2017-01-29 DIAGNOSIS — Z5111 Encounter for antineoplastic chemotherapy: Secondary | ICD-10-CM

## 2017-01-29 DIAGNOSIS — Z170 Estrogen receptor positive status [ER+]: Principal | ICD-10-CM

## 2017-01-29 DIAGNOSIS — C50411 Malignant neoplasm of upper-outer quadrant of right female breast: Secondary | ICD-10-CM

## 2017-01-29 DIAGNOSIS — D6481 Anemia due to antineoplastic chemotherapy: Secondary | ICD-10-CM

## 2017-01-29 DIAGNOSIS — Z95828 Presence of other vascular implants and grafts: Secondary | ICD-10-CM

## 2017-01-29 LAB — CBC WITH DIFFERENTIAL/PLATELET
BASO%: 0.3 % (ref 0.0–2.0)
BASOS ABS: 0 10*3/uL (ref 0.0–0.1)
EOS ABS: 0.3 10*3/uL (ref 0.0–0.5)
EOS%: 7.2 % — ABNORMAL HIGH (ref 0.0–7.0)
HEMATOCRIT: 32 % — AB (ref 34.8–46.6)
HEMOGLOBIN: 10.6 g/dL — AB (ref 11.6–15.9)
LYMPH%: 26 % (ref 14.0–49.7)
MCH: 28.9 pg (ref 25.1–34.0)
MCHC: 33.1 g/dL (ref 31.5–36.0)
MCV: 87.2 fL (ref 79.5–101.0)
MONO#: 0.5 10*3/uL (ref 0.1–0.9)
MONO%: 15.3 % — ABNORMAL HIGH (ref 0.0–14.0)
NEUT#: 1.8 10*3/uL (ref 1.5–6.5)
NEUT%: 51.2 % (ref 38.4–76.8)
Platelets: 245 10*3/uL (ref 145–400)
RBC: 3.67 10*6/uL — ABNORMAL LOW (ref 3.70–5.45)
RDW: 14.8 % — AB (ref 11.2–14.5)
WBC: 3.5 10*3/uL — ABNORMAL LOW (ref 3.9–10.3)
lymph#: 0.9 10*3/uL (ref 0.9–3.3)

## 2017-01-29 LAB — COMPREHENSIVE METABOLIC PANEL
ALT: 10 U/L (ref 0–55)
ANION GAP: 9 meq/L (ref 3–11)
AST: 13 U/L (ref 5–34)
Albumin: 3.8 g/dL (ref 3.5–5.0)
Alkaline Phosphatase: 69 U/L (ref 40–150)
BUN: 12 mg/dL (ref 7.0–26.0)
CHLORIDE: 104 meq/L (ref 98–109)
CO2: 26 meq/L (ref 22–29)
CREATININE: 0.6 mg/dL (ref 0.6–1.1)
Calcium: 9.8 mg/dL (ref 8.4–10.4)
GLUCOSE: 77 mg/dL (ref 70–140)
Potassium: 3.9 mEq/L (ref 3.5–5.1)
SODIUM: 139 meq/L (ref 136–145)
TOTAL PROTEIN: 7.4 g/dL (ref 6.4–8.3)
Total Bilirubin: 0.4 mg/dL (ref 0.20–1.20)

## 2017-01-29 MED ORDER — ONDANSETRON HCL 4 MG/2ML IJ SOLN
8.0000 mg | Freq: Once | INTRAMUSCULAR | Status: AC
  Administered 2017-01-29: 8 mg via INTRAVENOUS

## 2017-01-29 MED ORDER — PACLITAXEL PROTEIN-BOUND CHEMO INJECTION 100 MG
65.0000 mg/m2 | Freq: Once | INTRAVENOUS | Status: AC
  Administered 2017-01-29: 125 mg via INTRAVENOUS
  Filled 2017-01-29: qty 25

## 2017-01-29 MED ORDER — SODIUM CHLORIDE 0.9 % IV SOLN
Freq: Once | INTRAVENOUS | Status: AC
  Administered 2017-01-29: 13:00:00 via INTRAVENOUS

## 2017-01-29 MED ORDER — SODIUM CHLORIDE 0.9% FLUSH
10.0000 mL | INTRAVENOUS | Status: DC | PRN
  Administered 2017-01-29: 10 mL via INTRAVENOUS
  Filled 2017-01-29: qty 10

## 2017-01-29 MED ORDER — ONDANSETRON HCL 4 MG/2ML IJ SOLN
INTRAMUSCULAR | Status: AC
  Filled 2017-01-29: qty 4

## 2017-01-29 MED ORDER — SODIUM CHLORIDE 0.9% FLUSH
10.0000 mL | INTRAVENOUS | Status: DC | PRN
  Administered 2017-01-29: 10 mL
  Filled 2017-01-29: qty 10

## 2017-01-29 MED ORDER — HEPARIN SOD (PORK) LOCK FLUSH 100 UNIT/ML IV SOLN
500.0000 [IU] | Freq: Once | INTRAVENOUS | Status: AC | PRN
  Administered 2017-01-29: 500 [IU]
  Filled 2017-01-29: qty 5

## 2017-01-29 NOTE — Patient Instructions (Signed)

## 2017-01-29 NOTE — Progress Notes (Signed)
Patient Care Team: Nolene Ebbs, MD as PCP - General (Internal Medicine) Jovita Kussmaul, MD as Consulting Physician (General Surgery) Nicholas Lose, MD as Consulting Physician (Hematology and Oncology) Kyung Rudd, MD as Consulting Physician (Radiation Oncology) Delice Bison Charlestine Massed, NP as Nurse Practitioner (Hematology and Oncology)  DIAGNOSIS:  Encounter Diagnosis  Name Primary?  . Malignant neoplasm of upper-outer quadrant of right breast in female, estrogen receptor positive ([place])     SUMMARY OF ONCOLOGIC HISTORY:   Breast cancer of upper-outer quadrant of right female breast ([place])   07/24/2016 Initial Diagnosis    Palpable right breast mass for 2 months UOQ at 10:00: 4.7 x 4.1 x 3.7 cm with enlarged axillary lymph node biopsy benign, breast mass biopsy grade 3 IDC ER 30% week PR negative HER-2 negative Ki-67 90%      08/26/2016 Surgery    Right mastectomy: IDC grade 3, 7 cm, margins negative, 1/17 nodes positive, ER weakly +30%, PR negative, HER-2 negative, Ki-67 90%, T3 N1 stage IIIa      09/25/2016 -  Chemotherapy     Dose dense Adriamycin Cytoxan 4 followed y weelyAbraxane 12       11/23/2016 Genetic Testing    Results pending.  Genes tested include: APC, ATM, AXIN2, BARD1, BMPR1A, BRCA1, BRCA2, BRIP1, CDH1, CDKN2A (p14ARF), CDKN2A (p16INK4a), CHEK2, DICER1, EPCAM (Deletion/duplication testing only), GREM1 (promoter region deletion/duplication testing only), KIT, MEN1, MLH1, MSH2, MSH6, MUTYH, NBN, NF1, PALB2, PDGFRA, PMS2, POLD1, POLE, PTEN, RAD50, RAD51C, RAD51D, SDHB, SDHC, SDHD, SMAD4, SMARCA4. STK11, TP53, TSC1, TSC2, and VHL.  The following gene was evaluated for sequence changes only: SDHA and HOXB13 c.251G>A variant only.       Malignant neoplasm of upper-outer quadrant of right breast in female, estrogen receptor positive ([place])   08/05/2016 Initial Diagnosis    Malignant neoplasm of upper-outer quadrant of right breast in female, estrogen receptor positive  ([place])     12/03/2016 Genetic Testing    APC c.2213A>G and POLE c.1064A>G VUS found on the Common Hereditary cancer panel.  The Hereditary Gene Panel offered by [facility] includes sequencing and/or deletion duplication testing of the following 46 genes: APC, ATM, AXIN2, BARD1, BMPR1A, BRCA1, BRCA2, BRIP1, CDH1, CDKN2A (p14ARF), CDKN2A (p16INK4a), CHEK2, CTNNA1, DICER1, EPCAM (Deletion/duplication testing only), GREM1 (promoter region deletion/duplication testing only), KIT, MEN1, MLH1, MSH2, MSH3, MSH6, MUTYH, NBN, NF1, NHTL36mPALB2, PDGFRA, PMS2, POLD1, POLE, PTEN, RAD50, RAD51C, RAD51D, SDHB, SDHC, SDHD, SMAD4, SMARCA4. STK11, TP53, TSC1, TSC2, and VHL.  The following gene was evaluated for sequence changes only: SDHA and HOXB13 c.251G>A variant only.  The report date is Dec 03, 2016.        CHIEF COMPLIANT: Cycle 11 Abraxane  INTERVAL HISTORY: Casey Fornessis a 66year old with above-mentioned history of right breast cancer currently on adjuvant chemotherapy and today is cycle 11 of Abraxane. She is tolerating it extremely well. She has very mild neuropathy. Denies any nausea vomiting. Her taste and appetite are excellent. Energy levels are slightly low but have remained stable.  REVIEW OF SYSTEMS:   Constitutional: Denies fevers, chills or abnormal weight loss Eyes: Denies blurriness of vision Ears, nose, mouth, throat, and face: Denies mucositis or sore throat Respiratory: Denies cough, dyspnea or wheezes Cardiovascular: Denies palpitation, chest discomfort Gastrointestinal:  Denies nausea, heartburn or change in bowel habits Skin: Denies abnormal skin rashes Lymphatics: Denies new lymphadenopathy or easy bruising Neurological:Denies numbness, tingling or new weaknesses Behavioral/Psych: Mood is stable, no new changes  Extremities: No lower extremity edema Breast:  denies any pain or lumps or nodules in either breasts All other systems were reviewed with the patient and are negative.  I  have reviewed the past medical history, past surgical history, social history and family history with the patient and they are unchanged from previous note.  ALLERGIES:  is allergic to percocet [oxycodone-acetaminophen].  MEDICATIONS:  Current Outpatient Prescriptions  Medication Sig Dispense Refill  . acetaminophen (TYLENOL) 325 MG tablet Take 650 mg by mouth every 6 (six) hours as needed for mild pain.    Marland Kitchen aspirin EC 81 MG tablet Take 81 mg by mouth daily.    . BD PEN NEEDLE NANO U/F 32G X 4 MM MISC USE TO INJECT INSULIN EVERY DAY (E11.65)  5  . Blood Glucose Monitoring Suppl (ONETOUCH VERIO FLEX SYSTEM) w/Device KIT USE TO TEST 3 TIMES DAILY DX E11.65  11  . Calcium Carbonate-Vitamin D (CALTRATE 600+D PO) Take 1 tablet by mouth 2 (two) times daily.    Marland Kitchen gabapentin (NEURONTIN) 100 MG capsule Take 1 capsule (100 mg total) by mouth 3 (three) times daily. 90 capsule 3  . Hyprom-Naphaz-Polysorb-Zn Sulf (CLEAR EYES COMPLETE OP) Apply 2 drops to eye 2 (two) times daily as needed (irritation).    Marland Kitchen LANTUS SOLOSTAR 100 UNIT/ML Solostar Pen INJECT 20 UNITS IN THE MORNING  5  . lidocaine-prilocaine (EMLA) cream Apply to affected area once 30 g 3  . loratadine (CLARITIN) 10 MG tablet Take 10 mg by mouth daily as needed for allergies or rhinitis.    Marland Kitchen LORazepam (ATIVAN) 0.5 MG tablet Take 1 tablet (0.5 mg total) by mouth every 6 (six) hours as needed (Nausea or vomiting). 30 tablet 0  . losartan (COZAAR) 100 MG tablet Take 100 mg by mouth daily.  2  . metFORMIN (GLUCOPHAGE) 1000 MG tablet Take 1,000 mg by mouth 2 (two) times daily.  5  . Multiple Vitamin (MULTIVITAMIN WITH MINERALS) TABS tablet Take 1 tablet by mouth daily.    . ondansetron (ZOFRAN) 8 MG tablet Take 1 tablet (8 mg total) by mouth 2 (two) times daily as needed. Start on the third day after chemotherapy. 30 tablet 1  . ONETOUCH DELICA LANCETS FINE MISC USE TO TEST 3 TIMES DAILY DX E11.65  11  . ONETOUCH VERIO test strip USE TO TEST 3  TIMES DAILY DX E11.65  11  . prochlorperazine (COMPAZINE) 10 MG tablet Take 1 tablet (10 mg total) by mouth every 6 (six) hours as needed (Nausea or vomiting). 30 tablet 1  . simvastatin (ZOCOR) 10 MG tablet Take 10 mg by mouth daily at 6 PM.   2  . thiamine (VITAMIN B-1) 100 MG tablet Take 100 mg by mouth daily.     No current facility-administered medications for this visit.    Facility-Administered Medications Ordered in Other Visits  Medication Dose Route Frequency Provider Last Rate Last Dose  . sodium chloride flush (NS) 0.9 % injection 10 mL  10 mL Intravenous PRN Nicholas Lose, MD   10 mL at 09/25/16 1112    PHYSICAL EXAMINATION: ECOG PERFORMANCE STATUS: 1 - Symptomatic but completely ambulatory  Vitals:   01/29/17 1221  BP: 127/62  Pulse: 80  Resp: 18  Temp: 98.2 F (36.8 C)   Filed Weights   01/29/17 1221  Weight: 154 lb 8 oz (70.1 kg)    GENERAL:alert, no distress and comfortable SKIN: skin color, texture, turgor are normal, no rashes or significant lesions EYES: normal, Conjunctiva are pink and non-injected, sclera clear OROPHARYNX:no  exudate, no erythema and lips, buccal mucosa, and tongue normal  NECK: supple, thyroid normal size, non-tender, without nodularity LYMPH:  no palpable lymphadenopathy in the cervical, axillary or inguinal LUNGS: clear to auscultation and percussion with normal breathing effort HEART: regular rate & rhythm and no murmurs and no lower extremity edema ABDOMEN:abdomen soft, non-tender and normal bowel sounds MUSCULOSKELETAL:no cyanosis of digits and no clubbing  NEURO: alert & oriented x 3 with fluent speech, no focal motor/sensory deficits EXTREMITIES: No lower extremity edema  LABORATORY DATA:  I have reviewed the data as listed   Chemistry      Component Value Date/Time   NA 139 01/22/2017 1102   K 3.9 01/22/2017 1102   CL 101 08/21/2016 1333   CO2 26 01/22/2017 1102   BUN 11.4 01/22/2017 1102   CREATININE 0.6 01/22/2017  1102      Component Value Date/Time   CALCIUM 9.7 01/22/2017 1102   ALKPHOS 71 01/22/2017 1102   AST 13 01/22/2017 1102   ALT 15 01/22/2017 1102   BILITOT 0.38 01/22/2017 1102       Lab Results  Component Value Date   WBC 3.5 (L) 01/29/2017   HGB 10.6 (L) 01/29/2017   HCT 32.0 (L) 01/29/2017   MCV 87.2 01/29/2017   PLT 245 01/29/2017   NEUTROABS 1.8 01/29/2017    ASSESSMENT & PLAN:  Breast cancer of upper-outer quadrant of right female breast ([place]) 07/24/2016: Palpable right breast mass for 2 months UOQ at 10:00: 4.7 x 4.1 x 3.7 cm with enlarged axillary lymph node biopsy benign, breast mass biopsy grade 3 IDC ER 30% week PR negative HER-2 negative Ki-67 90% CT CAP and Bone scan: No mets  Treatment plan based on multidisciplinary tumor board: 1. Right mastectomy01/24/2018: IDC grade 3, 7 cm, margins negative, 1/17 nodes positive, ER weakly +30%, PR negative, HER-2 negative, Ki-67 90%, T3 N1 stage IIIa 2. Adjuvant chemotherapy with Adriamycin and Cytoxan dose dense 4 followed by Abraxane weekly 12 (Patient cannot state steroids due to diabetes and so we are not using Taxol) 3. Followed by adjuvant radiation therapy 4. Followed by adjuvant antiestrogen therapy -------------------------------------------------------------------------------------------------------------------------- Current treatment: Completed 4 cycles ofdose dense Adriamycin Cytoxan, today is cycle 11Abraxane Echocardiogram 09/16/2016: EF 60-65 %  Chemotherapy toxicities: 1. Fatigue due to chemotherapy 2. nausea grade 1 3. Anemia: Being monitored 4. 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 Symptoms are stable  Return to clinic weekly for Abraxane andin 2 weeks she completes her 12 cycle of Abraxane  I will make an appointment for her to see radiation oncology.   I spent 25 minutes talking to the patient of which more than half was spent in counseling and  coordination of care.  No orders of the defined types were placed in this encounter.  The patient has a good understanding of the overall plan. she agrees with it. she will call with any problems that may develop before the next visit here.   Rulon Eisenmenger, MD 01/29/17

## 2017-01-29 NOTE — Assessment & Plan Note (Signed)
07/24/2016: Palpable right breast mass for 2 months UOQ at 10:00: 4.7 x 4.1 x 3.7 cm with enlarged axillary lymph node biopsy benign, breast mass biopsy grade 3 IDC ER 30% week PR negative HER-2 negative Ki-67 90% CT CAP and Bone scan: No mets  Treatment plan based on multidisciplinary tumor board: 1. Right mastectomy01/24/2018: IDC grade 3, 7 cm, margins negative, 1/17 nodes positive, ER weakly +30%, PR negative, HER-2 negative, Ki-67 90%, T3 N1 stage IIIa 2. Adjuvant chemotherapy with Adriamycin and Cytoxan dose dense 4 followed by Abraxane weekly 12 (Patient cannot state steroids due to diabetes and so we are not using Taxol) 3. Followed by adjuvant radiation therapy 4. Followed by adjuvant antiestrogen therapy -------------------------------------------------------------------------------------------------------------------------- Current treatment: Completed 4 cycles ofdose dense Adriamycin Cytoxan, today is cycle 10Abraxane Echocardiogram 09/16/2016: EF 60-65 %  Chemotherapy toxicities: 1. Fatigue due to chemotherapy 2. nausea grade 1 3. Anemia: Being monitored 4. 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 Symptoms are stable  Return to clinic weekly for Abraxane andin 2 weeks she completes her 12 cycle of Abraxane  I will make an appointment for her to see radiation oncology.

## 2017-01-29 NOTE — Patient Instructions (Signed)
[facility] Discharge Instructions for Patients Receiving Chemotherapy  Today you received the following chemotherapy agents Abraxane  To help prevent nausea and vomiting after your treatment, we encourage you to take your nausea medication    If you develop nausea and vomiting that is not controlled by your nausea medication, call the clinic.   BELOW ARE SYMPTOMS THAT SHOULD BE REPORTED IMMEDIATELY:  *FEVER GREATER THAN 100.5 F  *CHILLS WITH OR WITHOUT FEVER  NAUSEA AND VOMITING THAT IS NOT CONTROLLED WITH YOUR NAUSEA MEDICATION  *UNUSUAL SHORTNESS OF BREATH  *UNUSUAL BRUISING OR BLEEDING  TENDERNESS IN MOUTH AND THROAT WITH OR WITHOUT PRESENCE OF ULCERS  *URINARY PROBLEMS  *BOWEL PROBLEMS  UNUSUAL RASH Items with * indicate a potential emergency and should be followed up as soon as possible.  Feel free to call the clinic you have any questions or concerns. The clinic phone number is [phone].  Please show the CHEMO ALERT CARD at check-in to the Emergency Department and triage nurse.   

## 2017-02-01 ENCOUNTER — Encounter: Payer: Self-pay | Admitting: Radiation Oncology

## 2017-02-01 NOTE — Progress Notes (Signed)
Location of Breast Cancer:Upper outer quadrant Right Breast  Histology per Pathology Report: Diagnosis 07/24/2016: 1. Breast, right, needle core biopsy, 10 o'clock - INVASIVE DUCTAL CARCINOMA.- SEE COMMENT. 2. Lymph node, needle/core biopsy, right axilla - THERE IS NO EVIDENCE OF CARCINOMA 1 OF 1 LYMPH NODE (0/1).  Receptor Status: ER(30%+), PR (neg), Her2-neu (neg ratio=1.36), Ki-67(90%)  Did patient present with symptoms (if so, please note symptoms) or was this found on screening mammography?:   Past/Anticipated interventions by surgeon, if OHK:GOVPCHEKB 08/26/2016: Dr. Autumn Messing, III,MD Breast, modified radical mastectomy , Right - INVASIVE DUCTAL CARCINOMA, GRADE 3, SPANNING 7 CM. - RESECTION MARGINS ARE NEGATIVE. - METASTATIC CARCINOMA IN ONE OF SEVENTEEN LYMPH NODES (1/17).  Past/Anticipated interventions by medical oncology, if any: Chemotherapy : Dr. Lindi Adie :01/29/17 : completd 4 cycles of dose dense Adriamycin,Cytoxin and today is  Cycle 11 Abraxane , return 2 weeks  To complete cycle 12 of Abrxane  09/25/2016 -  Chemotherapy     Dose dense Adriamycin Cytoxan 4 followed y weelyAbraxane 12       11/23/2016 Genetic Testing    Results pending.  Genes tested include: APC, ATM, AXIN2, BARD1, BMPR1A, BRCA1, BRCA2, BRIP1, CDH1, CDKN2A (p14ARF), CDKN2A (p16INK4a), CHEK2, DICER1, EPCAM (Deletion/duplication testing only), GREM1 (promoter region deletion/duplication testing only), KIT, MEN1, MLH1, MSH2, MSH6, MUTYH, NBN, NF1, PALB2, PDGFRA, PMS2, POLD1, POLE, PTEN, RAD50, RAD51C, RAD51D, SDHB, SDHC, SDHD, SMAD4, SMARCA4. STK11, TP53, TSC1, TSC2, and VHL. The following gene was evaluated for sequence changes only: SDHA and HOXB13 c.251G>A variant only.    /10/2016 Genetic Testing     APC c.2213A>G and POLE c.1064A>G VUS found on the Common Hereditary cancer panel.  The Hereditary Gene Panel offered by [facility] includes sequencing and/or deletion duplication testing of the  following 46 genes: APC, ATM, AXIN2, BARD1, BMPR1A, BRCA1, BRCA2, BRIP1, CDH1, CDKN2A (p14ARF), CDKN2A (p16INK4a), CHEK2, CTNNA1, DICER1, EPCAM (Deletion/duplication testing only), GREM1 (promoter region deletion/duplication testing only), KIT, MEN1, MLH1, MSH2, MSH3, MSH6, MUTYH, NBN, NF1, NHTL12mPALB2, PDGFRA, PMS2, POLD1, POLE, PTEN, RAD50, RAD51C, RAD51D, SDHB, SDHC, SDHD, SMAD4, SMARCA4. STK11, TP53, TSC1, TSC2, and VHL.  The following gene was evaluated for sequence changes only: SDHA and HOXB13 c.251G>A variant only.  The report date is Dec 03, 2016.       Lymphedema issues, if any: yes,wears  Sleeve  t times Pain issues, if any:  No  SAFETY ISSUES:  Prior radiation? NO  Pacemaker/ICD? NO  Is the patient on methotrexate? NO  Current Complaints / other details:  Menarche age 66 [id number] Sister bleeding disorder, Daughter CVA,Heart disease sister, Paternal aunt breast cancer , Paternal cousin breast cancer, sister pancreas cancer, maternal 1st cousin breast cancer, dx late 20's  Allergies: percocet -Nausea   BP (!) 149/61 Comment: left arm sitting  Pulse 94   Temp 98.5 F (36.9 C) (Oral)   Resp 18   Ht '5\' 3"'  (1.6 m)   Wt 156 lb (70.8 kg)   BMI 27.63 kg/m   Wt Readings from Last 3 Encounters:  02/04/17 156 lb (70.8 kg)  01/29/17 154 lb 8 oz (70.1 kg)  01/08/17 156 lb 9.6 oz (71 kg)    .l MRebecca Eaton RN 02/01/2017,3:22 PM

## 2017-02-04 ENCOUNTER — Ambulatory Visit
Admission: RE | Admit: 2017-02-04 | Discharge: 2017-02-04 | Disposition: A | Discharge status: Home / Self Care | Payer: Medicare Other | Source: Ambulatory Visit | Attending: Radiation Oncology | Admitting: Radiation Oncology

## 2017-02-04 DIAGNOSIS — Z823 Family history of stroke: Secondary | ICD-10-CM | POA: Diagnosis not present

## 2017-02-04 DIAGNOSIS — Z8371 Family history of colonic polyps: Secondary | ICD-10-CM | POA: Diagnosis not present

## 2017-02-04 DIAGNOSIS — Z832 Family history of diseases of the blood and blood-forming organs and certain disorders involving the immune mechanism: Secondary | ICD-10-CM | POA: Insufficient documentation

## 2017-02-04 DIAGNOSIS — Z7982 Long term (current) use of aspirin: Secondary | ICD-10-CM | POA: Diagnosis not present

## 2017-02-04 DIAGNOSIS — Z794 Long term (current) use of insulin: Secondary | ICD-10-CM | POA: Insufficient documentation

## 2017-02-04 DIAGNOSIS — I10 Essential (primary) hypertension: Secondary | ICD-10-CM | POA: Insufficient documentation

## 2017-02-04 DIAGNOSIS — Z885 Allergy status to narcotic agent status: Secondary | ICD-10-CM | POA: Insufficient documentation

## 2017-02-04 DIAGNOSIS — Z170 Estrogen receptor positive status [ER+]: Secondary | ICD-10-CM | POA: Diagnosis not present

## 2017-02-04 DIAGNOSIS — Z510 Encounter for antineoplastic radiation therapy: Secondary | ICD-10-CM | POA: Insufficient documentation

## 2017-02-04 DIAGNOSIS — Z800 Family history of malignant neoplasm of digestive organs: Secondary | ICD-10-CM | POA: Diagnosis not present

## 2017-02-04 DIAGNOSIS — E119 Type 2 diabetes mellitus without complications: Secondary | ICD-10-CM | POA: Diagnosis not present

## 2017-02-04 DIAGNOSIS — Z803 Family history of malignant neoplasm of breast: Secondary | ICD-10-CM | POA: Diagnosis not present

## 2017-02-04 DIAGNOSIS — C50411 Malignant neoplasm of upper-outer quadrant of right female breast: Secondary | ICD-10-CM

## 2017-02-04 DIAGNOSIS — E7800 Pure hypercholesterolemia, unspecified: Secondary | ICD-10-CM | POA: Insufficient documentation

## 2017-02-04 DIAGNOSIS — Z79899 Other long term (current) drug therapy: Secondary | ICD-10-CM | POA: Diagnosis not present

## 2017-02-04 DIAGNOSIS — Z9011 Acquired absence of right breast and nipple: Secondary | ICD-10-CM | POA: Diagnosis not present

## 2017-02-04 DIAGNOSIS — Z90710 Acquired absence of both cervix and uterus: Secondary | ICD-10-CM | POA: Insufficient documentation

## 2017-02-04 DIAGNOSIS — Z8042 Family history of malignant neoplasm of prostate: Secondary | ICD-10-CM | POA: Diagnosis not present

## 2017-02-04 HISTORY — DX: Malignant neoplasm of unspecified site of unspecified female breast: C50.919

## 2017-02-04 NOTE — Progress Notes (Signed)
Radiation Oncology         [phone] ________________________________  Name: Casey Olson MRN: [id number]  Date: 02/04/2017  DOB: 1950-08-12  RF:[id number], Christean Grief, MD  Nicholas Lose, MD     REFERRING PHYSICIAN: Nicholas Lose, MD   DIAGNOSIS: The encounter diagnosis was Malignant neoplasm of upper-outer quadrant of right female breast, unspecified estrogen receptor status ([place]). 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 female has was seen in the multidisciplinary breast clinic for her right breast cancer. The patient was found to have a palpable mass for about 2 months in the right breast. Diagnostic imaging revealed a 4.7 x 4.1 cm mass in the right breast with suspicious cortical thickening in the right axillary node seen on ultrasound. A biopsy of both the breast tumor and node were performed. The breast biopsy revealed ER weakly positive, PR negative, HER 2 negative, Ki 67 90%, grade 3 invasive ductal carcinoma. Her lymph node was benign on biopsy.   Patient underwent a right mastectomy on 08/26/16. 1/17 nodes were positive for malignancy, margins were negative. IDC grade 3, 7 cm, ER weakly pos, PR neg, Her-2 neg, Ki-67 90%, T3N1 stage IIIa.  Dr. Lindi Adie proceeded with adjuvant chemotherapy with the patient receiving her eleventh of twelve doses of Abraxane on 01/29/17. Final treatment is scheduled for tomorrow. She has completed 4 cycles of dose dense Adriamycin, Cytoxin.  On review of systems, the patient endorses having healed well from surgery. She endorses mild nausea with chemotherapy but states she has done relatively well.   PREVIOUS RADIATION THERAPY: No   PAST MEDICAL HISTORY:  has a past medical history of Breast cancer ([place]) (07/24/16 bx); Bronchitis; Cancer ([place]); Diabetes mellitus without complication ([place]);  Family history of breast cancer; Family history of pancreatic cancer; Family history of prostate cancer; GERD (gastroesophageal reflux disease); High cholesterol; and Hypertension.     PAST SURGICAL HISTORY: Past Surgical History:  Procedure Laterality Date  . ABDOMINAL HYSTERECTOMY    . COLONOSCOPY    . MASTECTOMY W/ SENTINEL NODE BIOPSY Right 08/26/2016   Procedure: RIGHT MASTECTOMY WITH SENTINEL LYMPH NODE BIOPSY;  Surgeon: Autumn Messing III, MD;  Location: [facility];  Service: General;  Laterality: Right;  . PORTACATH PLACEMENT Left 08/26/2016   Procedure: INSERTION PORT-A-CATH;  Surgeon: Autumn Messing III, MD;  Location: [facility];  Service: General;  Laterality: Left;  . TUBAL LIGATION    . WISDOM TOOTH EXTRACTION       FAMILY HISTORY: family history includes Breast cancer in her cousin, cousin, cousin, cousin, and paternal aunt; Colon polyps in her sister; Heart attack in her paternal grandfather and paternal grandmother; Leukemia in her maternal grandmother; Pancreatic cancer (age of onset: 56) in her sister; Prostate cancer in her father and maternal uncle; Sickle cell anemia in her other; Sickle cell trait in her brother and sister; Stroke in her mother.   SOCIAL HISTORY:  reports that she has never smoked. She has never used smokeless tobacco. She reports that she does not drink alcohol or use drugs.   ALLERGIES: Percocet [oxycodone-acetaminophen]   MEDICATIONS:  Current Outpatient Prescriptions  Medication Sig Dispense Refill  . acetaminophen (TYLENOL) 325 MG tablet Take 650 mg by mouth every 6 (six) hours as needed for mild pain.    Marland Kitchen aspirin EC 81 MG tablet Take 81 mg by mouth daily.    Marland Kitchen  BD PEN NEEDLE NANO U/F 32G X 4 MM MISC USE TO INJECT INSULIN EVERY DAY (E11.65)  5  . Blood Glucose Monitoring Suppl (ONETOUCH VERIO FLEX SYSTEM) w/Device KIT USE TO TEST 3 TIMES DAILY DX E11.65  11  . Calcium Carbonate-Vitamin D (CALTRATE 600+D PO) Take 1 tablet by mouth 2 (two) times daily.    Marland Kitchen  gabapentin (NEURONTIN) 100 MG capsule Take 1 capsule (100 mg total) by mouth 3 (three) times daily. 90 capsule 3  . lidocaine-prilocaine (EMLA) cream Apply to affected area once 30 g 3  . loratadine (CLARITIN) 10 MG tablet Take 10 mg by mouth daily as needed for allergies or rhinitis.    Marland Kitchen losartan (COZAAR) 100 MG tablet Take 100 mg by mouth daily.  2  . metFORMIN (GLUCOPHAGE) 1000 MG tablet Take 1,000 mg by mouth 2 (two) times daily.  5  . Multiple Vitamin (MULTIVITAMIN WITH MINERALS) TABS tablet Take 1 tablet by mouth daily.    Glory Rosebush DELICA LANCETS FINE MISC USE TO TEST 3 TIMES DAILY DX E11.65  11  . ONETOUCH VERIO test strip USE TO TEST 3 TIMES DAILY DX E11.65  11  . simvastatin (ZOCOR) 10 MG tablet Take 10 mg by mouth daily at 6 PM.   2  . Hyprom-Naphaz-Polysorb-Zn Sulf (CLEAR EYES COMPLETE OP) Apply 2 drops to eye 2 (two) times daily as needed (irritation).    Marland Kitchen LANTUS SOLOSTAR 100 UNIT/ML Solostar Pen INJECT 20 UNITS IN THE MORNING  5  . LORazepam (ATIVAN) 0.5 MG tablet Take 1 tablet (0.5 mg total) by mouth every 6 (six) hours as needed (Nausea or vomiting). (Patient not taking: Reported on 02/04/2017) 30 tablet 0  . ondansetron (ZOFRAN) 8 MG tablet Take 1 tablet (8 mg total) by mouth 2 (two) times daily as needed. Start on the third day after chemotherapy. (Patient not taking: Reported on 02/04/2017) 30 tablet 1  . prochlorperazine (COMPAZINE) 10 MG tablet Take 1 tablet (10 mg total) by mouth every 6 (six) hours as needed (Nausea or vomiting). (Patient not taking: Reported on 02/04/2017) 30 tablet 1  . thiamine (VITAMIN B-1) 100 MG tablet Take 100 mg by mouth daily.     No current facility-administered medications for this encounter.    Facility-Administered Medications Ordered in Other Encounters  Medication Dose Route Frequency Provider Last Rate Last Dose  . sodium chloride flush (NS) 0.9 % injection 10 mL  10 mL Intravenous PRN Nicholas Lose, MD   10 mL at 09/25/16 1112     REVIEW  OF SYSTEMS:  A 15 point review of systems is documented in the electronic medical record. This was obtained by the nursing staff. However, I reviewed this with the patient to discuss relevant findings and make appropriate changes.  Pertinent items are noted in HPI.    PHYSICAL EXAM:  height is 5' 3" (1.6 m) and weight is 156 lb (70.8 kg). Her oral temperature is 98.5 F (36.9 C). Her blood pressure is 149/61 (abnormal) and her pulse is 94. Her respiration is 18.  Right breast shows well-healed mastectomy incision. Left breast is negative on exam. ECOG = 0  0 - Asymptomatic (Fully active, able to carry on all predisease activities without restriction)  1 - Symptomatic but completely ambulatory (Restricted in physically strenuous activity but ambulatory and able to carry out work of a light or sedentary nature. For example, light housework, office work)  2 - Symptomatic, <50% in bed during the day (Ambulatory and capable of  all self care but unable to carry out any work activities. Up and about more than 50% of waking hours)  3 - Symptomatic, >50% in bed, but not bedbound (Capable of only limited self-care, confined to bed or chair 50% or more of waking hours)  4 - Bedbound (Completely disabled. Cannot carry on any self-care. Totally confined to bed or chair)  5 - Death   Eustace Pen MM, Creech RH, Tormey DC, et al. [phone]). "Toxicity and response criteria of the [facility]". [place] Oncol. 5 (6): 649-55     LABORATORY DATA:  Lab Results  Component Value Date   WBC 3.5 (L) 01/29/2017   HGB 10.6 (L) 01/29/2017   HCT 32.0 (L) 01/29/2017   MCV 87.2 01/29/2017   PLT 245 01/29/2017   Lab Results  Component Value Date   NA 139 01/29/2017   K 3.9 01/29/2017   CL 101 08/21/2016   CO2 26 01/29/2017   Lab Results  Component Value Date   ALT 10 01/29/2017   AST 13 01/29/2017   ALKPHOS 69 01/29/2017   BILITOT 0.40 01/29/2017      RADIOGRAPHY: No results  found.     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 This would consist of right postmastectomy radiation treatment to the right breast/chest wall and right supraclavicular region.   We discussed the possible side effects and risks of treatment in addition to the possible benefits. All of the patient's questions were answered. The patient does wish to proceed with this treatment. I am therefore recommending a 6.5 week course of treatment. She will be scheduled for a simulation in approximately 2 weeks such that we can proceed with treatment planning.   I spent 30 minutes face to face with the patient and more than 50% of that time was spent in counseling and/or coordination of care.    ________________________________   Jodelle Gross, MD, PhD   **Disclaimer: This note was dictated with voice recognition software. Similar sounding words can inadvertently be transcribed and this note may contain transcription errors which may not have been corrected upon publication of note.**  This document serves as a record of services personally performed by Kyung Rudd, MD. It was created on his behalf by Linward Natal, a trained medical scribe. The creation of this record is based on the scribe's personal observations and the provider's statements to them. This document has been checked and approved by the attending provider.

## 2017-02-04 NOTE — Assessment & Plan Note (Addendum)
07/24/2016: Palpable right breast mass for 2 months UOQ at 10:00: 4.7 x 4.1 x 3.7 cm with enlarged axillary lymph node biopsy benign, breast mass biopsy grade 3 IDC ER 30% week PR negative HER-2 negative Ki-67 90% CT CAP and Bone scan: No mets  Treatment plan based on multidisciplinary tumor board: 1. Right mastectomy01/24/2018: IDC grade 3, 7 cm, margins negative, 1/17 nodes positive, ER weakly +30%, PR negative, HER-2 negative, Ki-67 90%, T3 N1 stage IIIa 2. Adjuvant chemotherapy with Adriamycin and Cytoxan dose dense 4 followed by Abraxane weekly 12 (Patient cannot state steroids due to diabetes and so we are not using Taxol) 3. Followed by adjuvant radiation therapy 4. Followed by adjuvant antiestrogen therapy -------------------------------------------------------------------------------------------------------------------------- Current treatment: Completed 4 cycles ofdose dense Adriamycin Cytoxan, today is cycle 12Abraxane Echocardiogram 09/16/2016: EF 60-65 %  Chemotherapy toxicities: 1. Fatigue due to chemotherapy 2. nausea grade 1 3. Anemia: Being monitored 4. 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 Symptoms are stable  5. Nail dyscrasia  Casey Olson will finish chemotherapy today!  She is very excited, however is slightly nervous about radiation.  She will undergo simulation in 2 weeks. We will send a message to Dr. Toth about port removal.    Casey Olson will return in about 8-10 weeks (following radiation) for follow up with Dr. Gudena.      

## 2017-02-04 NOTE — Progress Notes (Signed)
[facility] Follow up:    Nolene Ebbs, MD [place] [id number]   DIAGNOSIS: Cancer Staging Malignant neoplasm of upper-outer quadrant of right breast in female, estrogen receptor positive ([place]) Staging form: Breast, AJCC 8th Edition - Clinical: cT2, cN0, cM0, ER: Positive, PR: Negative, HER2: Negative - Unsigned   SUMMARY OF ONCOLOGIC HISTORY:   Breast cancer of upper-outer quadrant of right female breast ([place])   07/24/2016 Initial Diagnosis    Palpable right breast mass for 2 months UOQ at 10:00: 4.7 x 4.1 x 3.7 cm with enlarged axillary lymph node biopsy benign, breast mass biopsy grade 3 IDC ER 30% week PR negative HER-2 negative Ki-67 90%      08/26/2016 Surgery    Right mastectomy: IDC grade 3, 7 cm, margins negative, 1/17 nodes positive, ER weakly +30%, PR negative, HER-2 negative, Ki-67 90%, T3 N1 stage IIIa      09/25/2016 -  Chemotherapy     Dose dense Adriamycin Cytoxan 4 followed y weelyAbraxane 12       11/23/2016 Genetic Testing    Results pending.  Genes tested include: APC, ATM, AXIN2, BARD1, BMPR1A, BRCA1, BRCA2, BRIP1, CDH1, CDKN2A (p14ARF), CDKN2A (p16INK4a), CHEK2, DICER1, EPCAM (Deletion/duplication testing only), GREM1 (promoter region deletion/duplication testing only), KIT, MEN1, MLH1, MSH2, MSH6, MUTYH, NBN, NF1, PALB2, PDGFRA, PMS2, POLD1, POLE, PTEN, RAD50, RAD51C, RAD51D, SDHB, SDHC, SDHD, SMAD4, SMARCA4. STK11, TP53, TSC1, TSC2, and VHL.  The following gene was evaluated for sequence changes only: SDHA and HOXB13 c.251G>A variant only.       Malignant neoplasm of upper-outer quadrant of right breast in female, estrogen receptor positive ([place])   08/05/2016 Initial Diagnosis    Malignant neoplasm of upper-outer quadrant of right breast in female, estrogen receptor positive ([place])     12/03/2016 Genetic Testing    APC c.2213A>G and POLE c.1064A>G VUS found on the Common Hereditary cancer panel.  The Hereditary Gene  Panel offered by [facility] includes sequencing and/or deletion duplication testing of the following 46 genes: APC, ATM, AXIN2, BARD1, BMPR1A, BRCA1, BRCA2, BRIP1, CDH1, CDKN2A (p14ARF), CDKN2A (p16INK4a), CHEK2, CTNNA1, DICER1, EPCAM (Deletion/duplication testing only), GREM1 (promoter region deletion/duplication testing only), KIT, MEN1, MLH1, MSH2, MSH3, MSH6, MUTYH, NBN, NF1, NHTL56mPALB2, PDGFRA, PMS2, POLD1, POLE, PTEN, RAD50, RAD51C, RAD51D, SDHB, SDHC, SDHD, SMAD4, SMARCA4. STK11, TP53, TSC1, TSC2, and VHL.  The following gene was evaluated for sequence changes only: SDHA and HOXB13 c.251G>A variant only.  The report date is Dec 03, 2016.        CURRENT THERAPY: Abraxane week 12  INTERVAL HISTORY: WImogean Ciampa89y.o. female returns for evaluation prior to her final Abraxane treatment.  She does have a mild nail dyscrasia, and mild intermittent peripheral neuropathy in the tips of her fingers, but otherwise, is tolerating the chemotherapy well.  She denies any motor deficits in her fingertips or challenges with balance or walking.  She met with Dr. MLisbeth Renshawlast week and will undergo simulation for radiation soon.  She is ready to get her port removed.      Patient Active Problem List   Diagnosis Date Noted  . Chemotherapy-induced peripheral neuropathy ([place] 12/11/2016  . Genetic testing 12/11/2016  . Family history of breast cancer   . Family history of pancreatic cancer   . Family history of prostate cancer   . Port catheter in place 09/25/2016  . Malignant neoplasm of upper-outer quadrant of right breast in female, estrogen receptor positive ([place] 08/05/2016  .  Breast cancer of upper-outer quadrant of right female breast ([place]) 07/31/2016    is allergic to percocet [oxycodone-acetaminophen].  MEDICAL HISTORY: Past Medical History:  Diagnosis Date  . Breast cancer ([place]) 07/24/16 bx   right breast  . Bronchitis   . Cancer [facility])    right breast  . Diabetes mellitus without  complication ([place])    type 2  . Family history of breast cancer   . Family history of pancreatic cancer   . Family history of prostate cancer   . GERD (gastroesophageal reflux disease)   . High cholesterol   . Hypertension     SURGICAL HISTORY: Past Surgical History:  Procedure Laterality Date  . ABDOMINAL HYSTERECTOMY    . COLONOSCOPY    . MASTECTOMY W/ SENTINEL NODE BIOPSY Right 08/26/2016   Procedure: RIGHT MASTECTOMY WITH SENTINEL LYMPH NODE BIOPSY;  Surgeon: Autumn Messing III, MD;  Location: [facility];  Service: General;  Laterality: Right;  . PORTACATH PLACEMENT Left 08/26/2016   Procedure: INSERTION PORT-A-CATH;  Surgeon: Autumn Messing III, MD;  Location: [facility];  Service: General;  Laterality: Left;  . TUBAL LIGATION    . WISDOM TOOTH EXTRACTION      SOCIAL HISTORY: Social History   Social History  . Marital status: Divorced    Spouse name: N/A  . Number of children: N/A  . Years of education: N/A   Occupational History  . Not on file.   Social History Main Topics  . Smoking status: Never Smoker  . Smokeless tobacco: Never Used  . Alcohol use No  . Drug use: No  . Sexual activity: Not on file   Other Topics Concern  . Not on file   Social History Narrative  . No narrative on file    FAMILY HISTORY: Family History  Problem Relation Age of Onset  . Breast cancer Cousin        mat first cousin; dx in her late 48s to early 23s  . Stroke Mother   . Prostate cancer Father   . Pancreatic cancer Sister 34  . Sickle cell trait Brother   . Prostate cancer Maternal Uncle   . Breast cancer Paternal Aunt        dx over 66  . Leukemia Maternal Grandmother   . Heart attack Paternal Grandmother   . Heart attack Paternal Grandfather   . Sickle cell trait Sister   . Sickle cell anemia Other   . Colon polyps Sister   . Breast cancer Cousin        paternal first cousin died in her 74s  . Breast cancer Cousin        pat first cousin died in her 42s  . Breast cancer Cousin         pat first cousin dx over 30    Review of Systems  Constitutional: Negative for appetite change, chills, fatigue, fever and unexpected weight change.  HENT:   Negative for hearing loss and lump/mass.   Eyes: Negative for eye problems and icterus.  Respiratory: Negative for chest tightness, cough and shortness of breath.   Cardiovascular: Negative for chest pain, leg swelling and palpitations.  Gastrointestinal: Negative for abdominal distention, abdominal pain, constipation, diarrhea, nausea and vomiting.  Endocrine: Negative for hot flashes.  Genitourinary: Negative for difficulty urinating.   Musculoskeletal: Negative for arthralgias and neck pain.  Skin: Negative for itching and rash.  Neurological: Positive for numbness. Negative for dizziness, extremity weakness and headaches.  Hematological: Negative for adenopathy.  Does not bruise/bleed easily.  Psychiatric/Behavioral: Negative for depression. The patient is not nervous/anxious.       PHYSICAL EXAMINATION  ECOG PERFORMANCE STATUS: 1 - Symptomatic but completely ambulatory  Vitals:   02/05/17 0953  BP: (!) 154/68  Pulse: 83  Resp: 18  Temp: 98.4 F (36.9 C)    Physical Exam  Constitutional: She is oriented to person, place, and time and well-developed, well-nourished, and in no distress.  HENT:  Head: Normocephalic.  Mouth/Throat: Oropharynx is clear and moist. No oropharyngeal exudate.  Eyes: Pupils are equal, round, and reactive to light. No scleral icterus.  Neck: Neck supple.  Cardiovascular: Normal rate, regular rhythm and normal heart sounds.   Pulmonary/Chest: Effort normal and breath sounds normal. No respiratory distress. She has no wheezes. She has no rales.  Abdominal: Soft. Bowel sounds are normal. She exhibits no distension. There is no tenderness.  Musculoskeletal: She exhibits no edema.  Lymphadenopathy:    She has no cervical adenopathy.  Neurological: She is alert and oriented to person,  place, and time.  Skin: Skin is warm and dry. No rash noted.  Psychiatric: Mood and affect normal.    LABORATORY DATA:  CBC    Component Value Date/Time   WBC 4.2 02/05/2017 0926   WBC 9.0 08/21/2016 1333   RBC 3.64 (L) 02/05/2017 0926   RBC 4.56 08/21/2016 1333   HGB 10.4 (L) 02/05/2017 0926   HCT 31.6 (L) 02/05/2017 0926   PLT 251 02/05/2017 0926   MCV 86.8 02/05/2017 0926   MCH 28.6 02/05/2017 0926   MCH 27.4 08/21/2016 1333   MCHC 32.9 02/05/2017 0926   MCHC 32.8 08/21/2016 1333   RDW 14.7 (H) 02/05/2017 0926   LYMPHSABS 0.7 (L) 02/05/2017 0926   MONOABS 0.6 02/05/2017 0926   EOSABS 0.3 02/05/2017 0926   BASOSABS 0.0 02/05/2017 0926    CMP     Component Value Date/Time   NA 137 02/05/2017 0926   K 3.7 02/05/2017 0926   CL 101 08/21/2016 1333   CO2 26 02/05/2017 0926   GLUCOSE 112 02/05/2017 0926   BUN 15.3 02/05/2017 0926   CREATININE 0.7 02/05/2017 0926   CALCIUM 9.7 02/05/2017 0926   PROT 7.3 02/05/2017 0926   ALBUMIN 3.8 02/05/2017 0926   AST 13 02/05/2017 0926   ALT 12 02/05/2017 0926   ALKPHOS 64 02/05/2017 0926   BILITOT 0.40 02/05/2017 0926   GFRNONAA >60 08/21/2016 1333   GFRAA >60 08/21/2016 1333       ASSESSMENT and PLAN:   Breast cancer of upper-outer quadrant of right female breast ([place]) 07/24/2016: Palpable right breast mass for 2 months UOQ at 10:00: 4.7 x 4.1 x 3.7 cm with enlarged axillary lymph node biopsy benign, breast mass biopsy grade 3 IDC ER 30% week PR negative HER-2 negative Ki-67 90% CT CAP and Bone scan: No mets  Treatment plan based on multidisciplinary tumor board: 1. Right mastectomy01/24/2018: IDC grade 3, 7 cm, margins negative, 1/17 nodes positive, ER weakly +30%, PR negative, HER-2 negative, Ki-67 90%, T3 N1 stage IIIa 2. Adjuvant chemotherapy with Adriamycin and Cytoxan dose dense 4 followed by Abraxane weekly 12 (Patient cannot state steroids due to diabetes and so we are not using Taxol) 3. Followed by  adjuvant radiation therapy 4. Followed by adjuvant antiestrogen therapy -------------------------------------------------------------------------------------------------------------------------- Current treatment: Completed 4 cycles ofdose dense Adriamycin Cytoxan, today is cycle 12Abraxane Echocardiogram 09/16/2016: EF 60-65 %  Chemotherapy toxicities: 1. Fatigue due to chemotherapy 2. nausea grade 1 3.  Anemia: Being monitored 4. 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 Symptoms are stable  5. Nail dyscrasia  Renleigh will finish chemotherapy today!  She is very excited, however is slightly nervous about radiation.  She will undergo simulation in 2 weeks. We will send a message to Dr. Marlou Starks about port removal.    Aryaa will return in about 8-10 weeks (following radiation) for follow up with Dr. Lindi Adie.       All questions were answered. The patient knows to call the clinic with any problems, questions or concerns. We can certainly see the patient much sooner if necessary.  A total of (30) minutes of face-to-face time was spent with this patient with greater than 50% of that time in counseling and care-coordination.  This note was electronically signed. Scot Dock, NP 02/05/2017

## 2017-02-04 NOTE — Progress Notes (Signed)
Please see the Nurse Progress Note in the MD Initial Consult Encounter for this patient. 

## 2017-02-05 ENCOUNTER — Encounter: Payer: Self-pay | Admitting: Adult Health

## 2017-02-05 ENCOUNTER — Ambulatory Visit (HOSPITAL_BASED_OUTPATIENT_CLINIC_OR_DEPARTMENT_OTHER): Payer: Medicare Other | Admitting: Adult Health

## 2017-02-05 ENCOUNTER — Ambulatory Visit: Payer: Medicare Other

## 2017-02-05 ENCOUNTER — Ambulatory Visit (HOSPITAL_BASED_OUTPATIENT_CLINIC_OR_DEPARTMENT_OTHER): Payer: Medicare Other

## 2017-02-05 ENCOUNTER — Other Ambulatory Visit (HOSPITAL_BASED_OUTPATIENT_CLINIC_OR_DEPARTMENT_OTHER): Payer: Medicare Other

## 2017-02-05 DIAGNOSIS — C50411 Malignant neoplasm of upper-outer quadrant of right female breast: Secondary | ICD-10-CM

## 2017-02-05 DIAGNOSIS — Z5111 Encounter for antineoplastic chemotherapy: Secondary | ICD-10-CM

## 2017-02-05 DIAGNOSIS — G620 Drug-induced polyneuropathy: Secondary | ICD-10-CM | POA: Diagnosis not present

## 2017-02-05 DIAGNOSIS — Z170 Estrogen receptor positive status [ER+]: Secondary | ICD-10-CM

## 2017-02-05 DIAGNOSIS — L608 Other nail disorders: Secondary | ICD-10-CM

## 2017-02-05 DIAGNOSIS — Z95828 Presence of other vascular implants and grafts: Secondary | ICD-10-CM

## 2017-02-05 DIAGNOSIS — D649 Anemia, unspecified: Secondary | ICD-10-CM

## 2017-02-05 LAB — CBC WITH DIFFERENTIAL/PLATELET
BASO%: 0.7 % (ref 0.0–2.0)
Basophils Absolute: 0 10*3/uL (ref 0.0–0.1)
EOS%: 7.2 % — AB (ref 0.0–7.0)
Eosinophils Absolute: 0.3 10*3/uL (ref 0.0–0.5)
HEMATOCRIT: 31.6 % — AB (ref 34.8–46.6)
HEMOGLOBIN: 10.4 g/dL — AB (ref 11.6–15.9)
LYMPH#: 0.7 10*3/uL — AB (ref 0.9–3.3)
LYMPH%: 16.5 % (ref 14.0–49.7)
MCH: 28.6 pg (ref 25.1–34.0)
MCHC: 32.9 g/dL (ref 31.5–36.0)
MCV: 86.8 fL (ref 79.5–101.0)
MONO#: 0.6 10*3/uL (ref 0.1–0.9)
MONO%: 14.8 % — ABNORMAL HIGH (ref 0.0–14.0)
NEUT%: 60.8 % (ref 38.4–76.8)
NEUTROS ABS: 2.6 10*3/uL (ref 1.5–6.5)
PLATELETS: 251 10*3/uL (ref 145–400)
RBC: 3.64 10*6/uL — ABNORMAL LOW (ref 3.70–5.45)
RDW: 14.7 % — AB (ref 11.2–14.5)
WBC: 4.2 10*3/uL (ref 3.9–10.3)

## 2017-02-05 LAB — COMPREHENSIVE METABOLIC PANEL
ALT: 12 U/L (ref 0–55)
AST: 13 U/L (ref 5–34)
Albumin: 3.8 g/dL (ref 3.5–5.0)
Alkaline Phosphatase: 64 U/L (ref 40–150)
Anion Gap: 7 mEq/L (ref 3–11)
BILIRUBIN TOTAL: 0.4 mg/dL (ref 0.20–1.20)
BUN: 15.3 mg/dL (ref 7.0–26.0)
CALCIUM: 9.7 mg/dL (ref 8.4–10.4)
CHLORIDE: 104 meq/L (ref 98–109)
CO2: 26 meq/L (ref 22–29)
CREATININE: 0.7 mg/dL (ref 0.6–1.1)
EGFR: >90 mL/min/{1.73_m2} (ref >90)
Glucose: 112 mg/dl (ref 70–140)
Potassium: 3.7 mEq/L (ref 3.5–5.1)
Sodium: 137 mEq/L (ref 136–145)
TOTAL PROTEIN: 7.3 g/dL (ref 6.4–8.3)

## 2017-02-05 MED ORDER — SODIUM CHLORIDE 0.9 % IV SOLN
Freq: Once | INTRAVENOUS | Status: AC
  Administered 2017-02-05: 11:00:00 via INTRAVENOUS

## 2017-02-05 MED ORDER — HEPARIN SOD (PORK) LOCK FLUSH 100 UNIT/ML IV SOLN
500.0000 [IU] | Freq: Once | INTRAVENOUS | Status: AC | PRN
  Administered 2017-02-05: 500 [IU]
  Filled 2017-02-05: qty 5

## 2017-02-05 MED ORDER — SODIUM CHLORIDE 0.9% FLUSH
10.0000 mL | INTRAVENOUS | Status: DC | PRN
  Administered 2017-02-05: 10 mL via INTRAVENOUS
  Filled 2017-02-05: qty 10

## 2017-02-05 MED ORDER — PACLITAXEL PROTEIN-BOUND CHEMO INJECTION 100 MG
65.0000 mg/m2 | Freq: Once | INTRAVENOUS | Status: AC
  Administered 2017-02-05: 125 mg via INTRAVENOUS
  Filled 2017-02-05: qty 25

## 2017-02-05 MED ORDER — ONDANSETRON HCL 4 MG/2ML IJ SOLN
8.0000 mg | Freq: Once | INTRAMUSCULAR | Status: AC
Start: 2017-02-05 — End: 2017-02-05
  Administered 2017-02-05: 8 mg via INTRAVENOUS

## 2017-02-05 MED ORDER — SODIUM CHLORIDE 0.9% FLUSH
10.0000 mL | INTRAVENOUS | Status: DC | PRN
  Administered 2017-02-05: 10 mL
  Filled 2017-02-05: qty 10

## 2017-02-05 MED ORDER — ONDANSETRON HCL 4 MG/2ML IJ SOLN
INTRAMUSCULAR | Status: AC
  Filled 2017-02-05: qty 4

## 2017-02-05 NOTE — Patient Instructions (Signed)

## 2017-02-05 NOTE — Patient Instructions (Signed)
[facility] Discharge Instructions for Patients Receiving Chemotherapy  Today you received the following chemotherapy agents ABRAXANE.  To help prevent nausea and vomiting after your treatment, we encourage you to take your nausea medication as prescribed.    If you develop nausea and vomiting that is not controlled by your nausea medication, call the clinic.   BELOW ARE SYMPTOMS THAT SHOULD BE REPORTED IMMEDIATELY:  *FEVER GREATER THAN 100.5 F  *CHILLS WITH OR WITHOUT FEVER  NAUSEA AND VOMITING THAT IS NOT CONTROLLED WITH YOUR NAUSEA MEDICATION  *UNUSUAL SHORTNESS OF BREATH  *UNUSUAL BRUISING OR BLEEDING  TENDERNESS IN MOUTH AND THROAT WITH OR WITHOUT PRESENCE OF ULCERS  *URINARY PROBLEMS  *BOWEL PROBLEMS  UNUSUAL RASH Items with * indicate a potential emergency and should be followed up as soon as possible.  Feel free to call the clinic you have any questions or concerns. The clinic phone number is [phone].  Please show the [facility] at check-in to the Emergency Department and triage nurse.

## 2017-02-25 ENCOUNTER — Ambulatory Visit
Admission: RE | Admit: 2017-02-25 | Discharge: 2017-02-25 | Disposition: A | Discharge status: Home / Self Care | Payer: Medicare Other | Source: Ambulatory Visit | Attending: Radiation Oncology | Admitting: Radiation Oncology

## 2017-02-25 DIAGNOSIS — C50411 Malignant neoplasm of upper-outer quadrant of right female breast: Secondary | ICD-10-CM

## 2017-02-25 DIAGNOSIS — Z510 Encounter for antineoplastic radiation therapy: Secondary | ICD-10-CM | POA: Diagnosis not present

## 2017-02-25 DIAGNOSIS — Z170 Estrogen receptor positive status [ER+]: Principal | ICD-10-CM

## 2017-03-02 NOTE — Progress Notes (Signed)
  Radiation Oncology         [phone]) [phone] ________________________________  Name: Casey Olson MRN: [id number]  Date: 02/25/2017  DOB: [birth year]  DIAGNOSIS:     ICD-10-CM   1. Malignant neoplasm of upper-outer quadrant of right breast in female, estrogen receptor positive ([place]) C50.411    Z17.0      SIMULATION AND TREATMENT PLANNING NOTE  The patient presented for simulation prior to beginning her course of radiation treatment for her diagnosis of right-sided breast cancer. The patient was placed in a supine position on a breast board. A customized vac-lock bag was also constructed and this complex treatment device will be used on a daily basis during her treatment. In this fashion, a CT scan was obtained through the chest area and an isocenter was placed near the chest wall at the upper aspect of the right chest.  The patient will be planned to receive a course of radiation initially to a dose of 50.4 gray. This will consist of a 4 field technique targeting the right chest wall as well as the supraclavicular region. Therefore 2 customized medial and lateral tangent fields have been created targeting the chest wall, and also 2 additional customized fields have been designed to treat the supraclavicular region both with a right supraclavicular field and a right posterior axillary boost field. A forward planning/reduced field technique will also be evaluated to determine if this significantly improves the dose homogeneity of the overall plan. Therefore, additional customized blocks/fields may be necessary.  This initial treatment will be accomplished at 1.8 gray per fraction.   The initial plan will consist of a 3-D conformal technique. The target volume/scar, heart and lungs have been contoured and dose volume histograms of each of these structures will be evaluated as part of the 3-D conformal treatment planning process.   It is anticipated that the patient will then receive a 10 gray boost  to the surgical scar. This will be accomplished at 2 gray per fraction. The final anticipated total dose therefore will correspond to 60.4 gray.    _______________________________   Jodelle Gross, MD, PhD

## 2017-03-04 ENCOUNTER — Ambulatory Visit
Admission: RE | Admit: 2017-03-04 | Discharge: 2017-03-04 | Disposition: A | Discharge status: Home / Self Care | Payer: Medicare Other | Source: Ambulatory Visit | Attending: Radiation Oncology | Admitting: Radiation Oncology

## 2017-03-04 DIAGNOSIS — Z510 Encounter for antineoplastic radiation therapy: Secondary | ICD-10-CM | POA: Diagnosis not present

## 2017-03-08 ENCOUNTER — Ambulatory Visit
Admission: RE | Admit: 2017-03-08 | Discharge: 2017-03-08 | Disposition: A | Discharge status: Home / Self Care | Payer: Medicare Other | Source: Ambulatory Visit | Attending: Radiation Oncology | Admitting: Radiation Oncology

## 2017-03-08 DIAGNOSIS — Z510 Encounter for antineoplastic radiation therapy: Secondary | ICD-10-CM | POA: Diagnosis not present

## 2017-03-09 ENCOUNTER — Ambulatory Visit
Admission: RE | Admit: 2017-03-09 | Discharge: 2017-03-09 | Disposition: A | Discharge status: Home / Self Care | Payer: Medicare Other | Source: Ambulatory Visit | Attending: Radiation Oncology | Admitting: Radiation Oncology

## 2017-03-09 DIAGNOSIS — C50411 Malignant neoplasm of upper-outer quadrant of right female breast: Secondary | ICD-10-CM

## 2017-03-09 DIAGNOSIS — Z170 Estrogen receptor positive status [ER+]: Principal | ICD-10-CM

## 2017-03-09 DIAGNOSIS — Z510 Encounter for antineoplastic radiation therapy: Secondary | ICD-10-CM | POA: Diagnosis not present

## 2017-03-09 MED ORDER — ALRA NON-METALLIC DEODORANT (RAD-ONC)
1.0000 "application " | Freq: Once | TOPICAL | Status: AC
  Administered 2017-03-09: 1 via TOPICAL

## 2017-03-09 MED ORDER — RADIAPLEXRX EX GEL
Freq: Once | CUTANEOUS | Status: AC
  Administered 2017-03-09: 13:00:00 via TOPICAL

## 2017-03-09 NOTE — Progress Notes (Signed)
Pt education done, Radiation therapy and you book, my business card, alra deodorant,radiaplexgel given, discussed side effects (breast/chest wall)such as skin irritation, swelling, pain, fatigue, alra deodoeant apply daily after rad tx and prn, radiaplex gel apply to breast/chest wall after rad tx daily and at bedtime, luke warm shower/bath, no rubbing,scrubbing or scratching treated area, pat dry, no skin priooducats applied 4 hours prior to radf txs, increase protein in diet, stay hydrated, drink plenty water, sees MD weekly and prn Teach back given 12:43 PM

## 2017-03-10 ENCOUNTER — Ambulatory Visit
Admission: RE | Admit: 2017-03-10 | Discharge: 2017-03-10 | Disposition: A | Discharge status: Home / Self Care | Payer: Medicare Other | Source: Ambulatory Visit | Attending: Radiation Oncology | Admitting: Radiation Oncology

## 2017-03-10 DIAGNOSIS — Z510 Encounter for antineoplastic radiation therapy: Secondary | ICD-10-CM | POA: Diagnosis not present

## 2017-03-11 ENCOUNTER — Ambulatory Visit
Admission: RE | Admit: 2017-03-11 | Discharge: 2017-03-11 | Disposition: A | Discharge status: Home / Self Care | Payer: Medicare Other | Source: Ambulatory Visit | Attending: Radiation Oncology | Admitting: Radiation Oncology

## 2017-03-11 DIAGNOSIS — Z510 Encounter for antineoplastic radiation therapy: Secondary | ICD-10-CM | POA: Diagnosis not present

## 2017-03-12 ENCOUNTER — Ambulatory Visit
Admission: RE | Admit: 2017-03-12 | Discharge: 2017-03-12 | Disposition: A | Discharge status: Home / Self Care | Payer: Medicare Other | Source: Ambulatory Visit | Attending: Radiation Oncology | Admitting: Radiation Oncology

## 2017-03-12 DIAGNOSIS — Z510 Encounter for antineoplastic radiation therapy: Secondary | ICD-10-CM | POA: Diagnosis not present

## 2017-03-15 ENCOUNTER — Ambulatory Visit
Admission: RE | Admit: 2017-03-15 | Discharge: 2017-03-15 | Disposition: A | Discharge status: Home / Self Care | Payer: Medicare Other | Source: Ambulatory Visit | Attending: Radiation Oncology | Admitting: Radiation Oncology

## 2017-03-15 DIAGNOSIS — Z510 Encounter for antineoplastic radiation therapy: Secondary | ICD-10-CM | POA: Diagnosis not present

## 2017-03-16 ENCOUNTER — Ambulatory Visit
Admission: RE | Admit: 2017-03-16 | Discharge: 2017-03-16 | Disposition: A | Discharge status: Home / Self Care | Payer: Medicare Other | Source: Ambulatory Visit | Attending: Radiation Oncology | Admitting: Radiation Oncology

## 2017-03-16 DIAGNOSIS — Z510 Encounter for antineoplastic radiation therapy: Secondary | ICD-10-CM | POA: Diagnosis not present

## 2017-03-17 ENCOUNTER — Ambulatory Visit
Admission: RE | Admit: 2017-03-17 | Discharge: 2017-03-17 | Disposition: A | Discharge status: Home / Self Care | Payer: Medicare Other | Source: Ambulatory Visit | Attending: Radiation Oncology | Admitting: Radiation Oncology

## 2017-03-17 DIAGNOSIS — Z510 Encounter for antineoplastic radiation therapy: Secondary | ICD-10-CM | POA: Diagnosis not present

## 2017-03-18 ENCOUNTER — Ambulatory Visit
Admission: RE | Admit: 2017-03-18 | Discharge: 2017-03-18 | Disposition: A | Discharge status: Home / Self Care | Payer: Medicare Other | Source: Ambulatory Visit | Attending: Radiation Oncology | Admitting: Radiation Oncology

## 2017-03-18 DIAGNOSIS — Z510 Encounter for antineoplastic radiation therapy: Secondary | ICD-10-CM | POA: Diagnosis not present

## 2017-03-19 ENCOUNTER — Ambulatory Visit
Admission: RE | Admit: 2017-03-19 | Discharge: 2017-03-19 | Disposition: A | Discharge status: Home / Self Care | Payer: Medicare Other | Source: Ambulatory Visit | Attending: Radiation Oncology | Admitting: Radiation Oncology

## 2017-03-19 DIAGNOSIS — Z510 Encounter for antineoplastic radiation therapy: Secondary | ICD-10-CM | POA: Diagnosis not present

## 2017-03-22 ENCOUNTER — Ambulatory Visit
Admission: RE | Admit: 2017-03-22 | Discharge: 2017-03-22 | Disposition: A | Discharge status: Home / Self Care | Payer: Medicare Other | Source: Ambulatory Visit | Attending: Radiation Oncology | Admitting: Radiation Oncology

## 2017-03-22 DIAGNOSIS — Z510 Encounter for antineoplastic radiation therapy: Secondary | ICD-10-CM | POA: Diagnosis not present

## 2017-03-23 ENCOUNTER — Ambulatory Visit
Admission: RE | Admit: 2017-03-23 | Discharge: 2017-03-23 | Disposition: A | Discharge status: Home / Self Care | Payer: Medicare Other | Source: Ambulatory Visit | Attending: Radiation Oncology | Admitting: Radiation Oncology

## 2017-03-23 DIAGNOSIS — Z510 Encounter for antineoplastic radiation therapy: Secondary | ICD-10-CM | POA: Diagnosis not present

## 2017-03-24 ENCOUNTER — Ambulatory Visit
Admission: RE | Admit: 2017-03-24 | Discharge: 2017-03-24 | Disposition: A | Discharge status: Home / Self Care | Payer: Medicare Other | Source: Ambulatory Visit | Attending: Radiation Oncology | Admitting: Radiation Oncology

## 2017-03-24 DIAGNOSIS — Z510 Encounter for antineoplastic radiation therapy: Secondary | ICD-10-CM | POA: Diagnosis not present

## 2017-03-25 ENCOUNTER — Ambulatory Visit
Admission: RE | Admit: 2017-03-25 | Discharge: 2017-03-25 | Disposition: A | Discharge status: Home / Self Care | Payer: Medicare Other | Source: Ambulatory Visit | Attending: Radiation Oncology | Admitting: Radiation Oncology

## 2017-03-25 ENCOUNTER — Encounter: Payer: Self-pay | Admitting: *Deleted

## 2017-03-25 DIAGNOSIS — Z510 Encounter for antineoplastic radiation therapy: Secondary | ICD-10-CM | POA: Diagnosis not present

## 2017-03-26 ENCOUNTER — Ambulatory Visit
Admission: RE | Admit: 2017-03-26 | Discharge: 2017-03-26 | Disposition: A | Discharge status: Home / Self Care | Payer: Medicare Other | Source: Ambulatory Visit | Attending: Radiation Oncology | Admitting: Radiation Oncology

## 2017-03-26 DIAGNOSIS — Z510 Encounter for antineoplastic radiation therapy: Secondary | ICD-10-CM | POA: Diagnosis not present

## 2017-03-29 ENCOUNTER — Ambulatory Visit
Admission: RE | Admit: 2017-03-29 | Discharge: 2017-03-29 | Disposition: A | Discharge status: Home / Self Care | Payer: Medicare Other | Source: Ambulatory Visit | Attending: Radiation Oncology | Admitting: Radiation Oncology

## 2017-03-29 DIAGNOSIS — Z510 Encounter for antineoplastic radiation therapy: Secondary | ICD-10-CM | POA: Diagnosis not present

## 2017-03-30 ENCOUNTER — Ambulatory Visit
Admission: RE | Admit: 2017-03-30 | Discharge: 2017-03-30 | Disposition: A | Discharge status: Home / Self Care | Payer: Medicare Other | Source: Ambulatory Visit | Attending: Radiation Oncology | Admitting: Radiation Oncology

## 2017-03-30 DIAGNOSIS — Z510 Encounter for antineoplastic radiation therapy: Secondary | ICD-10-CM | POA: Diagnosis not present

## 2017-03-31 ENCOUNTER — Ambulatory Visit
Admission: RE | Admit: 2017-03-31 | Discharge: 2017-03-31 | Disposition: A | Discharge status: Home / Self Care | Payer: Medicare Other | Source: Ambulatory Visit | Attending: Radiation Oncology | Admitting: Radiation Oncology

## 2017-03-31 DIAGNOSIS — Z510 Encounter for antineoplastic radiation therapy: Secondary | ICD-10-CM | POA: Diagnosis not present

## 2017-04-01 ENCOUNTER — Ambulatory Visit
Admission: RE | Admit: 2017-04-01 | Discharge: 2017-04-01 | Disposition: A | Discharge status: Home / Self Care | Payer: Medicare Other | Source: Ambulatory Visit | Attending: Radiation Oncology | Admitting: Radiation Oncology

## 2017-04-01 DIAGNOSIS — Z510 Encounter for antineoplastic radiation therapy: Secondary | ICD-10-CM | POA: Diagnosis not present

## 2017-04-02 ENCOUNTER — Encounter: Payer: Self-pay | Admitting: Hematology and Oncology

## 2017-04-02 ENCOUNTER — Ambulatory Visit (HOSPITAL_BASED_OUTPATIENT_CLINIC_OR_DEPARTMENT_OTHER): Payer: Medicare Other | Admitting: Hematology and Oncology

## 2017-04-02 ENCOUNTER — Ambulatory Visit (HOSPITAL_BASED_OUTPATIENT_CLINIC_OR_DEPARTMENT_OTHER): Payer: Medicare Other

## 2017-04-02 ENCOUNTER — Other Ambulatory Visit (HOSPITAL_BASED_OUTPATIENT_CLINIC_OR_DEPARTMENT_OTHER): Payer: Medicare Other

## 2017-04-02 ENCOUNTER — Ambulatory Visit
Admission: RE | Admit: 2017-04-02 | Discharge: 2017-04-02 | Disposition: A | Discharge status: Home / Self Care | Payer: Medicare Other | Source: Ambulatory Visit | Attending: Radiation Oncology | Admitting: Radiation Oncology

## 2017-04-02 ENCOUNTER — Ambulatory Visit: Payer: Medicare Other | Admitting: Hematology and Oncology

## 2017-04-02 ENCOUNTER — Other Ambulatory Visit: Payer: Medicare Other

## 2017-04-02 DIAGNOSIS — Z170 Estrogen receptor positive status [ER+]: Principal | ICD-10-CM

## 2017-04-02 DIAGNOSIS — C50411 Malignant neoplasm of upper-outer quadrant of right female breast: Secondary | ICD-10-CM

## 2017-04-02 DIAGNOSIS — Z95828 Presence of other vascular implants and grafts: Secondary | ICD-10-CM

## 2017-04-02 DIAGNOSIS — Z510 Encounter for antineoplastic radiation therapy: Secondary | ICD-10-CM | POA: Diagnosis not present

## 2017-04-02 LAB — CBC WITH DIFFERENTIAL/PLATELET
BASO%: 0.4 % (ref 0.0–2.0)
Basophils Absolute: 0 10*3/uL (ref 0.0–0.1)
EOS ABS: 0.4 10*3/uL (ref 0.0–0.5)
EOS%: 8.3 % — ABNORMAL HIGH (ref 0.0–7.0)
HEMATOCRIT: 35.3 % (ref 34.8–46.6)
HGB: 11.5 g/dL — ABNORMAL LOW (ref 11.6–15.9)
LYMPH%: 9.7 % — AB (ref 14.0–49.7)
MCH: 27.6 pg (ref 25.1–34.0)
MCHC: 32.6 g/dL (ref 31.5–36.0)
MCV: 84.7 fL (ref 79.5–101.0)
MONO#: 1 10*3/uL — ABNORMAL HIGH (ref 0.1–0.9)
MONO%: 20 % — ABNORMAL HIGH (ref 0.0–14.0)
NEUT%: 61.6 % (ref 38.4–76.8)
NEUTROS ABS: 3.2 10*3/uL (ref 1.5–6.5)
PLATELETS: 214 10*3/uL (ref 145–400)
RBC: 4.17 10*6/uL (ref 3.70–5.45)
RDW: 15.8 % — ABNORMAL HIGH (ref 11.2–14.5)
WBC: 5.2 10*3/uL (ref 3.9–10.3)
lymph#: 0.5 10*3/uL — ABNORMAL LOW (ref 0.9–3.3)

## 2017-04-02 LAB — COMPREHENSIVE METABOLIC PANEL
ALBUMIN: 3.7 g/dL (ref 3.5–5.0)
ALK PHOS: 55 U/L (ref 40–150)
ALT: 10 U/L (ref 0–55)
ANION GAP: 8 meq/L (ref 3–11)
AST: 12 U/L (ref 5–34)
BILIRUBIN TOTAL: 0.45 mg/dL (ref 0.20–1.20)
BUN: 11.6 mg/dL (ref 7.0–26.0)
CALCIUM: 10 mg/dL (ref 8.4–10.4)
CO2: 28 meq/L (ref 22–29)
Chloride: 105 mEq/L (ref 98–109)
Creatinine: 0.6 mg/dL (ref 0.6–1.1)
Glucose: 49 mg/dl — ABNORMAL LOW (ref 70–140)
Potassium: 3.4 mEq/L — ABNORMAL LOW (ref 3.5–5.1)
Sodium: 141 mEq/L (ref 136–145)
TOTAL PROTEIN: 7.4 g/dL (ref 6.4–8.3)

## 2017-04-02 MED ORDER — HEPARIN SOD (PORK) LOCK FLUSH 100 UNIT/ML IV SOLN
500.0000 [IU] | Freq: Once | INTRAVENOUS | Status: AC | PRN
  Administered 2017-04-02: 500 [IU] via INTRAVENOUS
  Filled 2017-04-02: qty 5

## 2017-04-02 MED ORDER — SODIUM CHLORIDE 0.9% FLUSH
10.0000 mL | INTRAVENOUS | Status: DC | PRN
  Administered 2017-04-02: 10 mL via INTRAVENOUS
  Filled 2017-04-02: qty 10

## 2017-04-02 MED ORDER — ANASTROZOLE 1 MG PO TABS: 1.0000 mg | ORAL_TABLET | Freq: Every day | ORAL | 3 refills | Status: DC

## 2017-04-02 NOTE — Assessment & Plan Note (Addendum)
07/24/2016: Palpable right breast mass for 2 months UOQ at 10:00: 4.7 x 4.1 x 3.7 cm with enlarged axillary lymph node biopsy benign, breast mass biopsy grade 3 IDC ER 30% week PR negative HER-2 negative Ki-67 90% CT CAP and Bone scan: No mets  Treatment summary  1. Right mastectomy01/24/2018: IDC grade 3, 7 cm, margins negative, 1/17 nodes positive, ER weakly +30%, PR negative, HER-2 negative, Ki-67 90%, T3 N1 stage IIIa 2. Adjuvant chemotherapy with Adriamycin and Cytoxan dose dense 4 followed by Abraxane weekly 12 (Patient cannot state steroids due to diabetes and so we are not using Taxol) started February 2018 completed June 2018 3. Adjuvant radiation therapy started 03/08/2017 completed 04/02/2017 4. Followed by adjuvant antiestrogen therapy started 04/08/2017 -------------------------------------------------------------------------------------------------------------------------- Antiestrogen therapy counseling: I recommended using anastrozole 1 mg daily. We discussed the risks and benefits of anti-estrogen therapy with aromatase inhibitors. These include but not limited to insomnia, hot flashes, mood changes, vaginal dryness, bone density loss, and weight gain. We strongly believe that the benefits far outweigh the risks. Patient understands these risks and consented to starting treatment. Planned treatment duration is 7 years.  Return to clinic in 3 months for toxicity evaluation as well as for survivorship care plan visit

## 2017-04-02 NOTE — Progress Notes (Signed)
Patient Care Team: Nolene Ebbs, MD as PCP - General (Internal Medicine) Jovita Kussmaul, MD as Consulting Physician (General Surgery) Nicholas Lose, MD as Consulting Physician (Hematology and Oncology) Kyung Rudd, MD as Consulting Physician (Radiation Oncology) Delice Bison Charlestine Massed, NP as Nurse Practitioner (Hematology and Oncology)  DIAGNOSIS:  Encounter Diagnosis  Name Primary?  . Malignant neoplasm of upper-outer quadrant of right breast in female, estrogen receptor positive ([place])     SUMMARY OF ONCOLOGIC HISTORY:   Malignant neoplasm of upper-outer quadrant of right breast in female, estrogen receptor positive ([place])   08/05/2016 Initial Diagnosis    Malignant neoplasm of upper-outer quadrant of right breast in female, estrogen receptor positive ([place])     09/25/2016 - 01/06/2017 Chemotherapy    Adriamycin and Cytoxan dose dense 4 followed by Abraxane weekly 12      12/03/2016 Genetic Testing    APC c.2213A>G and POLE c.1064A>G VUS found on the Common Hereditary cancer panel.  The Hereditary Gene Panel offered by [facility] includes sequencing and/or deletion duplication testing of the following 46 genes: APC, ATM, AXIN2, BARD1, BMPR1A, BRCA1, BRCA2, BRIP1, CDH1, CDKN2A (p14ARF), CDKN2A (p16INK4a), CHEK2, CTNNA1, DICER1, EPCAM (Deletion/duplication testing only), GREM1 (promoter region deletion/duplication testing only), KIT, MEN1, MLH1, MSH2, MSH3, MSH6, MUTYH, NBN, NF1, NHTL78mPALB2, PDGFRA, PMS2, POLD1, POLE, PTEN, RAD50, RAD51C, RAD51D, SDHB, SDHC, SDHD, SMAD4, SMARCA4. STK11, TP53, TSC1, TSC2, and VHL.  The following gene was evaluated for sequence changes only: SDHA and HOXB13 c.251G>A variant only.  The report date is Dec 03, 2016.       03/08/2017 - 04/02/2017 Radiation Therapy    Adjuvant radiation therapy       CHIEF COMPLIANT: Follow-up on radiation therapy  INTERVAL HISTORY: Casey Bessetteis a 66year old with above-mentioned history of right breast cancer was  treated with surgery followed by chemotherapy and is currently on radiation. She is tolerated radiation fairly well. She reports no major problems or concerns. She does have some dryness of the skin and itching sensation related to the dryness. She still has to one half weeks more of the radiation left.  REVIEW OF SYSTEMS:   Constitutional: Denies fevers, chills or abnormal weight loss Eyes: Denies blurriness of vision Ears, nose, mouth, throat, and face: Denies mucositis or sore throat Respiratory: Denies cough, dyspnea or wheezes Cardiovascular: Denies palpitation, chest discomfort Gastrointestinal:  Denies nausea, heartburn or change in bowel habits Skin: Denies abnormal skin rashes Lymphatics: Denies new lymphadenopathy or easy bruising Neurological:Denies numbness, tingling or new weaknesses Behavioral/Psych: Mood is stable, no new changes  Extremities: No lower extremity edema  All other systems were reviewed with the patient and are negative.  I have reviewed the past medical history, past surgical history, social history and family history with the patient and they are unchanged from previous note.  ALLERGIES:  is allergic to percocet [oxycodone-acetaminophen].  MEDICATIONS:  Current Outpatient Prescriptions  Medication Sig Dispense Refill  . acetaminophen (TYLENOL) 325 MG tablet Take 650 mg by mouth every 6 (six) hours as needed for mild pain.    .Marland Kitchenaspirin EC 81 MG tablet Take 81 mg by mouth daily.    . BD PEN NEEDLE NANO U/F 32G X 4 MM MISC USE TO INJECT INSULIN EVERY DAY (E11.65)  5  . Blood Glucose Monitoring Suppl (ONETOUCH VERIO FLEX SYSTEM) Casey/Device KIT USE TO TEST 3 TIMES DAILY DX E11.65  11  . Calcium Carbonate-Vitamin D (CALTRATE 600+D PO) Take 1 tablet by mouth 2 (two) times daily.    .Marland Kitchen  gabapentin (NEURONTIN) 100 MG capsule Take 1 capsule (100 mg total) by mouth 3 (three) times daily. 90 capsule 3  . hyaluronate sodium (RADIAPLEXRX) GEL Apply 1 application topically 2  (two) times daily.    . Hyprom-Naphaz-Polysorb-Zn Sulf (CLEAR EYES COMPLETE OP) Apply 2 drops to eye 2 (two) times daily as needed (irritation).    Marland Kitchen LANTUS SOLOSTAR 100 UNIT/ML Solostar Pen INJECT 20 UNITS IN THE MORNING  5  . lidocaine-prilocaine (EMLA) cream Apply to affected area once 30 g 3  . loratadine (CLARITIN) 10 MG tablet Take 10 mg by mouth daily as needed for allergies or rhinitis.    Marland Kitchen LORazepam (ATIVAN) 0.5 MG tablet Take 1 tablet (0.5 mg total) by mouth every 6 (six) hours as needed (Nausea or vomiting). (Patient not taking: Reported on 02/04/2017) 30 tablet 0  . losartan (COZAAR) 100 MG tablet Take 100 mg by mouth daily.  2  . metFORMIN (GLUCOPHAGE) 1000 MG tablet Take 1,000 mg by mouth 2 (two) times daily.  5  . Multiple Vitamin (MULTIVITAMIN WITH MINERALS) TABS tablet Take 1 tablet by mouth daily.    . non-metallic deodorant Jethro Poling) MISC Apply 1 application topically daily.    . ondansetron (ZOFRAN) 8 MG tablet Take 1 tablet (8 mg total) by mouth 2 (two) times daily as needed. Start on the third day after chemotherapy. (Patient not taking: Reported on 02/04/2017) 30 tablet 1  . ONETOUCH DELICA LANCETS FINE MISC USE TO TEST 3 TIMES DAILY DX E11.65  11  . ONETOUCH VERIO test strip USE TO TEST 3 TIMES DAILY DX E11.65  11  . prochlorperazine (COMPAZINE) 10 MG tablet Take 1 tablet (10 mg total) by mouth every 6 (six) hours as needed (Nausea or vomiting). (Patient not taking: Reported on 02/04/2017) 30 tablet 1  . simvastatin (ZOCOR) 10 MG tablet Take 10 mg by mouth daily at 6 PM.   2  . thiamine (VITAMIN B-1) 100 MG tablet Take 100 mg by mouth daily.     No current facility-administered medications for this visit.    Facility-Administered Medications Ordered in Other Visits  Medication Dose Route Frequency Provider Last Rate Last Dose  . sodium chloride flush (NS) 0.9 % injection 10 mL  10 mL Intravenous PRN Nicholas Lose, MD   10 mL at 09/25/16 1112    PHYSICAL EXAMINATION: ECOG  PERFORMANCE STATUS: 1 - Symptomatic but completely ambulatory  Vitals:   04/02/17 1033  BP: (!) 155/65  Pulse: (!) 104  Resp: 18  Temp: 98.7 F (37.1 C)  SpO2: 100%   Filed Weights   04/02/17 1033  Weight: 148 lb 14.4 oz (67.5 kg)    GENERAL:alert, no distress and comfortable SKIN: skin color, texture, turgor are normal, no rashes or significant lesions EYES: normal, Conjunctiva are pink and non-injected, sclera clear OROPHARYNX:no exudate, no erythema and lips, buccal mucosa, and tongue normal  NECK: supple, thyroid normal size, non-tender, without nodularity LYMPH:  no palpable lymphadenopathy in the cervical, axillary or inguinal LUNGS: clear to auscultation and percussion with normal breathing effort HEART: regular rate & rhythm and no murmurs and no lower extremity edema ABDOMEN:abdomen soft, non-tender and normal bowel sounds MUSCULOSKELETAL:no cyanosis of digits and no clubbing  NEURO: alert & oriented x 3 with fluent speech, no focal motor/sensory deficits EXTREMITIES: No lower extremity edema  LABORATORY DATA:  I have reviewed the data as listed   Chemistry      Component Value Date/Time   NA 141 04/02/2017 0959  K 3.4 (L) 04/02/2017 0959   CL 101 08/21/2016 1333   CO2 28 04/02/2017 0959   BUN 11.6 04/02/2017 0959   CREATININE 0.6 04/02/2017 0959      Component Value Date/Time   CALCIUM 10.0 04/02/2017 0959   ALKPHOS 55 04/02/2017 0959   AST 12 04/02/2017 0959   ALT 10 04/02/2017 0959   BILITOT 0.45 04/02/2017 0959       Lab Results  Component Value Date   WBC 5.2 04/02/2017   HGB 11.5 (L) 04/02/2017   HCT 35.3 04/02/2017   MCV 84.7 04/02/2017   PLT 214 04/02/2017   NEUTROABS 3.2 04/02/2017    ASSESSMENT & PLAN:  Malignant neoplasm of upper-outer quadrant of right breast in female, estrogen receptor positive ([place]) 07/24/2016: Palpable right breast mass for 2 months UOQ at 10:00: 4.7 x 4.1 x 3.7 cm with enlarged axillary lymph node biopsy  benign, breast mass biopsy grade 3 IDC ER 30% week PR negative HER-2 negative Ki-67 90% CT CAP and Bone scan: No mets  Treatment summary  1. Right mastectomy01/24/2018: IDC grade 3, 7 cm, margins negative, 1/17 nodes positive, ER weakly +30%, PR negative, HER-2 negative, Ki-67 90%, T3 N1 stage IIIa 2. Adjuvant chemotherapy with Adriamycin and Cytoxan dose dense 4 followed by Abraxane weekly 12 (Patient cannot state steroids due to diabetes and so we are not using Taxol) started February 2018 completed June 2018 3. Adjuvant radiation therapy started 03/08/2017 to be completed September 2018 4. Followed by adjuvant antiestrogen therapy started 04/08/2017 -------------------------------------------------------------------------------------------------------------------------- Antiestrogen therapy counseling: I recommended using anastrozole 1 mg daily. I recommended that she start this in October 2018. We discussed the risks and benefits of anti-estrogen therapy with aromatase inhibitors. These include but not limited to insomnia, hot flashes, mood changes, vaginal dryness, bone density loss, and weight gain. We strongly believe that the benefits far outweigh the risks. Patient understands these risks and consented to starting treatment. Planned treatment duration is 7 years.  Return to clinic in January 2019 for toxicity evaluation.  I spent 25 minutes talking to the patient of which more than half was spent in counseling and coordination of care.  No orders of the defined types were placed in this encounter.  The patient has a good understanding of the overall plan. she agrees with it. she will call with any problems that may develop before the next visit here.   Rulon Eisenmenger, MD 04/02/17

## 2017-04-06 ENCOUNTER — Ambulatory Visit
Admission: RE | Admit: 2017-04-06 | Discharge: 2017-04-06 | Disposition: A | Discharge status: Home / Self Care | Payer: Medicare Other | Source: Ambulatory Visit | Attending: Radiation Oncology | Admitting: Radiation Oncology

## 2017-04-06 DIAGNOSIS — Z510 Encounter for antineoplastic radiation therapy: Secondary | ICD-10-CM | POA: Diagnosis not present

## 2017-04-07 ENCOUNTER — Ambulatory Visit
Admission: RE | Admit: 2017-04-07 | Discharge: 2017-04-07 | Disposition: A | Discharge status: Home / Self Care | Payer: Medicare Other | Source: Ambulatory Visit | Attending: Radiation Oncology | Admitting: Radiation Oncology

## 2017-04-07 DIAGNOSIS — Z510 Encounter for antineoplastic radiation therapy: Secondary | ICD-10-CM | POA: Diagnosis not present

## 2017-04-08 ENCOUNTER — Ambulatory Visit: Payer: Medicare Other | Admitting: Radiation Oncology

## 2017-04-08 ENCOUNTER — Ambulatory Visit
Admission: RE | Admit: 2017-04-08 | Discharge: 2017-04-08 | Disposition: A | Discharge status: Home / Self Care | Payer: Medicare Other | Source: Ambulatory Visit | Attending: Radiation Oncology | Admitting: Radiation Oncology

## 2017-04-08 DIAGNOSIS — Z510 Encounter for antineoplastic radiation therapy: Secondary | ICD-10-CM | POA: Diagnosis not present

## 2017-04-09 ENCOUNTER — Ambulatory Visit
Admission: RE | Admit: 2017-04-09 | Discharge: 2017-04-09 | Disposition: A | Discharge status: Home / Self Care | Payer: Medicare Other | Source: Ambulatory Visit | Attending: Radiation Oncology | Admitting: Radiation Oncology

## 2017-04-09 DIAGNOSIS — C50411 Malignant neoplasm of upper-outer quadrant of right female breast: Secondary | ICD-10-CM

## 2017-04-09 DIAGNOSIS — Z170 Estrogen receptor positive status [ER+]: Principal | ICD-10-CM

## 2017-04-09 DIAGNOSIS — Z510 Encounter for antineoplastic radiation therapy: Secondary | ICD-10-CM | POA: Diagnosis not present

## 2017-04-09 MED ORDER — RADIAPLEXRX EX GEL
Freq: Once | CUTANEOUS | Status: AC
  Administered 2017-04-09: 17:00:00 via TOPICAL

## 2017-04-12 ENCOUNTER — Ambulatory Visit
Admission: RE | Admit: 2017-04-12 | Discharge: 2017-04-12 | Disposition: A | Discharge status: Home / Self Care | Payer: Medicare Other | Source: Ambulatory Visit | Attending: Radiation Oncology | Admitting: Radiation Oncology

## 2017-04-12 DIAGNOSIS — Z510 Encounter for antineoplastic radiation therapy: Secondary | ICD-10-CM | POA: Diagnosis not present

## 2017-04-13 ENCOUNTER — Ambulatory Visit
Admission: RE | Admit: 2017-04-13 | Discharge: 2017-04-13 | Disposition: A | Discharge status: Home / Self Care | Payer: Medicare Other | Source: Ambulatory Visit | Attending: Radiation Oncology | Admitting: Radiation Oncology

## 2017-04-13 DIAGNOSIS — Z510 Encounter for antineoplastic radiation therapy: Secondary | ICD-10-CM | POA: Diagnosis not present

## 2017-04-14 ENCOUNTER — Ambulatory Visit
Admission: RE | Admit: 2017-04-14 | Discharge: 2017-04-14 | Disposition: A | Discharge status: Home / Self Care | Payer: Medicare Other | Source: Ambulatory Visit | Attending: Radiation Oncology | Admitting: Radiation Oncology

## 2017-04-14 DIAGNOSIS — Z510 Encounter for antineoplastic radiation therapy: Secondary | ICD-10-CM | POA: Diagnosis not present

## 2017-04-15 ENCOUNTER — Ambulatory Visit: Payer: Medicare Other | Admitting: Radiation Oncology

## 2017-04-15 ENCOUNTER — Ambulatory Visit
Admission: RE | Admit: 2017-04-15 | Discharge: 2017-04-15 | Disposition: A | Discharge status: Home / Self Care | Payer: Medicare Other | Source: Ambulatory Visit | Attending: Radiation Oncology | Admitting: Radiation Oncology

## 2017-04-15 DIAGNOSIS — Z510 Encounter for antineoplastic radiation therapy: Secondary | ICD-10-CM | POA: Diagnosis not present

## 2017-04-16 ENCOUNTER — Ambulatory Visit
Admission: RE | Admit: 2017-04-16 | Discharge: 2017-04-16 | Disposition: A | Discharge status: Home / Self Care | Payer: Medicare Other | Source: Ambulatory Visit | Attending: Radiation Oncology | Admitting: Radiation Oncology

## 2017-04-16 DIAGNOSIS — Z510 Encounter for antineoplastic radiation therapy: Secondary | ICD-10-CM | POA: Diagnosis not present

## 2017-04-19 ENCOUNTER — Ambulatory Visit
Admission: RE | Admit: 2017-04-19 | Discharge: 2017-04-19 | Disposition: A | Discharge status: Home / Self Care | Payer: Medicare Other | Source: Ambulatory Visit | Attending: Radiation Oncology | Admitting: Radiation Oncology

## 2017-04-19 DIAGNOSIS — Z510 Encounter for antineoplastic radiation therapy: Secondary | ICD-10-CM | POA: Diagnosis not present

## 2017-04-20 ENCOUNTER — Ambulatory Visit
Admission: RE | Admit: 2017-04-20 | Discharge: 2017-04-20 | Disposition: A | Discharge status: Home / Self Care | Payer: Medicare Other | Source: Ambulatory Visit | Attending: Radiation Oncology | Admitting: Radiation Oncology

## 2017-04-20 DIAGNOSIS — Z510 Encounter for antineoplastic radiation therapy: Secondary | ICD-10-CM | POA: Diagnosis not present

## 2017-04-21 ENCOUNTER — Ambulatory Visit
Admission: RE | Admit: 2017-04-21 | Discharge: 2017-04-21 | Disposition: A | Discharge status: Home / Self Care | Payer: Medicare Other | Source: Ambulatory Visit | Attending: Radiation Oncology | Admitting: Radiation Oncology

## 2017-04-21 DIAGNOSIS — Z510 Encounter for antineoplastic radiation therapy: Secondary | ICD-10-CM | POA: Diagnosis not present

## 2017-04-22 ENCOUNTER — Telehealth: Payer: Self-pay | Admitting: Adult Health

## 2017-04-22 ENCOUNTER — Ambulatory Visit
Admission: RE | Admit: 2017-04-22 | Discharge: 2017-04-22 | Disposition: A | Discharge status: Home / Self Care | Payer: Medicare Other | Source: Ambulatory Visit | Attending: Radiation Oncology | Admitting: Radiation Oncology

## 2017-04-22 ENCOUNTER — Encounter: Payer: Self-pay | Admitting: Radiation Oncology

## 2017-04-22 DIAGNOSIS — Z510 Encounter for antineoplastic radiation therapy: Secondary | ICD-10-CM | POA: Diagnosis not present

## 2017-04-22 NOTE — Telephone Encounter (Signed)
Scheduled appt per 9/20 sch message - appt time and date the same just changed from [place] to [place] - left message for patient regarding the change.

## 2017-04-28 NOTE — Progress Notes (Signed)
  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 Malignant neoplasm of upper-outer quadrant of right breast in female, estrogen receptor positive ([place]) C50.411    Z17.0     Indication for treatment:  Curative     Radiation treatment dates:   03/08/2017 to 04/22/2017  Site/dose:    1. The Right breast was treated to 50.4 Gy in 28 fractions at 1.8 Gy per fraction. 2. The Right breast was boosted to 10 Gy in 5 fractions at 2 Gy per fraction.  Beams/energy:    1. 3D // 10X, 15X, 6X  2. Electron boost // 6E  Narrative: The patient tolerated radiation treatment relatively well.   She developed dry desquamation in the right axilla and diffuse hyperpigmentation.   Plan: The patient has completed radiation treatment. The patient will return to radiation oncology clinic for routine followup in one month. I advised them to call or return sooner if they have any questions or concerns related to their recovery or treatment.  ------------------------------------------------  Jodelle Gross, MD, PhD  This document serves as a record of services personally performed by Kyung Rudd, MD. It was created on his behalf by Arlyce Harman, a trained medical scribe. The creation of this record is based on the scribe's personal observations and the provider's statements to them. This document has been checked and approved by the attending provider.

## 2017-04-30 ENCOUNTER — Telehealth: Payer: Self-pay

## 2017-04-30 NOTE — Telephone Encounter (Signed)
Someone left a message.  Also she finished xrt on 9/20. When is she to start her anastrozole? She has the prescription filled already.

## 2017-06-07 ENCOUNTER — Ambulatory Visit
Admission: RE | Admit: 2017-06-07 | Discharge: 2017-06-07 | Disposition: A | Discharge status: Home / Self Care | Payer: Medicare Other | Source: Ambulatory Visit | Attending: Radiation Oncology | Admitting: Radiation Oncology

## 2017-06-07 ENCOUNTER — Encounter: Payer: Self-pay | Admitting: Radiation Oncology

## 2017-06-07 VITALS — BP 130/80 | HR 101 | Temp 98.2°F | Resp 18 | Ht 63.0 in | Wt 141.2 lb

## 2017-06-07 DIAGNOSIS — L298 Other pruritus: Secondary | ICD-10-CM | POA: Insufficient documentation

## 2017-06-07 DIAGNOSIS — Z79899 Other long term (current) drug therapy: Secondary | ICD-10-CM | POA: Diagnosis not present

## 2017-06-07 DIAGNOSIS — R234 Changes in skin texture: Secondary | ICD-10-CM | POA: Insufficient documentation

## 2017-06-07 DIAGNOSIS — L598 Other specified disorders of the skin and subcutaneous tissue related to radiation: Secondary | ICD-10-CM | POA: Insufficient documentation

## 2017-06-07 DIAGNOSIS — Z923 Personal history of irradiation: Secondary | ICD-10-CM | POA: Insufficient documentation

## 2017-06-07 DIAGNOSIS — C50911 Malignant neoplasm of unspecified site of right female breast: Secondary | ICD-10-CM | POA: Insufficient documentation

## 2017-06-07 DIAGNOSIS — Z885 Allergy status to narcotic agent status: Secondary | ICD-10-CM | POA: Insufficient documentation

## 2017-06-07 DIAGNOSIS — C50411 Malignant neoplasm of upper-outer quadrant of right female breast: Secondary | ICD-10-CM

## 2017-06-07 DIAGNOSIS — Z7982 Long term (current) use of aspirin: Secondary | ICD-10-CM | POA: Diagnosis not present

## 2017-06-07 DIAGNOSIS — Z170 Estrogen receptor positive status [ER+]: Secondary | ICD-10-CM

## 2017-06-07 NOTE — Progress Notes (Signed)
Radiation Oncology         [phone] ________________________________  Name: Casey Olson MRN: [id number]  Date of Service: 06/07/2017  DOB: 11-Sep-1950  Post Treatment Note  CC: Nolene Ebbs, MD  Nicholas Lose, MD  Diagnosis:   Stage IIA,pT3N1a, high grade, ER weakly positive, PR negative, Her2-neu negativeinvasive ductal carcinoma of the right breast.  Interval Since Last Radiation:  7 weeks   03/08/2017 to 04/22/2017: 1. The Right breast was treated to 50.4 Gy in 28 fractions at 1.8 Gy per fraction. 2. The Right breast was boosted to 10 Gy in 5 fractions at 2 Gy per fraction.   Narrative:  The patient returns today for routine follow-up. During treatment she did very well with radiotherapy but did develop mild desquamation.                             On review of systems, the patient states she's feeling well. She reports occasional itching of her right chest wall. She also describes symptoms of tightness in her neck and shoulder, and reports that she has not been working on her exercises that she learned at physical therapy as much as she probably could.  She would like to try increasing these exercises but would be open to meeting with PT again.  No other complaints are verbalized.  ALLERGIES:  is allergic to percocet [oxycodone-acetaminophen].  Meds: Current Outpatient Medications  Medication Sig Dispense Refill  . acetaminophen (TYLENOL) 325 MG tablet Take 650 mg by mouth every 6 (six) hours as needed for mild pain.    Marland Kitchen anastrozole (ARIMIDEX) 1 MG tablet Take 1 tablet (1 mg total) by mouth daily. 90 tablet 3  . aspirin EC 81 MG tablet Take 81 mg by mouth daily.    . BD PEN NEEDLE NANO U/F 32G X 4 MM MISC USE TO INJECT INSULIN EVERY DAY (E11.65)  5  . Blood Glucose Monitoring Suppl (ONETOUCH VERIO FLEX SYSTEM) w/Device KIT USE TO TEST 3 TIMES DAILY DX E11.65  11  . Calcium Carbonate-Vitamin D (CALTRATE 600+D PO) Take 1 tablet by mouth 2 (two) times daily.    Marland Kitchen  gabapentin (NEURONTIN) 100 MG capsule Take 1 capsule (100 mg total) by mouth 3 (three) times daily. 90 capsule 3  . Hyprom-Naphaz-Polysorb-Zn Sulf (CLEAR EYES COMPLETE OP) Apply 2 drops to eye 2 (two) times daily as needed (irritation).    Marland Kitchen LANTUS SOLOSTAR 100 UNIT/ML Solostar Pen INJECT 10 UNITS IN THE MORNING  5  . lidocaine-prilocaine (EMLA) cream Apply to affected area once 30 g 3  . mirtazapine (REMERON) 15 MG tablet Take 15 mg at bedtime by mouth.    . hyaluronate sodium (RADIAPLEXRX) GEL Apply 1 application topically 2 (two) times daily.    Marland Kitchen loratadine (CLARITIN) 10 MG tablet Take 10 mg by mouth daily as needed for allergies or rhinitis.    Marland Kitchen LORazepam (ATIVAN) 0.5 MG tablet Take 1 tablet (0.5 mg total) by mouth every 6 (six) hours as needed (Nausea or vomiting). (Patient not taking: Reported on 02/04/2017) 30 tablet 0  . losartan (COZAAR) 100 MG tablet Take 100 mg by mouth daily.  2  . metFORMIN (GLUCOPHAGE) 1000 MG tablet Take 1,000 mg by mouth 2 (two) times daily.  5  . Multiple Vitamin (MULTIVITAMIN WITH MINERALS) TABS tablet Take 1 tablet by mouth daily.    . non-metallic deodorant Jethro Poling) MISC Apply 1 application topically daily.    . ondansetron (  ZOFRAN) 8 MG tablet Take 1 tablet (8 mg total) by mouth 2 (two) times daily as needed. Start on the third day after chemotherapy. (Patient not taking: Reported on 02/04/2017) 30 tablet 1  . ONETOUCH DELICA LANCETS FINE MISC USE TO TEST 3 TIMES DAILY DX E11.65  11  . ONETOUCH VERIO test strip USE TO TEST 3 TIMES DAILY DX E11.65  11  . prochlorperazine (COMPAZINE) 10 MG tablet Take 1 tablet (10 mg total) by mouth every 6 (six) hours as needed (Nausea or vomiting). (Patient not taking: Reported on 02/04/2017) 30 tablet 1  . simvastatin (ZOCOR) 10 MG tablet Take 10 mg by mouth daily at 6 PM.   2   No current facility-administered medications for this encounter.    Facility-Administered Medications Ordered in Other Encounters  Medication Dose Route  Frequency Provider Last Rate Last Dose  . sodium chloride flush (NS) 0.9 % injection 10 mL  10 mL Intravenous PRN Nicholas Lose, MD   10 mL at 09/25/16 1112    Physical Findings:  height is '5\' 3"'  (1.6 m) and weight is 141 lb 3.2 oz (64 kg). Her oral temperature is 98.2 F (36.8 C). Her blood pressure is 130/80 and her pulse is 101 (abnormal). Her respiration is 18.  Pain Assessment Pain Score: 0-No pain/10 In general this is a well appearing African American female in no acute distress. She's alert and oriented x4 and appropriate throughout the examination. Cardiopulmonary assessment is negative for acute distress and she exhibits normal effort. The right chest wall and mastectomy site was examined and reveals mild hyperpigmentation without desquamation.   Lab Findings: Lab Results  Component Value Date   WBC 5.2 04/02/2017   HGB 11.5 (L) 04/02/2017   HCT 35.3 04/02/2017   MCV 84.7 04/02/2017   PLT 214 04/02/2017     Radiographic Findings: No results found.  Impression/Plan: 1. 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 The patient has been doing well since completion of radiotherapy. We discussed that we would be happy to continue to follow her as needed, but she will also continue to follow up with Dr. Lindi Adie in medical oncology. She was counseled on skin care as well as measures to avoid sun exposure to this area.  2. Survivorship. She was encouraged to keep her appointment in survivorship clinic. She was given information regarding resources offered here at the cancer center as well.  3. Right upper extremity range of motion.  Patient does have some discomfort with rotation past 90 degrees, she will start back to doing her exercises she learned with physical therapy more regularly and let us know if she needs a referral to get back to their office.     Carola Rhine, PAC

## 2017-06-09 ENCOUNTER — Other Ambulatory Visit: Payer: Self-pay | Admitting: Hematology and Oncology

## 2017-06-09 IMAGING — CT CT ABD-PELV W/ CM
2 of 5 series · 13 of 36 positions shown, 16 images · IV contrast (APPLIED)
Comparison: None.

CLINICAL DATA: Staging breast cancer. Initial diagnosis July 2016. Status post right mastectomy August 2016

EXAM:
CT CHEST, ABDOMEN, AND PELVIS WITH CONTRAST
TECHNIQUE: Multidetector CT imaging of the chest, abdomen and pelvis was
performed following the standard protocol during bolus
administration of intravenous contrast.
CONTRAST:  80mL [id number] IOPAMIDOL ([id number]) INJECTION 61%

[Series 2: cap with · axial · 0.92mm/px · z∈[-683,-198]mm · 10 of 117 slices shown, 13 images]
[im 10/117  mediastinal]
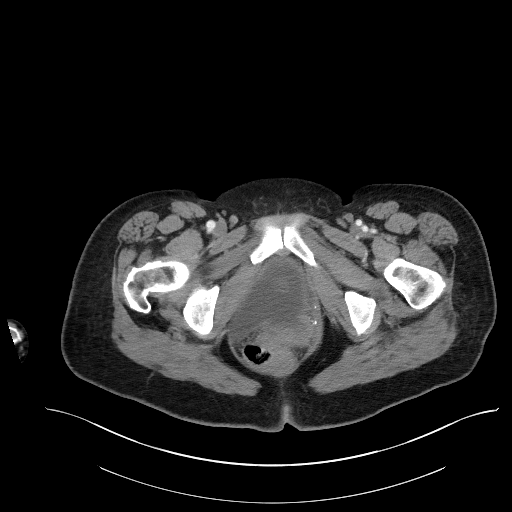
[im 10/117  lung]
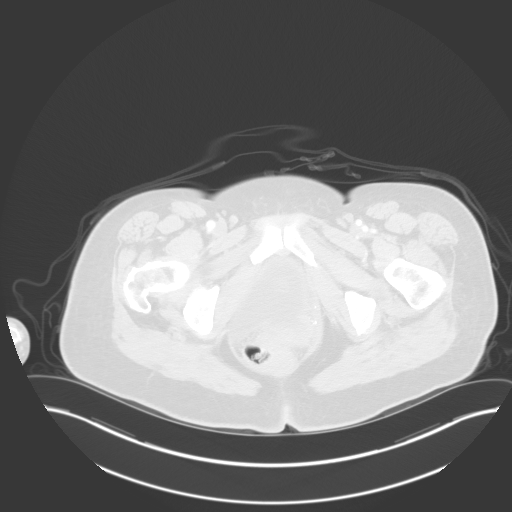
[im 20/117  lung]
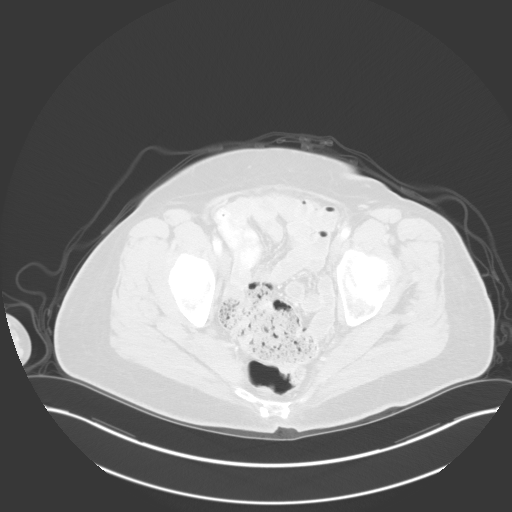
[im 30/117  lung]
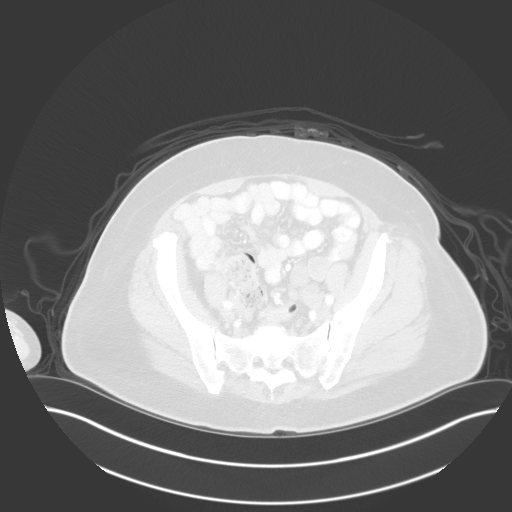
[im 39/117  lung]
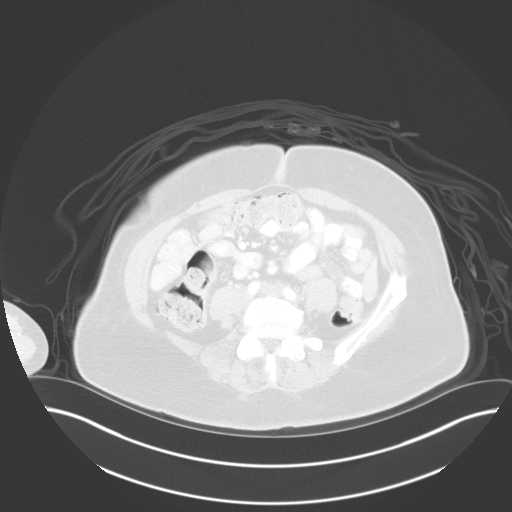
[im 49/117  mediastinal]
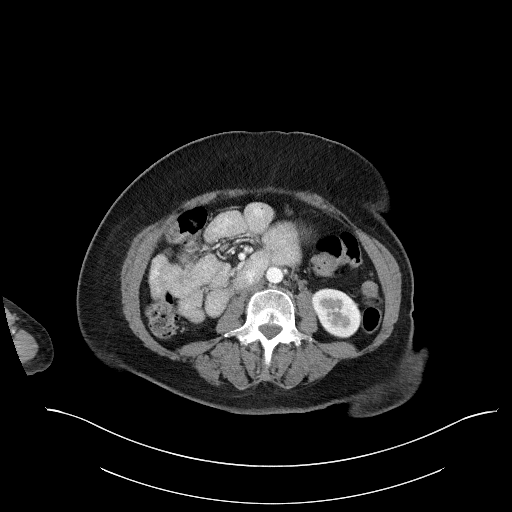
[im 49/117  lung]
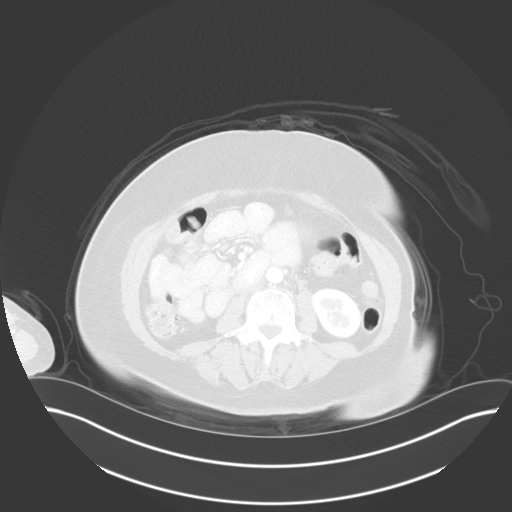
[im 68/117  lung]
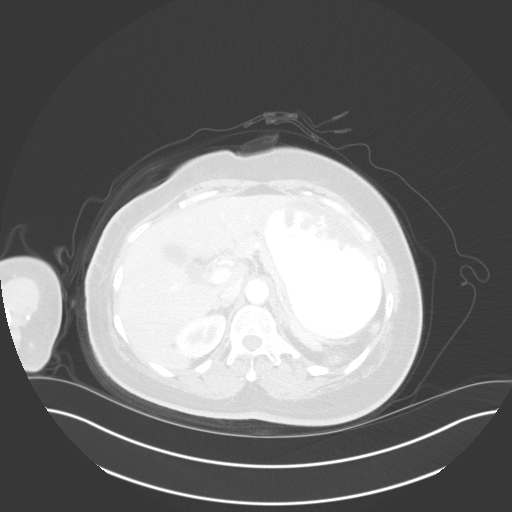
[im 78/117  lung]
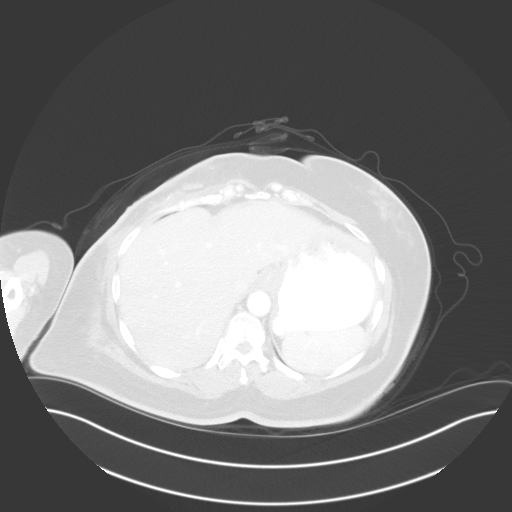
[im 88/117  lung]
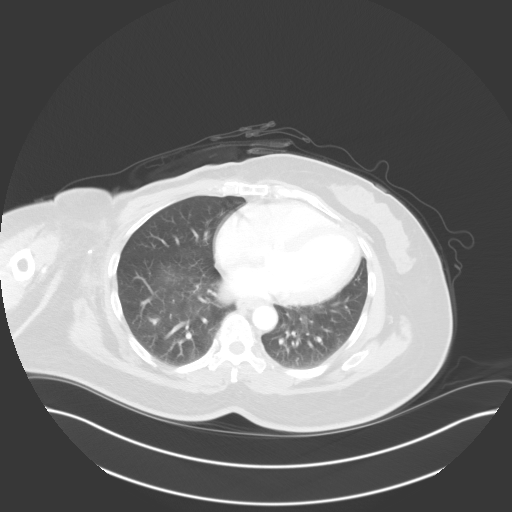
[im 97/117  mediastinal]
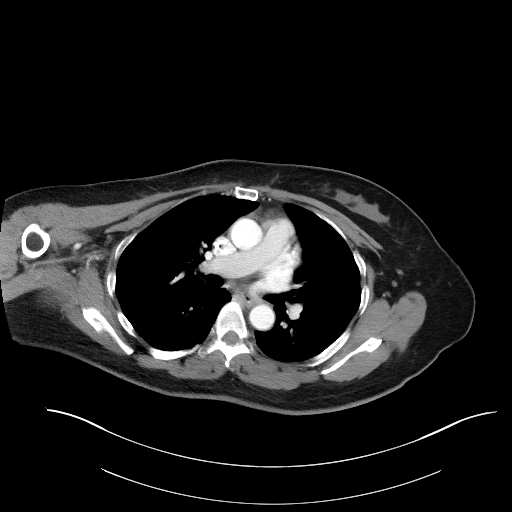
[im 97/117  lung]
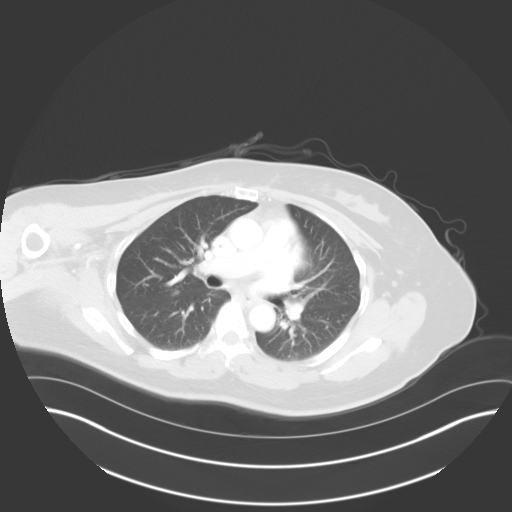
[im 107/117  lung]
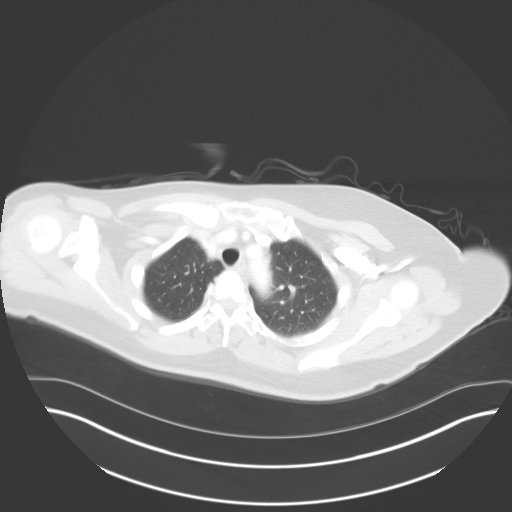

[Series 7: coronals · coronal · 0.82mm/px · 3 of 130 slices shown]
[im 26/130  lung]
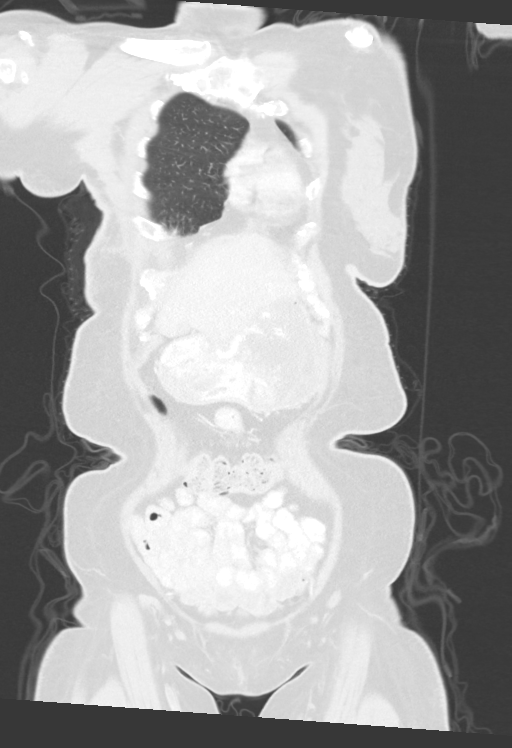
[im 52/130  lung]
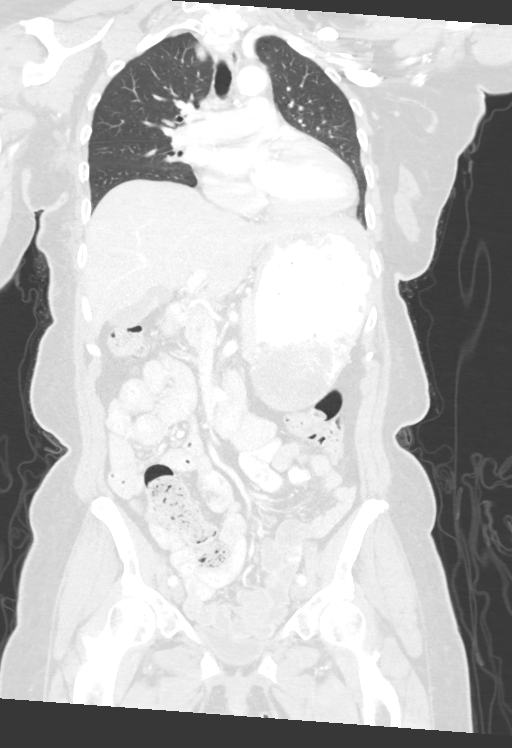
[im 78/130  lung]
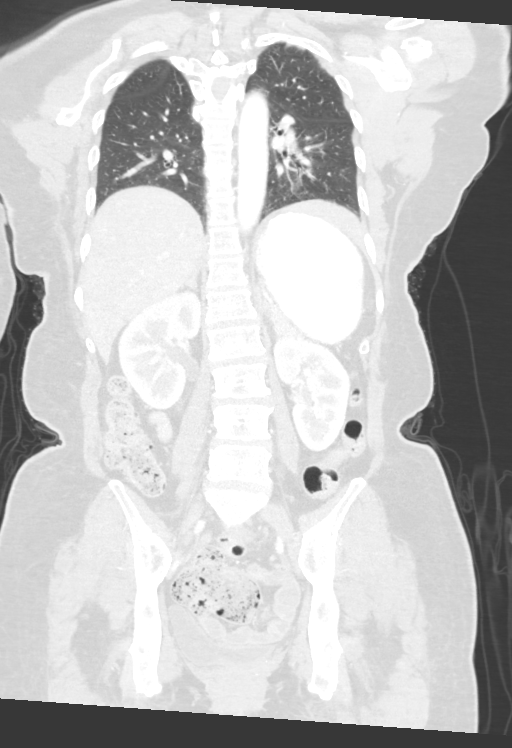

[13 of 36 positions shown; findings below may reference images not displayed]

FINDINGS: CT CHEST FINDINGS

Chest wall: Status post right mastectomy. There is a small amount of
residual air in the subcutaneous fat. Is also some edema and fluid
in the right axilla along with surgical clips. No obvious residual
tumor. The left breast is unremarkable. No supraclavicular or left
axillary adenopathy. The thyroid gland appears normal.

Cardiovascular: The heart is normal in size. No pericardial
effusion. The aorta is normal in caliber. No dissection. The branch
vessels are patent. Atherosclerotic calcifications at the aortic
arch. No obvious coronary artery calcifications. Left-sided
Port-A-Cath tip is in good position in the distal SVC.

Mediastinum/Nodes: No mediastinal or hilar mass or adenopathy. The
esophagus is grossly normal.

Lungs/Pleura: No acute pulmonary findings. No pleural effusion. No
worrisome pulmonary lesions to suggest metastatic disease. A few
small nodules are noted near the fissures and most consistent with
benign lymph nodes.

Musculoskeletal: No significant bony findings.

CT ABDOMEN PELVIS FINDINGS

Hepatobiliary: Small low-attenuation lesion in the right hepatic
lobe on image number 37. This measures 23 Hounsfield units and is
most likely a benign cyst. No other lesions to suggest metastatic
disease. No intrahepatic biliary dilatation. The gallbladder is
normal. No common bile duct dilatation.

Pancreas: No mass, inflammation or ductal dilatation.

Spleen: Normal size.  No focal lesions.

Adrenals/Urinary Tract: The adrenal glands and kidneys are normal.
The bladder is unremarkable.

Stomach/Bowel: The stomach, duodenum, small bowel and colon are
grossly normal without oral contrast. No inflammatory changes, mass
lesions or obstructive findings. The terminal ileum and appendix are
normal.

Vascular/Lymphatic: The aorta and branch vessels are normal. The
major venous structures are normal small scattered mesenteric and
retroperitoneal lymph nodes but no mass or adenopathy.

Reproductive: Surgically absent.

Other: No pelvic mass or adenopathy. No free pelvic fluid
collections. No inguinal mass or adenopathy. No abdominal wall
hernia or subcutaneous lesions.

Musculoskeletal: No significant bony findings.  No bone lesions.
IMPRESSION: Surgical changes related to a right mastectomy. No findings for
residual tumor, regional lymphadenopathy or metastatic disease.

6.5 mm right hepatic lobe liver lesion is most likely a benign cyst.
Attention on future scans is suggested.

## 2017-08-20 IMAGING — CR DG ABDOMEN 2V
3 series · 3 of 3 positions shown · non-contrast
Comparison: None.

CLINICAL DATA: Abdominal discomfort for several weeks.

EXAM:
ABDOMEN - 2 VIEW

[w abdomen upright *]
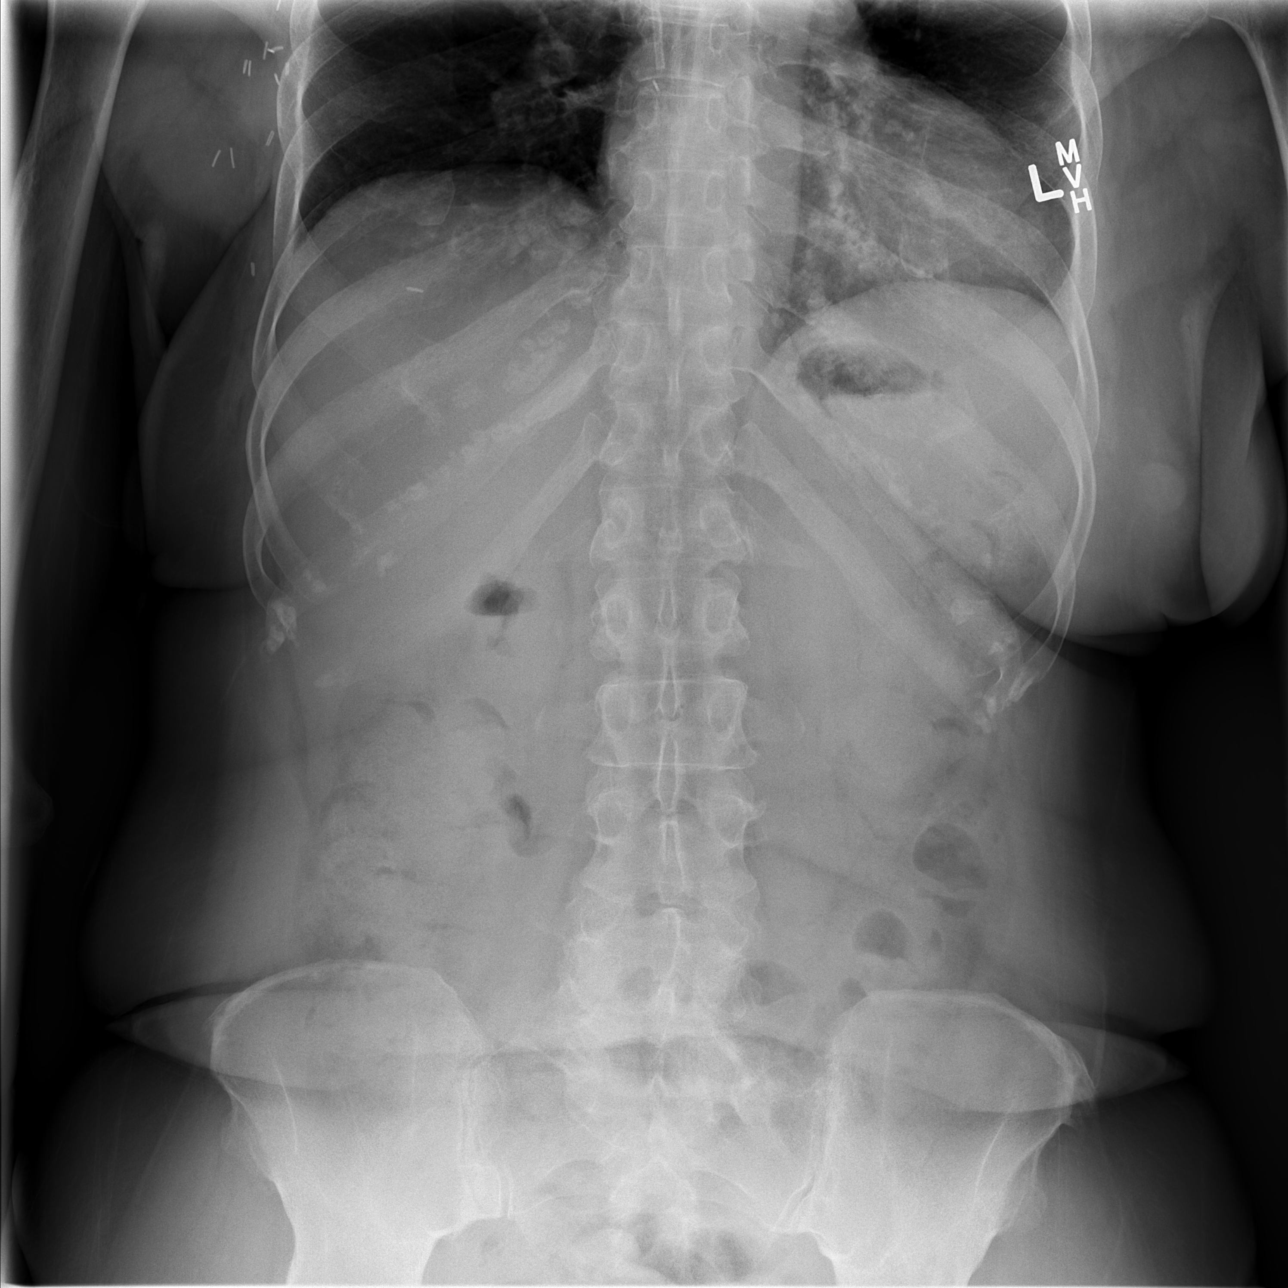

[t abdomen supine (1 of 2)]
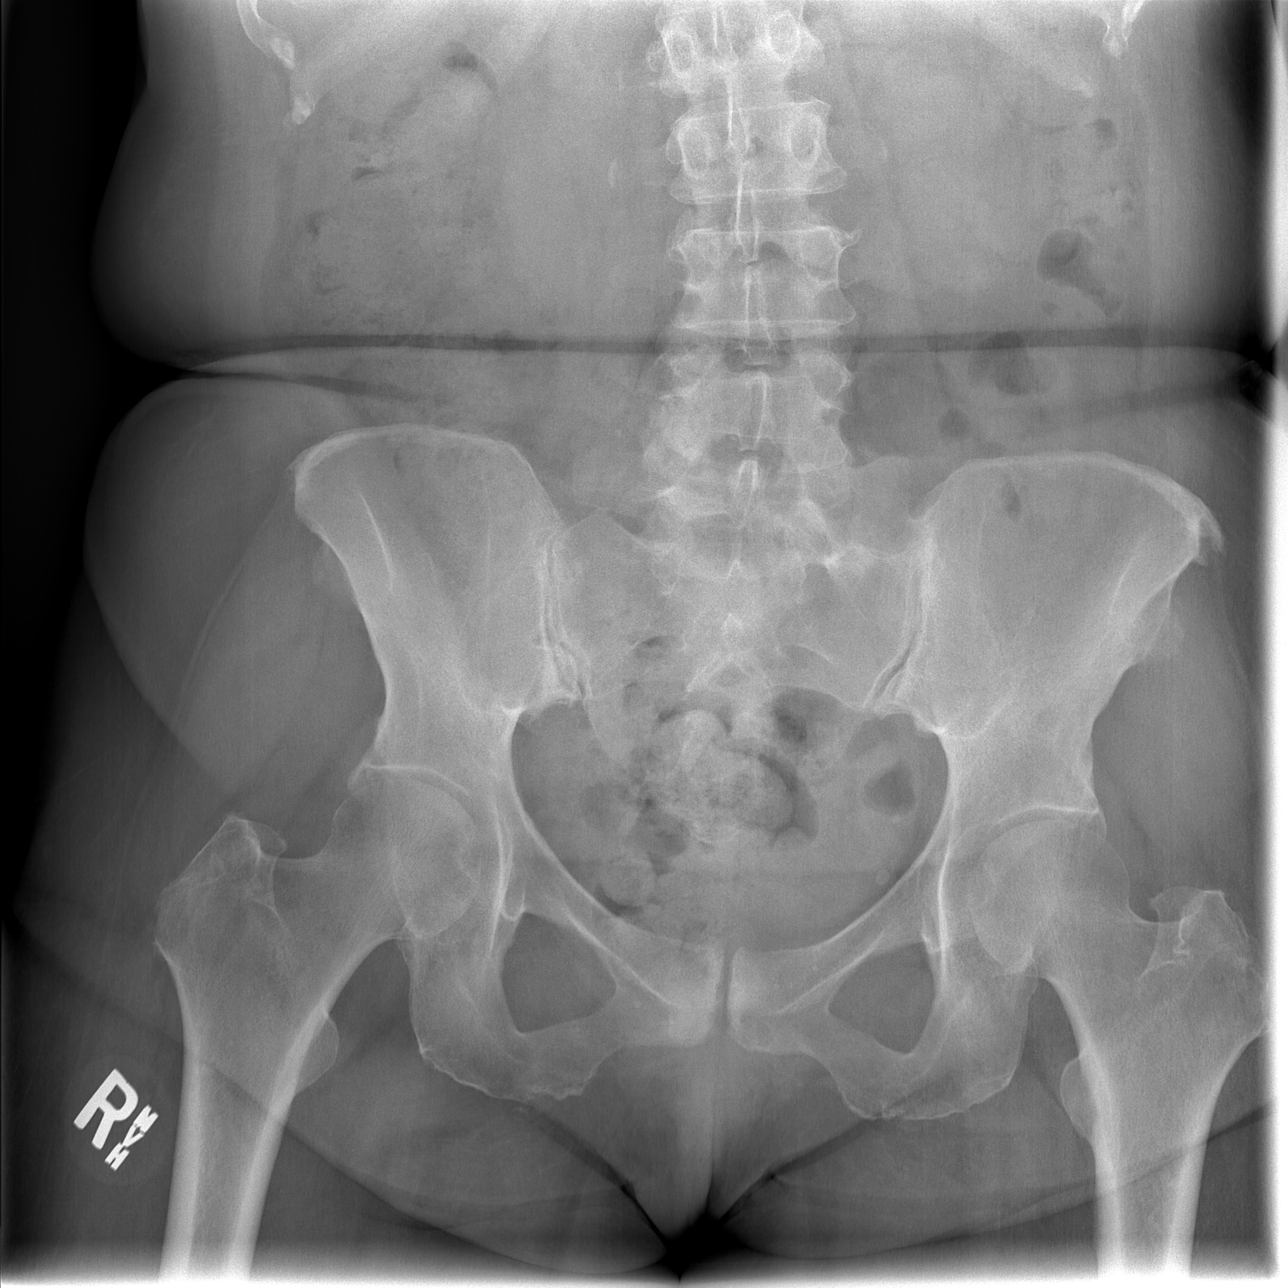

[t abdomen supine (2 of 2)]
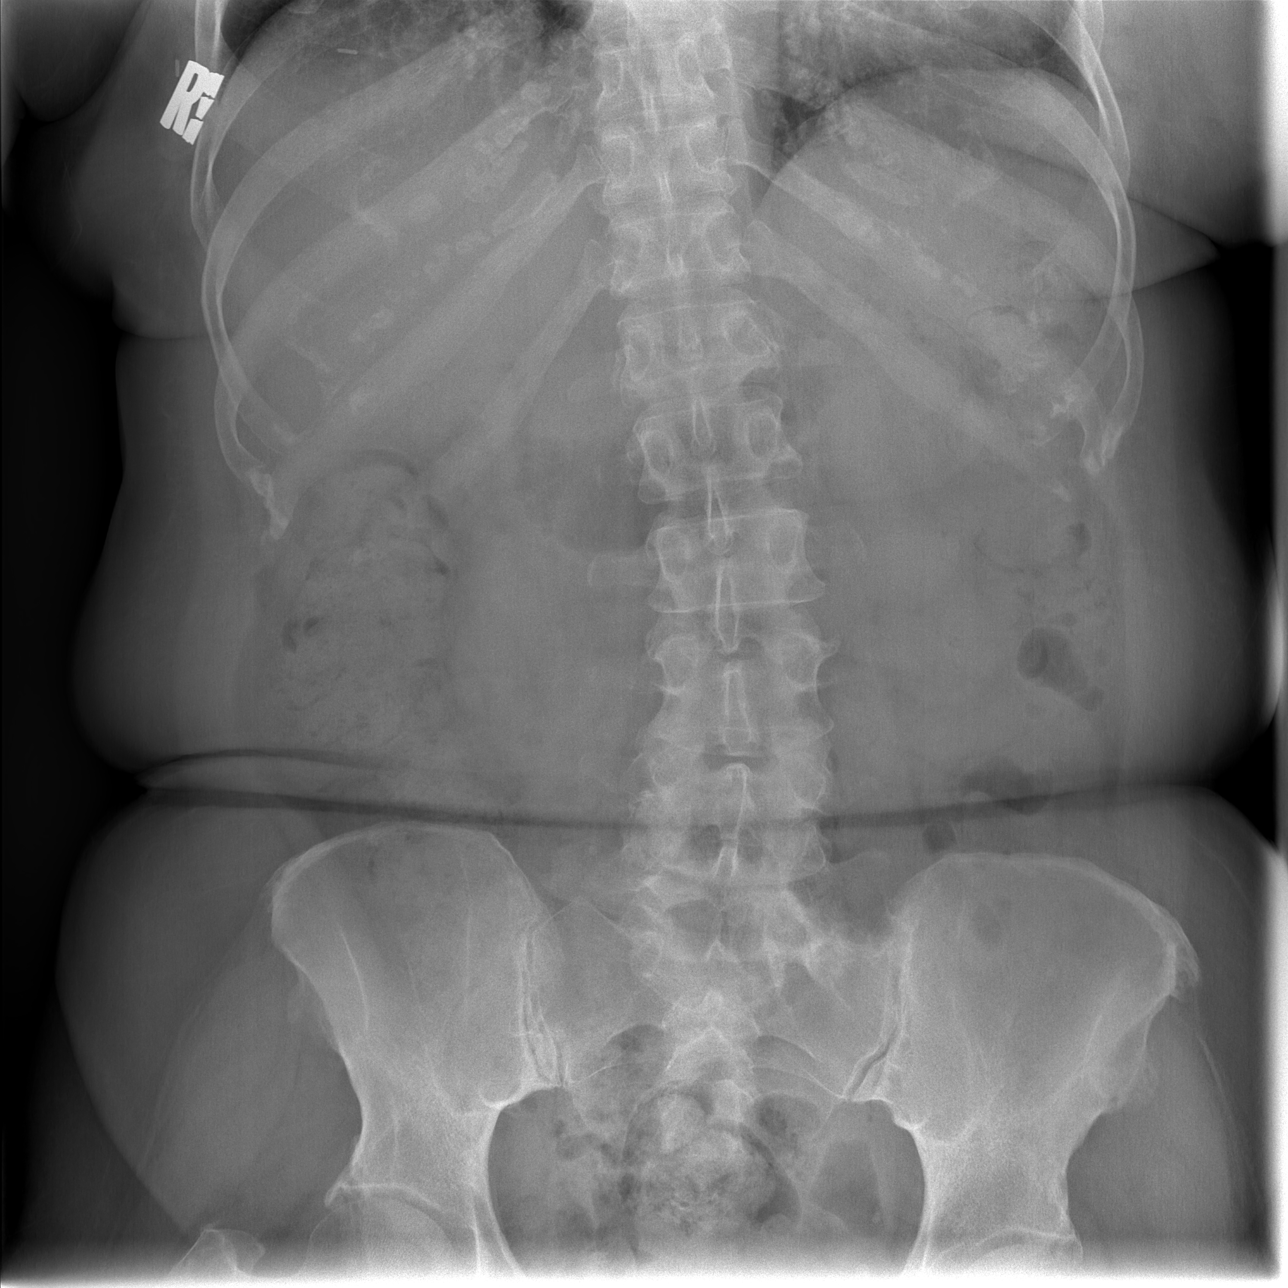

[3 of 3 positions shown; findings below may reference images not displayed]

FINDINGS: No free intraperitoneal air is seen. The bowel gas pattern is
normal. Moderate to moderately large stool burden is most notable in
the ascending colon. No abnormal abdominal calcification or focal
bony abnormality.
IMPRESSION: No acute finding.

Moderate to moderately large stool burden appears worst in the
ascending colon.

## 2017-08-26 ENCOUNTER — Other Ambulatory Visit: Payer: Self-pay | Admitting: Internal Medicine

## 2017-08-26 DIAGNOSIS — E2839 Other primary ovarian failure: Secondary | ICD-10-CM

## 2017-09-01 ENCOUNTER — Inpatient Hospital Stay: Payer: Medicare Other | Attending: Hematology and Oncology | Admitting: Adult Health

## 2017-09-01 ENCOUNTER — Telehealth: Payer: Self-pay | Admitting: Adult Health

## 2017-09-01 ENCOUNTER — Encounter: Payer: Self-pay | Admitting: Adult Health

## 2017-09-01 VITALS — BP 143/68 | HR 80 | Temp 98.6°F | Resp 18 | Ht 63.0 in | Wt 150.2 lb

## 2017-09-01 DIAGNOSIS — E119 Type 2 diabetes mellitus without complications: Secondary | ICD-10-CM | POA: Insufficient documentation

## 2017-09-01 DIAGNOSIS — Z800 Family history of malignant neoplasm of digestive organs: Secondary | ICD-10-CM | POA: Diagnosis not present

## 2017-09-01 DIAGNOSIS — Z79899 Other long term (current) drug therapy: Secondary | ICD-10-CM | POA: Diagnosis not present

## 2017-09-01 DIAGNOSIS — E7800 Pure hypercholesterolemia, unspecified: Secondary | ICD-10-CM

## 2017-09-01 DIAGNOSIS — Z170 Estrogen receptor positive status [ER+]: Secondary | ICD-10-CM | POA: Diagnosis not present

## 2017-09-01 DIAGNOSIS — R232 Flushing: Secondary | ICD-10-CM | POA: Diagnosis not present

## 2017-09-01 DIAGNOSIS — Z7982 Long term (current) use of aspirin: Secondary | ICD-10-CM | POA: Diagnosis not present

## 2017-09-01 DIAGNOSIS — Z8042 Family history of malignant neoplasm of prostate: Secondary | ICD-10-CM | POA: Diagnosis not present

## 2017-09-01 DIAGNOSIS — Z803 Family history of malignant neoplasm of breast: Secondary | ICD-10-CM | POA: Diagnosis not present

## 2017-09-01 DIAGNOSIS — K219 Gastro-esophageal reflux disease without esophagitis: Secondary | ICD-10-CM | POA: Diagnosis not present

## 2017-09-01 DIAGNOSIS — Z9011 Acquired absence of right breast and nipple: Secondary | ICD-10-CM | POA: Insufficient documentation

## 2017-09-01 DIAGNOSIS — Z923 Personal history of irradiation: Secondary | ICD-10-CM | POA: Insufficient documentation

## 2017-09-01 DIAGNOSIS — Z90710 Acquired absence of both cervix and uterus: Secondary | ICD-10-CM | POA: Diagnosis not present

## 2017-09-01 DIAGNOSIS — C50411 Malignant neoplasm of upper-outer quadrant of right female breast: Secondary | ICD-10-CM | POA: Insufficient documentation

## 2017-09-01 DIAGNOSIS — Z1239 Encounter for other screening for malignant neoplasm of breast: Secondary | ICD-10-CM

## 2017-09-01 DIAGNOSIS — I10 Essential (primary) hypertension: Secondary | ICD-10-CM | POA: Insufficient documentation

## 2017-09-01 DIAGNOSIS — Z79818 Long term (current) use of other agents affecting estrogen receptors and estrogen levels: Secondary | ICD-10-CM | POA: Diagnosis not present

## 2017-09-01 DIAGNOSIS — M797 Fibromyalgia: Secondary | ICD-10-CM | POA: Diagnosis not present

## 2017-09-01 NOTE — Progress Notes (Signed)
CLINIC:  Survivorship   REASON FOR VISIT:  Routine follow-up post-treatment for a recent history of breast cancer.  BRIEF ONCOLOGIC HISTORY:    Malignant neoplasm of upper-outer quadrant of right breast in female, estrogen receptor positive ([place])   07/24/2016 Initial Biopsy    Right breast core biopsy, 10 o'clock: IDC, grade 3, ER+ (30%), PR-, Her-2 negative (ratio 1.36).  1 lymph node biopsied was negative for disease.      08/26/2016 Surgery    Right breast mastectomy Marlou Starks): IDC, grade 3, 7cm, margins negative, 1/17 LN + macrometastases.  T3,N1a      09/25/2016 - 01/06/2017 Chemotherapy    Adriamycin and Cytoxan dose dense 4 followed by Abraxane weekly 12      12/03/2016 Genetic Testing    APC c.2213A>G and POLE c.1064A>G VUS found on the Common Hereditary cancer panel.  The Hereditary Gene Panel offered by [facility] includes sequencing and/or deletion duplication testing of the following 46 genes: APC, ATM, AXIN2, BARD1, BMPR1A, BRCA1, BRCA2, BRIP1, CDH1, CDKN2A (p14ARF), CDKN2A (p16INK4a), CHEK2, CTNNA1, DICER1, EPCAM (Deletion/duplication testing only), GREM1 (promoter region deletion/duplication testing only), KIT, MEN1, MLH1, MSH2, MSH3, MSH6, MUTYH, NBN, NF1, NHTL54mPALB2, PDGFRA, PMS2, POLD1, POLE, PTEN, RAD50, RAD51C, RAD51D, SDHB, SDHC, SDHD, SMAD4, SMARCA4. STK11, TP53, TSC1, TSC2, and VHL.  The following gene was evaluated for sequence changes only: SDHA and HOXB13 c.251G>A variant only.  The report date is Dec 03, 2016.       03/08/2017 - 04/22/2017 Radiation Therapy    Adjuvant radiation therapy 1. The Right breast was treated to 50.4 Gy in 28 fractions at 1.8 Gy per fraction. The Right breast was boosted to 10 Gy in 5 fractions at 2 Gy per fraction.        04/2017 -  Anti-estrogen oral therapy    Anastrozole daily       INTERVAL HISTORY:  [facility] to review her survivorship care plan detailing her  treatment course for breast cancer, as well as monitoring long-term side effects of that treatment, education regarding health maintenance, screening, and overall wellness and health promotion.     Overall, Casey Olson feeling quite well today.  She is taking the Anastrozole daily.  She denies significant arthralgias, has mild arthralgias, vaginal dryness, hot flashes.  She takes the medication at night.  She is able to afford the medication.  It is $8.  She wants to know when she can have her port removed.      REVIEW OF SYSTEMS:  Review of Systems  Constitutional: Negative for appetite change, chills, fatigue, fever and unexpected weight change.  HENT:   Negative for hearing loss, lump/mass and mouth sores.   Eyes: Negative for eye problems and icterus.  Respiratory: Negative for chest tightness, cough and shortness of breath.   Cardiovascular: Negative for chest pain, leg swelling and palpitations.  Gastrointestinal: Negative for abdominal distention, abdominal pain, constipation, diarrhea, nausea and vomiting.  Endocrine: Negative for hot flashes.  Musculoskeletal: Negative for arthralgias.  Skin: Negative for itching and rash.  Neurological: Negative for dizziness, extremity weakness and headaches.  Hematological: Negative for adenopathy. Does not bruise/bleed easily.  Psychiatric/Behavioral: Negative for depression. The Casey Olson is not nervous/anxious.   Breast: Denies any new nodularity, masses, tenderness, nipple changes, or nipple discharge.      ONCOLOGY TREATMENT TEAM:  1. Surgeon:  [facility] 2. Medical Oncologist: Dr. GLindi Adie 3. Radiation Oncologist:  Dr. Lisbeth Renshaw    PAST MEDICAL/SURGICAL HISTORY:  Past Medical History:  Diagnosis Date  . Breast cancer ([place]) 07/24/16 bx   right breast  . Bronchitis   . Cancer [facility])    right breast  . Diabetes mellitus without complication ([place])    type 2  . Family history of breast cancer   . Family  history of pancreatic cancer   . Family history of prostate cancer   . GERD (gastroesophageal reflux disease)   . High cholesterol   . Hypertension    Past Surgical History:  Procedure Laterality Date  . ABDOMINAL HYSTERECTOMY    . COLONOSCOPY    . MASTECTOMY W/ SENTINEL NODE BIOPSY Right 08/26/2016   Procedure: RIGHT MASTECTOMY WITH SENTINEL LYMPH NODE BIOPSY;  Surgeon: Autumn Messing III, MD;  Location: [facility];  Service: General;  Laterality: Right;  . PORTACATH PLACEMENT Left 08/26/2016   Procedure: INSERTION PORT-A-CATH;  Surgeon: Autumn Messing III, MD;  Location: [place];  Service: General;  Laterality: Left;  . TUBAL LIGATION    . WISDOM TOOTH EXTRACTION       ALLERGIES:  Allergies  Allergen Reactions  . Percocet [Oxycodone-Acetaminophen] Nausea And Vomiting     CURRENT MEDICATIONS:  Outpatient Encounter Medications as of 09/01/2017  Medication Sig Note  . acetaminophen (TYLENOL) 325 MG tablet Take 650 mg by mouth every 6 (six) hours as needed for mild pain.   Marland Kitchen anastrozole (ARIMIDEX) 1 MG tablet Take 1 tablet (1 mg total) by mouth daily.   Marland Kitchen aspirin EC 81 MG tablet Take 81 mg by mouth daily.   . BD PEN NEEDLE NANO U/F 32G X 4 MM MISC USE TO INJECT INSULIN EVERY DAY (E11.65)   . Blood Glucose Monitoring Suppl (ONETOUCH VERIO FLEX SYSTEM) w/Device KIT USE TO TEST 3 TIMES DAILY DX E11.65 08/05/2016: Received from: External Pharmacy  . Calcium Carbonate-Vitamin D (CALTRATE 600+D PO) Take 1 tablet by mouth 2 (two) times daily.   Marland Kitchen gabapentin (NEURONTIN) 100 MG capsule TAKE 1 CAPSULE BY MOUTH 3 TIMES A DAY   . hyaluronate sodium (RADIAPLEXRX) GEL Apply 1 application topically 2 (two) times daily.   . Hyprom-Naphaz-Polysorb-Zn Sulf (CLEAR EYES COMPLETE OP) Apply 2 drops to eye 2 (two) times daily as needed (irritation).   Marland Kitchen LANTUS SOLOSTAR 100 UNIT/ML Solostar Pen INJECT 10 UNITS IN THE MORNING 02/04/2017: Back to 20 units q am 02/04/17  . loratadine (CLARITIN) 10 MG tablet Take 10 mg by mouth  daily as needed for allergies or rhinitis.   Marland Kitchen losartan (COZAAR) 100 MG tablet Take 100 mg by mouth daily.   . metFORMIN (GLUCOPHAGE) 1000 MG tablet Take 1,000 mg by mouth 2 (two) times daily.   . mirtazapine (REMERON) 15 MG tablet Take 15 mg at bedtime by mouth.   . Multiple Vitamin (MULTIVITAMIN WITH MINERALS) TABS tablet Take 1 tablet by mouth daily.   . non-metallic deodorant Jethro Poling) MISC Apply 1 application topically daily.   Glory Rosebush DELICA LANCETS FINE MISC USE TO TEST 3 TIMES DAILY DX E11.65   . ONETOUCH VERIO test strip USE TO TEST 3 TIMES DAILY DX E11.65   . simvastatin (ZOCOR) 10 MG tablet Take 10 mg by mouth daily at 6 PM.    . lidocaine-prilocaine (EMLA) cream Apply to affected area once (Casey Olson not taking: Reported on 09/01/2017)   . LORazepam (ATIVAN) 0.5 MG tablet Take 1 tablet (0.5 mg total) by mouth every 6 (six) hours as needed (Nausea or vomiting). (Casey Olson not taking:  Reported on 02/04/2017)   . ondansetron (ZOFRAN) 8 MG tablet Take 1 tablet (8 mg total) by mouth 2 (two) times daily as needed. Start on the third day after chemotherapy. (Casey Olson not taking: Reported on 02/04/2017)   . prochlorperazine (COMPAZINE) 10 MG tablet Take 1 tablet (10 mg total) by mouth every 6 (six) hours as needed (Nausea or vomiting). (Casey Olson not taking: Reported on 02/04/2017)    Facility-Administered Encounter Medications as of 09/01/2017  Medication  . sodium chloride flush (NS) 0.9 % injection 10 mL     ONCOLOGIC FAMILY HISTORY:  Family History  Problem Relation Age of Onset  . Breast cancer Cousin        mat first cousin; dx in her late 22s to early 63s  . Stroke Mother   . Prostate cancer Father   . Pancreatic cancer Sister 76  . Sickle cell trait Brother   . Prostate cancer Maternal Uncle   . Breast cancer Paternal Aunt        dx over 62  . Leukemia Maternal Grandmother   . Heart attack Paternal Grandmother   . Heart attack Paternal Grandfather   . Sickle cell trait Sister   .  Sickle cell anemia Other   . Colon polyps Sister   . Breast cancer Cousin        paternal first cousin died in her 43s  . Breast cancer Cousin        pat first cousin died in her 43s  . Breast cancer Cousin        pat first cousin dx over 68     GENETIC COUNSELING/TESTING: See above  SOCIAL HISTORY:  Lunette Tapp is divorced and lives with her daughter and grandchildren in [place], [place].  Ms. Altmann is currently retired.  She denies any current or history of tobacco, alcohol, or illicit drug use.     PHYSICAL EXAMINATION:  Vital Signs:   Vitals:   09/01/17 0943  BP: (!) 143/68  Pulse: 80  Resp: 18  Temp: 98.6 F (37 C)  SpO2: 100%   Filed Weights   09/01/17 0943  Weight: 150 lb 3.2 oz (68.1 kg)   General: Well-nourished, well-appearing female in no acute distress.  She is accompanied in clinic by her sister Katharine Look today.   HEENT: Head is normocephalic.  Pupils equal and reactive to light. Conjunctivae clear without exudate.  Sclerae anicteric. Oral mucosa is pink, moist.  Oropharynx is pink without lesions or erythema.  Lymph: No cervical, supraclavicular, or infraclavicular lymphadenopathy noted on palpation.  Cardiovascular: Regular rate and rhythm.Marland Kitchen Respiratory: Clear to auscultation bilaterally. Chest expansion symmetric; breathing non-labored.  Breasts: right breast s/p mastectomy, no nodularity, mass, or tenderness, + hyperpigmentation from radiation, left breast without nodules, masses skin or nipple changes GI: Abdomen soft and round; non-tender, non-distended. Bowel sounds normoactive.  GU: Deferred.  Neuro: No focal deficits. Steady gait.  Psych: Mood and affect normal and appropriate for situation.  Extremities: No edema. MSK: No focal spinal tenderness to palpation.  Limited ROM in right shoulder Skin: Warm and dry.  LABORATORY DATA:  None for this visit.  DIAGNOSTIC IMAGING:  None for this visit.      ASSESSMENT AND PLAN:  Casey Olson is a pleasant 67 y.o. 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  She presents to the Survivorship Clinic for our initial  meeting and routine follow-up post-completion of treatment for breast cancer.    1. Stage IIIA right breast cancer:  Casey Olson is continuing to recover from definitive treatment for breast cancer. She will follow-up with her medical oncologist, Dr. Lindi Adie  with history and physical exam per surveillance protocol.  She will continue her anti-estrogen therapy with Anastrozole. Thus far, she is tolerating the Anastrozole well, with minimal side effects.  She is overdue for her left breast screening mammogram.  I will order this for her today.  I reviewed her case with Dr. Lindi Adie.  She had a CT abdomen/pelvis last year that demonstrated a 6.5 mm likely benign cyst on the liver.  I asked if we needed to repeat imaging or order MRI.  He did not want to re image the Casey Olson.  Today, a comprehensive survivorship care plan and treatment summary was reviewed with the Casey Olson today detailing her breast cancer diagnosis, treatment course, potential late/long-term effects of treatment, appropriate follow-up care with recommendations for the future, and Casey Olson education resources.  A copy of this summary, along with a letter will be sent to the Casey Olson's primary care provider via mail/fax/In Basket message after today's visit.    2. Right shoulder ROM limitation: I recommended Physical Therapy.  At this point, Casey Olson is going to be keeping her grandchildren.  Due to this, she is going to be working on exercises at home, should it not start to improve she will call me and then I will place a referral to Physical Therapy at that time.   3. Bone health:  Given Casey Olson age/history of breast  cancer and her current treatment regimen including anti-estrogen therapy with Anastrozole, she is at risk for bone demineralization.  She has a DEXA scan scheduled for 09/22/2017.  In the meantime, she was encouraged to increase her consumption of foods rich in calcium, as well as increase her weight-bearing activities.  She was given education on specific activities to promote bone health.  4. Cancer screening:  Due to Casey Olson history and her age, she should receive screening for skin cancers, colon cancer, and gynecologic cancers.  The information and recommendations are listed on the Casey Olson's comprehensive care plan/treatment summary and were reviewed in detail with the Casey Olson.    5. Health maintenance and wellness promotion: Casey Olson was encouraged to consume 5-7 servings of fruits and vegetables per day. We reviewed the "Nutrition Rainbow" handout, as well as the handout "Take Control of Your Health and Reduce Your Cancer Risk" from the [place].  She was also encouraged to engage in moderate to vigorous exercise for 30 minutes per day most days of the week. We discussed the [employer] fitness program, which is designed for cancer survivors to help them become more physically fit after cancer treatments.  She was instructed to limit her alcohol consumption and continue to abstain from tobacco use.     6. Support services/counseling: It is not uncommon for this period of the Casey Olson's cancer care trajectory to be one of many emotions and stressors.  We discussed an opportunity for her to participate in the next session of [facility] ("Finding Your New Normal") support group series designed for patients after they have completed treatment.   Casey Olson was encouraged to take advantage of our many other support services programs, support groups, and/or counseling in coping with her new life as a cancer survivor after completing anti-cancer treatment.  She was offered support today  through active listening and  expressive supportive counseling.  She was given information regarding our available services and encouraged to contact me with any questions or for help enrolling in any of our support group/programs.    Dispo:   -Return to cancer center for follow up with Dr. Lindi Adie in 6 months -Left breast screening mammogram  09/22/2017 -Bone Density in 09/22/2017 -Port removal by Dr. Marlou Starks ASAP -She is welcome to return back to the Survivorship Clinic at any time; no additional follow-up needed at this time.  -Consider referral back to survivorship as a long-term survivor for continued surveillance  A total of (30) minutes of face-to-face time was spent with this Casey Olson with greater than 50% of that time in counseling and care-coordination.   Gardenia Phlegm, NP Survivorship Program [facility] [phone]   Note: PRIMARY CARE PROVIDER Nolene Ebbs, [place] [phone]

## 2017-09-01 NOTE — Telephone Encounter (Signed)
Gave patient AVS and calendar of upcoming July appointments.  °

## 2017-09-03 ENCOUNTER — Ambulatory Visit: Payer: Self-pay | Admitting: General Surgery

## 2017-09-06 ENCOUNTER — Other Ambulatory Visit: Payer: Self-pay

## 2017-09-06 ENCOUNTER — Encounter (HOSPITAL_BASED_OUTPATIENT_CLINIC_OR_DEPARTMENT_OTHER): Payer: Self-pay | Admitting: *Deleted

## 2017-09-07 ENCOUNTER — Other Ambulatory Visit: Payer: Self-pay

## 2017-09-07 ENCOUNTER — Encounter (HOSPITAL_BASED_OUTPATIENT_CLINIC_OR_DEPARTMENT_OTHER)
Admission: RE | Admit: 2017-09-07 | Discharge: 2017-09-07 | Disposition: A | Discharge status: Home / Self Care | Payer: Medicare Other | Source: Ambulatory Visit | Attending: General Surgery | Admitting: General Surgery

## 2017-09-07 DIAGNOSIS — I10 Essential (primary) hypertension: Secondary | ICD-10-CM | POA: Diagnosis not present

## 2017-09-07 DIAGNOSIS — C50411 Malignant neoplasm of upper-outer quadrant of right female breast: Secondary | ICD-10-CM | POA: Diagnosis not present

## 2017-09-07 DIAGNOSIS — E119 Type 2 diabetes mellitus without complications: Secondary | ICD-10-CM | POA: Diagnosis not present

## 2017-09-07 DIAGNOSIS — Z803 Family history of malignant neoplasm of breast: Secondary | ICD-10-CM | POA: Diagnosis not present

## 2017-09-07 DIAGNOSIS — Z452 Encounter for adjustment and management of vascular access device: Secondary | ICD-10-CM | POA: Diagnosis present

## 2017-09-07 DIAGNOSIS — Z170 Estrogen receptor positive status [ER+]: Secondary | ICD-10-CM | POA: Diagnosis not present

## 2017-09-07 LAB — BASIC METABOLIC PANEL
ANION GAP: 12 (ref 5–15)
BUN: 13 mg/dL (ref 6–20)
CO2: 25 mmol/L (ref 22–32)
CREATININE: 0.5 mg/dL (ref 0.44–1.00)
Calcium: 9.4 mg/dL (ref 8.9–10.3)
Chloride: 105 mmol/L (ref 101–111)
GFR calc non Af Amer: >60 mL/min (ref >60)
Glucose, Bld: 98 mg/dL (ref 65–99)
Potassium: 4.1 mmol/L (ref 3.5–5.1)
SODIUM: 142 mmol/L (ref 135–145)

## 2017-09-07 NOTE — Progress Notes (Signed)
Ensure pre surgery drink given with instructions to complete by [facility], pt verbalized understanding.  EKG reviewed by Dr. Jenita Seashore, will proceed with surgery as scheduled.

## 2017-09-08 ENCOUNTER — Ambulatory Visit (HOSPITAL_BASED_OUTPATIENT_CLINIC_OR_DEPARTMENT_OTHER): Payer: Medicare Other | Admitting: Anesthesiology

## 2017-09-08 ENCOUNTER — Ambulatory Visit (HOSPITAL_BASED_OUTPATIENT_CLINIC_OR_DEPARTMENT_OTHER)
Admission: RE | Admit: 2017-09-08 | Discharge: 2017-09-08 | Disposition: A | Discharge status: Home / Self Care | Payer: Medicare Other | Source: Ambulatory Visit | Attending: General Surgery | Admitting: General Surgery

## 2017-09-08 ENCOUNTER — Encounter (HOSPITAL_BASED_OUTPATIENT_CLINIC_OR_DEPARTMENT_OTHER)
Admission: RE | Disposition: A | Discharge status: Home / Self Care | Payer: Self-pay | Source: Ambulatory Visit | Attending: General Surgery

## 2017-09-08 ENCOUNTER — Encounter (HOSPITAL_BASED_OUTPATIENT_CLINIC_OR_DEPARTMENT_OTHER): Payer: Self-pay | Admitting: Anesthesiology

## 2017-09-08 ENCOUNTER — Other Ambulatory Visit: Payer: Self-pay

## 2017-09-08 DIAGNOSIS — Z170 Estrogen receptor positive status [ER+]: Secondary | ICD-10-CM | POA: Insufficient documentation

## 2017-09-08 DIAGNOSIS — Z452 Encounter for adjustment and management of vascular access device: Secondary | ICD-10-CM | POA: Diagnosis not present

## 2017-09-08 DIAGNOSIS — Z803 Family history of malignant neoplasm of breast: Secondary | ICD-10-CM | POA: Insufficient documentation

## 2017-09-08 DIAGNOSIS — E119 Type 2 diabetes mellitus without complications: Secondary | ICD-10-CM | POA: Insufficient documentation

## 2017-09-08 DIAGNOSIS — I10 Essential (primary) hypertension: Secondary | ICD-10-CM | POA: Insufficient documentation

## 2017-09-08 DIAGNOSIS — C50411 Malignant neoplasm of upper-outer quadrant of right female breast: Secondary | ICD-10-CM | POA: Insufficient documentation

## 2017-09-08 HISTORY — PX: PORT-A-CATH REMOVAL: SHX5289

## 2017-09-08 LAB — GLUCOSE, CAPILLARY
GLUCOSE-CAPILLARY: 122 mg/dL — AB (ref 65–99)
GLUCOSE-CAPILLARY: 71 mg/dL (ref 65–99)

## 2017-09-08 SURGERY — REMOVAL PORT-A-CATH
Anesthesia: Monitor Anesthesia Care | Site: Chest | Laterality: Left

## 2017-09-08 MED ORDER — OXYCODONE HCL 5 MG/5ML PO SOLN: 5.0000 mg | Freq: Once | ORAL | Status: DC | PRN

## 2017-09-08 MED ORDER — CHLORHEXIDINE GLUCONATE CLOTH 2 % EX PADS: 6.0000 | MEDICATED_PAD | Freq: Once | CUTANEOUS | Status: DC

## 2017-09-08 MED ORDER — BUPIVACAINE HCL 0.25 % IJ SOLN
INTRAMUSCULAR | Status: DC | PRN
  Administered 2017-09-08: 9 mL via INTRAMUSCULAR

## 2017-09-08 MED ORDER — MEPERIDINE HCL 25 MG/ML IJ SOLN: 6.2500 mg | INTRAMUSCULAR | Status: DC | PRN

## 2017-09-08 MED ORDER — FENTANYL CITRATE (PF) 100 MCG/2ML IJ SOLN
50.0000 ug | INTRAMUSCULAR | Status: DC | PRN
  Administered 2017-09-08: 100 ug via INTRAVENOUS

## 2017-09-08 MED ORDER — OXYCODONE HCL 5 MG PO TABS: 5.0000 mg | ORAL_TABLET | Freq: Once | ORAL | Status: DC | PRN

## 2017-09-08 MED ORDER — SCOPOLAMINE 1 MG/3DAYS TD PT72: 1.0000 | MEDICATED_PATCH | Freq: Once | TRANSDERMAL | Status: DC | PRN

## 2017-09-08 MED ORDER — ONDANSETRON HCL 4 MG/2ML IJ SOLN
INTRAMUSCULAR | Status: AC
  Filled 2017-09-08: qty 2

## 2017-09-08 MED ORDER — MIDAZOLAM HCL 2 MG/2ML IJ SOLN
INTRAMUSCULAR | Status: AC
  Filled 2017-09-08: qty 2

## 2017-09-08 MED ORDER — LACTATED RINGERS IV SOLN
INTRAVENOUS | Status: DC
  Administered 2017-09-08: 13:00:00 via INTRAVENOUS

## 2017-09-08 MED ORDER — PROMETHAZINE HCL 25 MG/ML IJ SOLN: 6.2500 mg | INTRAMUSCULAR | Status: DC | PRN

## 2017-09-08 MED ORDER — LIDOCAINE-EPINEPHRINE (PF) 1 %-1:200000 IJ SOLN
INTRAMUSCULAR | Status: AC
  Filled 2017-09-08: qty 30

## 2017-09-08 MED ORDER — ONDANSETRON HCL 4 MG/2ML IJ SOLN
INTRAMUSCULAR | Status: DC | PRN
  Administered 2017-09-08: 4 mg via INTRAVENOUS

## 2017-09-08 MED ORDER — BUPIVACAINE HCL (PF) 0.25 % IJ SOLN
INTRAMUSCULAR | Status: AC
  Filled 2017-09-08: qty 30

## 2017-09-08 MED ORDER — PROPOFOL 10 MG/ML IV BOLUS
INTRAVENOUS | Status: DC | PRN
  Administered 2017-09-08: 20 mg via INTRAVENOUS
  Administered 2017-09-08 (×3): 10 mg via INTRAVENOUS

## 2017-09-08 MED ORDER — FENTANYL CITRATE (PF) 100 MCG/2ML IJ SOLN
INTRAMUSCULAR | Status: AC
  Filled 2017-09-08: qty 2

## 2017-09-08 MED ORDER — FENTANYL CITRATE (PF) 100 MCG/2ML IJ SOLN: 25.0000 ug | INTRAMUSCULAR | Status: DC | PRN

## 2017-09-08 MED ORDER — MIDAZOLAM HCL 2 MG/2ML IJ SOLN
1.0000 mg | INTRAMUSCULAR | Status: DC | PRN
  Administered 2017-09-08: 2 mg via INTRAVENOUS

## 2017-09-08 MED ORDER — TRAMADOL HCL 50 MG PO TABS: 50.0000 mg | ORAL_TABLET | Freq: Four times a day (QID) | ORAL | 0 refills | Status: DC | PRN

## 2017-09-08 MED ORDER — PROPOFOL 10 MG/ML IV BOLUS
INTRAVENOUS | Status: AC
  Filled 2017-09-08: qty 20

## 2017-09-08 SURGICAL SUPPLY — 26 items
BLADE SURG 15 STRL LF DISP TIS (BLADE) ×1 IMPLANT
BLADE SURG 15 STRL SS (BLADE) ×2
CHLORAPREP W/TINT 26ML (MISCELLANEOUS) ×3 IMPLANT
COVER BACK TABLE 60X90IN (DRAPES) ×3 IMPLANT
COVER MAYO STAND STRL (DRAPES) ×3 IMPLANT
DECANTER SPIKE VIAL GLASS SM (MISCELLANEOUS) ×3 IMPLANT
DERMABOND ADVANCED (GAUZE/BANDAGES/DRESSINGS) ×2
DERMABOND ADVANCED .7 DNX12 (GAUZE/BANDAGES/DRESSINGS) ×1 IMPLANT
DRAPE LAPAROTOMY 100X72 PEDS (DRAPES) ×3 IMPLANT
DRAPE UTILITY XL STRL (DRAPES) ×3 IMPLANT
ELECT COATED BLADE 2.86 ST (ELECTRODE) IMPLANT
ELECT REM PT RETURN 9FT ADLT (ELECTROSURGICAL)
ELECTRODE REM PT RTRN 9FT ADLT (ELECTROSURGICAL) IMPLANT
GLOVE BIO SURGEON STRL SZ7.5 (GLOVE) ×3 IMPLANT
GOWN STRL REUS W/ TWL LRG LVL3 (GOWN DISPOSABLE) ×2 IMPLANT
GOWN STRL REUS W/TWL LRG LVL3 (GOWN DISPOSABLE) ×4
NEEDLE HYPO 25X1 1.5 SAFETY (NEEDLE) ×3 IMPLANT
PACK BASIN DAY SURGERY FS (CUSTOM PROCEDURE TRAY) ×3 IMPLANT
PENCIL BUTTON HOLSTER BLD 10FT (ELECTRODE) IMPLANT
SLEEVE SCD COMPRESS KNEE MED (MISCELLANEOUS) IMPLANT
SUT MON AB 4-0 PC3 18 (SUTURE) ×3 IMPLANT
SUT VIC AB 3-0 SH 27 (SUTURE) ×2
SUT VIC AB 3-0 SH 27X BRD (SUTURE) ×1 IMPLANT
SYR CONTROL 10ML LL (SYRINGE) ×3 IMPLANT
TOWEL OR 17X24 6PK STRL BLUE (TOWEL DISPOSABLE) ×3 IMPLANT
TOWEL OR NON WOVEN STRL DISP B (DISPOSABLE) ×3 IMPLANT

## 2017-09-08 NOTE — H&P (Signed)
Casey Olson  Location: [facility] Patient #: [id number] DOB: 1950/12/31 Undefined / Language: [name] / Race: Black or African American Female   History of Present Illness The patient is a 67 year old female who presents with breast cancer. We are asked to see the patient in consultation by Dr. Lisbeth Renshaw to evaluate her for a new right breast cancer. The patient is a 67 yo black female who recently had a mammogram after not getting one last year. She felt a mass in the upper outer right breast about 2 months ago. It has been painful. She denies any discharge from the nipple. The mass measured 4.7cm. It was ER + weakly and PR- and her2 - with a Ki67 of 90%. She denies any hormone replacement.   Past Surgical History Breast Biopsy  Right. Colon Polyp Removal - Colonoscopy  Hysterectomy (not due to cancer) - Complete  Oral Surgery   Diagnostic Studies History  Colonoscopy  5-10 years ago Mammogram  within last year Pap Smear  1-5 years ago  Medication History  Medications Reconciled  Social History  Alcohol use  Occasional alcohol use. No caffeine use  No drug use  Tobacco use  Never smoker.  Family History  Arthritis  Sister. Bleeding disorder  Family Members In General, Sister. Breast Cancer  Family Members In General. Cerebrovascular Accident  Daughter. Heart Disease  Sister. Malignant Neoplasm Of Pancreas  Sister.  Pregnancy / Birth History  Age at menarche  69 years. Age of menopause  <45 Contraceptive History  Intrauterine device, Oral contraceptives. Gravida  2 Maternal age  97-25 Para  2  Other Problems  Back Pain  Breast Cancer  Diabetes Mellitus  Gastroesophageal Reflux Disease  High blood pressure  Hypercholesterolemia  Lump In Breast  Oophorectomy     Review of Systems  General Not Present- Appetite Loss, Chills, Fatigue, Fever, Night Sweats, Weight Gain and Weight Loss. Skin Present- Dryness. Not  Present- Change in Wart/Mole, Hives, Jaundice, New Lesions, Non-Healing Wounds, Rash and Ulcer. HEENT Present- Wears glasses/contact lenses. Not Present- Earache, Hearing Loss, Hoarseness, Nose Bleed, Oral Ulcers, Ringing in the Ears, Seasonal Allergies, Sinus Pain, Sore Throat, Visual Disturbances and Yellow Eyes. Respiratory Present- Snoring. Not Present- Bloody sputum, Chronic Cough, Difficulty Breathing and Wheezing. Breast Present- Breast Mass and Breast Pain. Not Present- Nipple Discharge and Skin Changes. Cardiovascular Present- Chest Pain and Leg Cramps. Not Present- Difficulty Breathing Lying Down, Palpitations, Rapid Heart Rate, Shortness of Breath and Swelling of Extremities. Gastrointestinal Present- Change in Bowel Habits and Indigestion. Not Present- Abdominal Pain, Bloating, Bloody Stool, Chronic diarrhea, Constipation, Difficulty Swallowing, Excessive gas, Gets full quickly at meals, Hemorrhoids, Nausea, Rectal Pain and Vomiting. Female Genitourinary Not Present- Frequency, Nocturia, Painful Urination, Pelvic Pain and Urgency. Musculoskeletal Present- Back Pain and Joint Stiffness. Not Present- Joint Pain, Muscle Pain, Muscle Weakness and Swelling of Extremities. Neurological Present- Tingling. Not Present- Decreased Memory, Fainting, Headaches, Numbness, Seizures, Tremor, Trouble walking and Weakness. Psychiatric Not Present- Anxiety, Bipolar, Change in Sleep Pattern, Depression, Fearful and Frequent crying. Endocrine Not Present- Cold Intolerance, Excessive Hunger, Hair Changes, Heat Intolerance, Hot flashes and New Diabetes. Hematology Present- Blood Thinners. Not Present- Easy Bruising, Excessive bleeding, Gland problems, HIV and Persistent Infections.   Physical Exam General Mental Status-Alert. General Appearance-Consistent with stated age. Hydration-Well hydrated. Voice-Normal.  Head and Neck Head-normocephalic, atraumatic with no lesions or palpable  masses. Trachea-midline. Thyroid Gland Characteristics - normal size and consistency.  Eye Eyeball - Bilateral-Extraocular movements intact. Sclera/Conjunctiva - Bilateral-No  scleral icterus.  Chest and Lung Exam Chest and lung exam reveals -quiet, even and easy respiratory effort with no use of accessory muscles and on auscultation, normal breath sounds, no adventitious sounds and normal vocal resonance. Inspection Chest Wall - Normal. Back - normal.  Breast Note: There is a large 5cm palpable mass in the upper outer quadrant that is not fixed to the skin or chest wall. There is no palpable mass in the left breast. there is no palpable axillary, supraclavicular, or cervical lymphadenopathy   Cardiovascular Cardiovascular examination reveals -normal heart sounds, regular rate and rhythm with no murmurs and normal pedal pulses bilaterally.  Abdomen Inspection Inspection of the abdomen reveals - No Hernias. Skin - Scar - no surgical scars. Palpation/Percussion Palpation and Percussion of the abdomen reveal - Soft, Non Tender, No Rebound tenderness, No Rigidity (guarding) and No hepatosplenomegaly. Auscultation Auscultation of the abdomen reveals - Bowel sounds normal.  Neurologic Neurologic evaluation reveals -alert and oriented x 3 with no impairment of recent or remote memory. Mental Status-Normal.  Musculoskeletal Normal Exam - Left-Upper Extremity Strength Normal and Lower Extremity Strength Normal. Normal Exam - Right-Upper Extremity Strength Normal and Lower Extremity Strength Normal.  Lymphatic Head & Neck  General Head & Neck Lymphatics: Bilateral - Description - Normal. Axillary  General Axillary Region: Bilateral - Description - Normal. Tenderness - Non Tender. Femoral & Inguinal  Generalized Femoral & Inguinal Lymphatics: Bilateral - Description - Normal. Tenderness - Non Tender.    Assessment & Plan  MALIGNANT NEOPLASM OF UPPER-OUTER  QUADRANT OF RIGHT BREAST IN FEMALE, ESTROGEN RECEPTOR POSITIVE (C50.411) Impression: The patient appears to have a large cancer in the upper outer right breast. At this point I would favor mastectomy and sentinel node mapping if she does not get neoadjuvant therapy. I have discussed with her the risks and benefits of the surgery as well as some of the technical aspects including placing a port and she understands. she is going to get a second opinion and then let us know what she wants   She has had right MRM. She is now ready to have port removed

## 2017-09-08 NOTE — Op Note (Signed)
09/08/2017  2:29 PM  PATIENT:  Casey Olson  67 y.o. female  PRE-OPERATIVE DIAGNOSIS:  right breast cancer  POST-OPERATIVE DIAGNOSIS:  right breast cancer  PROCEDURE:  Procedure(s): REMOVAL PORT-A-CATH (Left)  SURGEON:  Surgeon(s) and Role:    Jovita Kussmaul, MD - Primary  PHYSICIAN ASSISTANT:   ASSISTANTS: none   ANESTHESIA:   local and IV sedation  EBL:  3 mL   BLOOD ADMINISTERED:none  DRAINS: none   LOCAL MEDICATIONS USED:  MARCAINE     SPECIMEN:  No Specimen  DISPOSITION OF SPECIMEN:  N/A  COUNTS:  YES  TOURNIQUET:  * No tourniquets in log *  DICTATION: .Dragon Dictation   After informed consent was obtained the patient was brought to the operating room and placed in the supine position on the operating table.  After adequate IV sedation had been given the patient's left chest wall was prepped with ChloraPrep, allowed to dry, and draped in usual sterile manner.  An appropriate timeout was performed.  A combination of 1% lidocaine and quarter percent Marcaine was used to infiltrate around the port and create a good field block.  A small incision was then made with a 15 blade knife through her old incision.  The incision was carried through the subcutaneous tissue sharply with a 15 blade knife until the capsule surrounding the port was opened.  The 2 anchoring stitches were divided and removed.  With gentle traction the port was then removed from the patient without difficulty.  Pressure was held for several minutes until the area was completely hemostatic.  The deep layer of the wound was then closed with interrupted 3-0 Vicryl stitches.  The skin was then closed with a running 4-0 Monocryl subcuticular stitch.  Dermabond dressings were applied.  The patient tolerated the procedure well.  At the end of the case all needle sponge and instrument counts were correct.  The patient was then awakened and taken to recovery in stable condition.  PLAN OF CARE: Discharge to home  after PACU  PATIENT DISPOSITION:  PACU - hemodynamically stable.   Delay start of Pharmacological VTE agent (>24hrs) due to surgical blood loss or risk of bleeding: not applicable

## 2017-09-08 NOTE — Interval H&P Note (Signed)
History and Physical Interval Note:  09/08/2017 1:59 PM  Casey Olson  has presented today for surgery, with the diagnosis of right breast cancer  The various methods of treatment have been discussed with the patient and family. After consideration of risks, benefits and other options for treatment, the patient has consented to  Procedure(s): REMOVAL PORT-A-CATH (Left) as a surgical intervention .  The patient's history has been reviewed, patient examined, no change in status, stable for surgery.  I have reviewed the patient's chart and labs.  Questions were answered to the patient's satisfaction.     TOTH III,Yania Bogie S

## 2017-09-08 NOTE — Discharge Instructions (Signed)

## 2017-09-08 NOTE — Anesthesia Preprocedure Evaluation (Addendum)
Anesthesia Evaluation  Patient identified by MRN, date of birth, ID band Patient awake    Reviewed: Allergy & Precautions, H&P , NPO status , Patient's Chart, lab work & pertinent test results, reviewed documented beta blocker date and time   Airway Mallampati: II  TM Distance: >3 FB Neck ROM: full    Dental no notable dental hx.    Pulmonary    Pulmonary exam normal breath sounds clear to auscultation       Cardiovascular hypertension,  Rhythm:regular Rate:Normal     Neuro/Psych  Neuromuscular disease    GI/Hepatic GERD  ,  Endo/Other  diabetes  Renal/GU      Musculoskeletal   Abdominal   Peds  Hematology   Anesthesia Other Findings   Reproductive/Obstetrics                             Anesthesia Physical  Anesthesia Plan  ASA: III  Anesthesia Plan: MAC   Post-op Pain Management:    Induction: Intravenous  PONV Risk Score and Plan: 3 and Ondansetron, Dexamethasone, Treatment may vary due to age or medical condition and Propofol infusion  Airway Management Planned:   Additional Equipment:   Intra-op Plan:   Post-operative Plan:   Informed Consent: I have reviewed the patients History and Physical, chart, labs and discussed the procedure including the risks, benefits and alternatives for the proposed anesthesia with the patient or authorized representative who has indicated his/her understanding and acceptance.   Dental Advisory Given and Dental advisory given  Plan Discussed with: CRNA and Surgeon  Anesthesia Plan Comments: (Discussed GA with LMA, possible sore throat, potential need to switch to ETT, N/V, pulmonary aspiration. Questions answered. )       Anesthesia Quick Evaluation

## 2017-09-08 NOTE — Transfer of Care (Signed)
Immediate Anesthesia Transfer of Care Note  Patient: Cecille Mcclusky  Procedure(s) Performed: REMOVAL PORT-A-CATH (Left Chest)  Patient Location: PACU  Anesthesia Type:MAC  Level of Consciousness: sedated  Airway & Oxygen Therapy: Patient Spontanous Breathing and Patient connected to face mask oxygen  Post-op Assessment: Report given to RN and Post -op Vital signs reviewed and stable  Post vital signs: Reviewed and stable  Last Vitals:  Vitals:   09/08/17 1433 09/08/17 1434  BP: 112/65   Pulse:  80  Resp: 17 16  Temp:    SpO2:  100%    Last Pain:  Vitals:   09/08/17 1237  TempSrc: Oral      Patients Stated Pain Goal: 0 (57/01/77 [id number])  Complications: No apparent anesthesia complications

## 2017-09-08 NOTE — Anesthesia Postprocedure Evaluation (Signed)
Anesthesia Post Note  Patient: Casey Olson  Procedure(s) Performed: REMOVAL PORT-A-CATH (Left Chest)     Patient location during evaluation: PACU Anesthesia Type: MAC Level of consciousness: awake and alert Pain management: pain level controlled Vital Signs Assessment: post-procedure vital signs reviewed and stable Respiratory status: spontaneous breathing Cardiovascular status: stable Anesthetic complications: no    Last Vitals:  Vitals:   09/08/17 1500 09/08/17 1524  BP: (!) 143/73 (!) 144/67  Pulse: 75 68  Resp: 18 16  Temp:  36.4 C  SpO2: 97% 96%    Last Pain:  Vitals:   09/08/17 1524  TempSrc:   PainSc: 0-No pain                 Nolon Nations

## 2017-09-09 ENCOUNTER — Encounter (HOSPITAL_BASED_OUTPATIENT_CLINIC_OR_DEPARTMENT_OTHER): Payer: Self-pay | Admitting: General Surgery

## 2017-09-10 ENCOUNTER — Other Ambulatory Visit: Payer: Medicare Other

## 2017-09-22 ENCOUNTER — Ambulatory Visit
Admission: RE | Admit: 2017-09-22 | Discharge: 2017-09-22 | Disposition: A | Discharge status: Home / Self Care | Payer: Medicare Other | Source: Ambulatory Visit | Attending: Adult Health | Admitting: Adult Health

## 2017-09-22 ENCOUNTER — Ambulatory Visit
Admission: RE | Admit: 2017-09-22 | Discharge: 2017-09-22 | Disposition: A | Discharge status: Home / Self Care | Payer: Medicare Other | Source: Ambulatory Visit | Attending: Internal Medicine | Admitting: Internal Medicine

## 2017-09-22 DIAGNOSIS — E2839 Other primary ovarian failure: Secondary | ICD-10-CM

## 2017-09-22 DIAGNOSIS — Z1239 Encounter for other screening for malignant neoplasm of breast: Secondary | ICD-10-CM

## 2018-02-28 NOTE — Assessment & Plan Note (Signed)
07/24/2016: Palpable right breast mass for 2 months UOQ at 10:00: 4.7 x 4.1 x 3.7 cm with enlarged axillary lymph node biopsy benign, breast mass biopsy grade 3 IDC ER 30% week PR negative HER-2 negative Ki-67 90% CT CAP and Bone scan: No mets  Treatment summary  1. Right mastectomy01/24/2018: IDC grade 3, 7 cm, margins negative, 1/17 nodes positive, ER weakly +30%, PR negative, HER-2 negative, Ki-67 90%, T3 N1 stage IIIa 2. Adjuvant chemotherapy with Adriamycin and Cytoxan dose dense 4 followed by Abraxane weekly 12 (Patient cannot state steroids due to diabetes and so we are not using Taxol) started February 2018 completed June 2018 3. Adjuvant radiation therapy started 03/08/2017 to be completed September 2018 4. Followed by adjuvant antiestrogen therapy started 04/08/2017 -------------------------------------------------------------------------------------------------------------------------- Antiestrogen therapy counseling:Anastrozole 1 mg daily started October 2018. Anastrozole Toxicities:  Breast cancer Surveillance: 1. Breast Exam 2. Mammogram  RTC in 1 year for follow up

## 2018-03-01 ENCOUNTER — Inpatient Hospital Stay: Payer: Medicare Other | Attending: Hematology and Oncology | Admitting: Hematology and Oncology

## 2018-03-01 ENCOUNTER — Telehealth: Payer: Self-pay | Admitting: Hematology and Oncology

## 2018-03-01 DIAGNOSIS — Z7982 Long term (current) use of aspirin: Secondary | ICD-10-CM | POA: Insufficient documentation

## 2018-03-01 DIAGNOSIS — Z794 Long term (current) use of insulin: Secondary | ICD-10-CM | POA: Insufficient documentation

## 2018-03-01 DIAGNOSIS — Z9011 Acquired absence of right breast and nipple: Secondary | ICD-10-CM | POA: Diagnosis not present

## 2018-03-01 DIAGNOSIS — R232 Flushing: Secondary | ICD-10-CM | POA: Insufficient documentation

## 2018-03-01 DIAGNOSIS — Z79811 Long term (current) use of aromatase inhibitors: Secondary | ICD-10-CM | POA: Diagnosis not present

## 2018-03-01 DIAGNOSIS — Z79899 Other long term (current) drug therapy: Secondary | ICD-10-CM | POA: Insufficient documentation

## 2018-03-01 DIAGNOSIS — Z9221 Personal history of antineoplastic chemotherapy: Secondary | ICD-10-CM | POA: Diagnosis not present

## 2018-03-01 DIAGNOSIS — Z170 Estrogen receptor positive status [ER+]: Secondary | ICD-10-CM | POA: Insufficient documentation

## 2018-03-01 DIAGNOSIS — C50411 Malignant neoplasm of upper-outer quadrant of right female breast: Secondary | ICD-10-CM | POA: Diagnosis not present

## 2018-03-01 DIAGNOSIS — Z923 Personal history of irradiation: Secondary | ICD-10-CM | POA: Insufficient documentation

## 2018-03-01 MED ORDER — ANASTROZOLE 1 MG PO TABS: 1.0000 mg | ORAL_TABLET | Freq: Every day | ORAL | 3 refills | Status: DC

## 2018-03-01 MED ORDER — ERTUGLIFLOZIN L-PYROGLUTAMICAC 5 MG PO TABS: 5.0000 mg | ORAL_TABLET | Freq: Every day | ORAL | Status: DC

## 2018-03-01 NOTE — Telephone Encounter (Signed)
Gave patient avs and calendar of upcoming July appts.  °

## 2018-03-01 NOTE — Progress Notes (Signed)
Patient Care Team: Nolene Ebbs, MD as PCP - General (Internal Medicine) Jovita Kussmaul, MD as Consulting Physician (General Surgery) Nicholas Lose, MD as Consulting Physician (Hematology and Oncology) Kyung Rudd, MD as Consulting Physician (Radiation Oncology) Delice Bison Charlestine Massed, NP as Nurse Practitioner (Hematology and Oncology)  DIAGNOSIS:  Encounter Diagnosis  Name Primary?  . Malignant neoplasm of upper-outer quadrant of right breast in female, estrogen receptor positive ([place])     SUMMARY OF ONCOLOGIC HISTORY:   Malignant neoplasm of upper-outer quadrant of right breast in female, estrogen receptor positive ([place])   07/24/2016 Initial Biopsy    Right breast core biopsy, 10 o'clock: IDC, grade 3, ER+ (30%), PR-, Her-2 negative (ratio 1.36).  1 lymph node biopsied was negative for disease.      08/26/2016 Surgery    Right breast mastectomy Marlou Starks): IDC, grade 3, 7cm, margins negative, 1/17 LN + macrometastases.  T3,N1a      09/25/2016 - 01/06/2017 Chemotherapy    Adriamycin and Cytoxan dose dense 4 followed by Abraxane weekly 12      12/03/2016 Genetic Testing    APC c.2213A>G and POLE c.1064A>G VUS found on the Common Hereditary cancer panel.  The Hereditary Gene Panel offered by [facility] includes sequencing and/or deletion duplication testing of the following 46 genes: APC, ATM, AXIN2, BARD1, BMPR1A, BRCA1, BRCA2, BRIP1, CDH1, CDKN2A (p14ARF), CDKN2A (p16INK4a), CHEK2, CTNNA1, DICER1, EPCAM (Deletion/duplication testing only), GREM1 (promoter region deletion/duplication testing only), KIT, MEN1, MLH1, MSH2, MSH3, MSH6, MUTYH, NBN, NF1, NHTL45mPALB2, PDGFRA, PMS2, POLD1, POLE, PTEN, RAD50, RAD51C, RAD51D, SDHB, SDHC, SDHD, SMAD4, SMARCA4. STK11, TP53, TSC1, TSC2, and VHL.  The following gene was evaluated for sequence changes only: SDHA and HOXB13 c.251G>A variant only.  The report date is Dec 03, 2016.       03/08/2017 - 04/22/2017 Radiation Therapy    Adjuvant radiation  therapy 1. The Right breast was treated to 50.4 Gy in 28 fractions at 1.8 Gy per fraction. The Right breast was boosted to 10 Gy in 5 fractions at 2 Gy per fraction.        04/2017 -  Anti-estrogen oral therapy    Anastrozole daily       CHIEF COMPLIANT: Follow-up on anastrozole therapy  INTERVAL HISTORY: Casey Olson a 89-yearwith above-mentioned history of right breast cancer currently on anastrozole therapy and appears to be tolerating it extremely well.  She has very occasional hot flashes.  She does complain of stiffness in the fingers especially the right hand.  She denies any lumps or nodules.  REVIEW OF SYSTEMS:   Constitutional: Denies fevers, chills or abnormal weight loss Eyes: Denies blurriness of vision Ears, nose, mouth, throat, and face: Denies mucositis or sore throat Respiratory: Denies cough, dyspnea or wheezes Cardiovascular: Denies palpitation, chest discomfort Gastrointestinal:  Denies nausea, heartburn or change in bowel habits Skin: Denies abnormal skin rashes Lymphatics: Denies new lymphadenopathy or easy bruising Neurological:Denies numbness, tingling or new weaknesses Behavioral/Psych: Mood is stable, no new changes  Extremities: No lower extremity edema Breast: Right mastectomy, no palpable lumps or nodules in the left breast All other systems were reviewed with the patient and are negative.  I have reviewed the past medical history, past surgical history, social history and family history with the patient and they are unchanged from previous note.  ALLERGIES:  is allergic to percocet [oxycodone-acetaminophen].  MEDICATIONS:  Current Outpatient Medications  Medication Sig Dispense Refill  . acetaminophen (TYLENOL) 325 MG tablet Take 650 mg by mouth every  6 (six) hours as needed for mild pain.    Marland Kitchen anastrozole (ARIMIDEX) 1 MG tablet Take 1 tablet (1 mg total) by mouth daily. 90 tablet 3  . aspirin EC 81 MG tablet Take 81 mg by mouth daily.    .  BD PEN NEEDLE NANO U/F 32G X 4 MM MISC USE TO INJECT INSULIN EVERY DAY (E11.65)  5  . Blood Glucose Monitoring Suppl (ONETOUCH VERIO FLEX SYSTEM) w/Device KIT USE TO TEST 3 TIMES DAILY DX E11.65  11  . Calcium Carbonate-Vitamin D (CALTRATE 600+D PO) Take 1 tablet by mouth 2 (two) times daily.    Marland Kitchen gabapentin (NEURONTIN) 300 MG capsule Take 300 mg by mouth 3 (three) times daily.    . Hyprom-Naphaz-Polysorb-Zn Sulf (CLEAR EYES COMPLETE OP) Apply 2 drops to eye 2 (two) times daily as needed (irritation).    Marland Kitchen LANTUS SOLOSTAR 100 UNIT/ML Solostar Pen INJECT 10 UNITS IN THE MORNING  5  . loratadine (CLARITIN) 10 MG tablet Take 10 mg by mouth daily as needed for allergies or rhinitis.    Marland Kitchen losartan (COZAAR) 100 MG tablet Take 100 mg by mouth daily.  2  . metFORMIN (GLUCOPHAGE) 1000 MG tablet Take 1,000 mg by mouth 2 (two) times daily.  5  . mirtazapine (REMERON) 15 MG tablet Take 15 mg at bedtime by mouth.    . Multiple Vitamin (MULTIVITAMIN WITH MINERALS) TABS tablet Take 1 tablet by mouth daily.    . non-metallic deodorant Jethro Poling) MISC Apply 1 application topically daily.    Glory Rosebush DELICA LANCETS FINE MISC USE TO TEST 3 TIMES DAILY DX E11.65  11  . ONETOUCH VERIO test strip USE TO TEST 3 TIMES DAILY DX E11.65  11  . simvastatin (ZOCOR) 10 MG tablet Take 10 mg by mouth daily at 6 PM.   2  . traMADol (ULTRAM) 50 MG tablet Take 1-2 tablets (50-100 mg total) by mouth every 6 (six) hours as needed. 15 tablet 0   No current facility-administered medications for this visit.    Facility-Administered Medications Ordered in Other Visits  Medication Dose Route Frequency Provider Last Rate Last Dose  . sodium chloride flush (NS) 0.9 % injection 10 mL  10 mL Intravenous PRN Nicholas Lose, MD   10 mL at 09/25/16 1112    PHYSICAL EXAMINATION: ECOG PERFORMANCE STATUS: 1 - Symptomatic but completely ambulatory  Vitals:   03/01/18 0833  BP: (!) 152/78  Pulse: 82  Resp: 17  Temp: 98.5 F (36.9 C)    SpO2: 99%   Filed Weights   03/01/18 0833  Weight: 168 lb (76.2 kg)    GENERAL:alert, no distress and comfortable SKIN: skin color, texture, turgor are normal, no rashes or significant lesions EYES: normal, Conjunctiva are pink and non-injected, sclera clear OROPHARYNX:no exudate, no erythema and lips, buccal mucosa, and tongue normal  NECK: supple, thyroid normal size, non-tender, without nodularity LYMPH:  no palpable lymphadenopathy in the cervical, axillary or inguinal LUNGS: clear to auscultation and percussion with normal breathing effort HEART: regular rate & rhythm and no murmurs and no lower extremity edema ABDOMEN:abdomen soft, non-tender and normal bowel sounds MUSCULOSKELETAL:no cyanosis of digits and no clubbing  NEURO: alert & oriented x 3 with fluent speech, no focal motor/sensory deficits EXTREMITIES: No lower extremity edema BREAST: Right mastectomy, no palpable lumps or nodules in the left breast (exam performed in the presence of a chaperone)  LABORATORY DATA:  I have reviewed the data as listed CMP Latest Ref Rng &  Units 09/07/2017 04/02/2017 02/05/2017  Glucose 65 - 99 mg/dL 98 49(L) 112  BUN 6 - 20 mg/dL 13 11.6 15.3  Creatinine 0.44 - 1.00 mg/dL 0.50 0.6 0.7  Sodium 135 - 145 mmol/L 142 141 137  Potassium 3.5 - 5.1 mmol/L 4.1 3.4(L) 3.7  Chloride 101 - 111 mmol/L 105 - -  CO2 22 - 32 mmol/L _0 Calcium 8.9 - 10.3 mg/dL 9.4 10.0 9.7  Total Protein 6.4 - 8.3 g/dL - 7.4 7.3  Total Bilirubin 0.20 - 1.20 mg/dL - 0.45 0.40  Alkaline Phos 40 - 150 U/L - 55 64  AST 5 - 34 U/L - 12 13  ALT 0 - 55 U/L - 10 12    Lab Results  Component Value Date   WBC 5.2 04/02/2017   HGB 11.5 (L) 04/02/2017   HCT 35.3 04/02/2017   MCV 84.7 04/02/2017   PLT 214 04/02/2017   NEUTROABS 3.2 04/02/2017    ASSESSMENT & PLAN:  Malignant neoplasm of upper-outer quadrant of right breast in female, estrogen receptor positive ([place]) 07/24/2016: Palpable right breast mass for 2  months UOQ at 10:00: 4.7 x 4.1 x 3.7 cm with enlarged axillary lymph node biopsy benign, breast mass biopsy grade 3 IDC ER 30% week PR negative HER-2 negative Ki-67 90% CT CAP and Bone scan: No mets  Treatment summary  1. Right mastectomy01/24/2018: IDC grade 3, 7 cm, margins negative, 1/17 nodes positive, ER weakly +30%, PR negative, HER-2 negative, Ki-67 90%, T3 N1 stage IIIa 2. Adjuvant chemotherapy with Adriamycin and Cytoxan dose dense 4 followed by Abraxane weekly 12 (Patient cannot state steroids due to diabetes and so we are not using Taxol) started February 2018 completed June 2018 3. Adjuvant radiation therapy started 03/08/2017 to be completed September 2018 4. Followed by adjuvant antiestrogen therapy started 04/08/2017 -------------------------------------------------------------------------------------------------------------------------- Antiestrogen therapy counseling:Anastrozole 1 mg daily started October 2018. Anastrozole Toxicities: 1.  Intermittent hot flashes mild 2. stiffness in the fingers especially the right hand   Breast cancer Surveillance: 1. Breast Exam: 03/01/2018: Left breast benign, right mastectomy 2. Mammogram will be done February 2020  RTC in 1 year for follow up   No orders of the defined types were placed in this encounter.  The patient has a good understanding of the overall plan. she agrees with it. she will call with any problems that may develop before the next visit here.   Harriette Ohara, MD 03/01/18

## 2018-04-27 ENCOUNTER — Other Ambulatory Visit: Payer: Self-pay | Admitting: Hematology and Oncology

## 2018-09-09 ENCOUNTER — Other Ambulatory Visit: Payer: Self-pay | Admitting: Internal Medicine

## 2018-09-09 DIAGNOSIS — Z1231 Encounter for screening mammogram for malignant neoplasm of breast: Secondary | ICD-10-CM

## 2018-10-06 ENCOUNTER — Ambulatory Visit
Admission: RE | Admit: 2018-10-06 | Discharge: 2018-10-06 | Disposition: A | Discharge status: Home / Self Care | Payer: Medicare Other | Source: Ambulatory Visit | Attending: Internal Medicine | Admitting: Internal Medicine

## 2018-10-06 DIAGNOSIS — Z1231 Encounter for screening mammogram for malignant neoplasm of breast: Secondary | ICD-10-CM

## 2019-02-23 ENCOUNTER — Telehealth: Payer: Self-pay | Admitting: Hematology and Oncology

## 2019-02-23 NOTE — Telephone Encounter (Signed)
I talk with patient regarding video visit. Provided my chart support number

## 2019-02-23 NOTE — Assessment & Plan Note (Signed)
07/24/2016: Palpable right breast mass for 2 months UOQ at 10:00: 4.7 x 4.1 x 3.7 cm with enlarged axillary lymph node biopsy benign, breast mass biopsy grade 3 IDC ER 30% week PR negative HER-2 negative Ki-67 90% CT CAP and Bone scan: No mets  Treatmentsummary 1. Right mastectomy01/24/2018: IDC grade 3, 7 cm, margins negative, 1/17 nodes positive, ER weakly +30%, PR negative, HER-2 negative, Ki-67 90%, T3 N1 stage IIIa 2. Adjuvant chemotherapy with Adriamycin and Cytoxan dose dense 4 followed by Abraxane weekly 12 (Patient cannot state steroids due to diabetes and so we are not using Taxol)started February 2018 completed June 2018 3.Adjuvant radiation therapystarted 08/06/2018to be completed September 2018 4. Followed by adjuvant antiestrogen therapystarted 04/08/2017 -------------------------------------------------------------------------------------------------------------------------- Current treatment: Anastrozole 1 mg daily started October 2018. Anastrozole Toxicities: 1.  Intermittent hot flashes mild 2. stiffness in the fingers especially the right hand   Breast cancer Surveillance: 1. Breast Exam: 03/01/2018: Left breast benign, right mastectomy 2. Mammogram  10/07/2018: Benign, breast density category D  RTC in 1 year for follow up

## 2019-02-27 NOTE — Progress Notes (Signed)
HEMATOLOGY-ONCOLOGY [facility] VIDEO VISIT PROGRESS NOTE  I connected with Casey Olson on 02/28/2019 at  8:30 AM EDT by MyChart video conference and verified that I am speaking with the correct person using two identifiers.  I discussed the limitations, risks, security and privacy concerns of performing an evaluation and management service by MyChart and the availability of in person appointments.  I also discussed with the patient that there may be a patient responsible charge related to this service. The patient expressed understanding and agreed to proceed.  Patient's Location: Home Physician Location: Clinic  CHIEF COMPLIANT: Follow-up of right breast cancer on anastrozole  INTERVAL HISTORY: Casey Olson is a 68 y.o. female with above-mentioned history of right breast cancer treated with mastectomy, adjuvant chemotherapy, radiation, and who is currently on anti-estrogen therapy with anastrozole. I last saw her a year ago. Left breast mammogram on 10/06/18 showed no evidence of malignancy. She presents over MyChart today for annual follow-up. Tolerating it well  Oncology History  Malignant neoplasm of upper-outer quadrant of right breast in female, estrogen receptor positive ([place])  07/24/2016 Initial Biopsy   Right breast core biopsy, 10 o'clock: IDC, grade 3, ER+ (30%), PR-, Her-2 negative (ratio 1.36).  1 lymph node biopsied was negative for disease.   08/26/2016 Surgery   Right breast mastectomy Casey Olson): IDC, grade 3, 7cm, margins negative, 1/17 LN + macrometastases.  T3,N1a   09/25/2016 - 01/06/2017 Chemotherapy   Adriamycin and Cytoxan dose dense 4 followed by Abraxane weekly 12   12/03/2016 Genetic Testing   APC c.2213A>G and POLE c.1064A>G VUS found on the Common Hereditary cancer panel.  The Hereditary Gene Panel offered by [facility] includes sequencing and/or deletion duplication testing of the following 46 genes: APC, ATM, AXIN2, BARD1, BMPR1A, BRCA1, BRCA2, BRIP1, CDH1, CDKN2A  (p14ARF), CDKN2A (p16INK4a), CHEK2, CTNNA1, DICER1, EPCAM (Deletion/duplication testing only), GREM1 (promoter region deletion/duplication testing only), KIT, MEN1, MLH1, MSH2, MSH3, MSH6, MUTYH, NBN, NF1, NHTL74mPALB2, PDGFRA, PMS2, POLD1, POLE, PTEN, RAD50, RAD51C, RAD51D, SDHB, SDHC, SDHD, SMAD4, SMARCA4. STK11, TP53, TSC1, TSC2, and VHL.  The following gene was evaluated for sequence changes only: SDHA and HOXB13 c.251G>A variant only.  The report date is Dec 03, 2016.    03/08/2017 - 04/22/2017 Radiation Therapy   Adjuvant radiation therapy 1. The Right breast was treated to 50.4 Gy in 28 fractions at 1.8 Gy per fraction. The Right breast was boosted to 10 Gy in 5 fractions at 2 Gy per fraction.   04/2017 -  Anti-estrogen oral therapy   Anastrozole daily     REVIEW OF SYSTEMS:   Constitutional: Denies fevers, chills or abnormal weight loss Eyes: Denies blurriness of vision Ears, nose, mouth, throat, and face: Denies mucositis or sore throat Respiratory: Denies cough, dyspnea or wheezes Cardiovascular: Denies palpitation, chest discomfort Gastrointestinal:  Denies nausea, heartburn or change in bowel habits Skin: Denies abnormal skin rashes Lymphatics: Denies new lymphadenopathy or easy bruising Neurological:Denies numbness, tingling or new weaknesses Behavioral/Psych: Mood is stable, no new changes  Extremities: No lower extremity edema Breast: denies any pain or lumps or nodules in either breasts All other systems were reviewed with the patient and are negative.  Observations/Objective:  There were no vitals filed for this visit. There is no height or weight on file to calculate BMI.  I have reviewed the data as listed CMP Latest Ref Rng & Units 09/07/2017 04/02/2017 02/05/2017  Glucose 65 - 99 mg/dL 98 49(L) 112  BUN 6 - 20 mg/dL 13 11.6 15.3  Creatinine  0.44 - 1.00 mg/dL 0.50 0.6 0.7  Sodium 135 - 145 mmol/L 142 141 137  Potassium 3.5 - 5.1 mmol/L 4.1 3.4(L) 3.7  Chloride 101 - 111  mmol/L 105 - -  CO2 22 - 32 mmol/L _0 Calcium 8.9 - 10.3 mg/dL 9.4 10.0 9.7  Total Protein 6.4 - 8.3 g/dL - 7.4 7.3  Total Bilirubin 0.20 - 1.20 mg/dL - 0.45 0.40  Alkaline Phos 40 - 150 U/L - 55 64  AST 5 - 34 U/L - 12 13  ALT 0 - 55 U/L - 10 12    Lab Results  Component Value Date   WBC 5.2 04/02/2017   HGB 11.5 (L) 04/02/2017   HCT 35.3 04/02/2017   MCV 84.7 04/02/2017   PLT 214 04/02/2017   NEUTROABS 3.2 04/02/2017      Assessment Plan:  Malignant neoplasm of upper-outer quadrant of right breast in female, estrogen receptor positive ([place]) 07/24/2016: Palpable right breast mass for 2 months UOQ at 10:00: 4.7 x 4.1 x 3.7 cm with enlarged axillary lymph node biopsy benign, breast mass biopsy grade 3 IDC ER 30% week PR negative HER-2 negative Ki-67 90% CT CAP and Bone scan: No mets  Treatmentsummary 1. Right mastectomy01/24/2018: IDC grade 3, 7 cm, margins negative, 1/17 nodes positive, ER weakly +30%, PR negative, HER-2 negative, Ki-67 90%, T3 N1 stage IIIa 2. Adjuvant chemotherapy with Adriamycin and Cytoxan dose dense 4 followed by Abraxane weekly 12 (Patient cannot state steroids due to diabetes and so we are not using Taxol)started February 2018 completed June 2018 3.Adjuvant radiation therapystarted 08/06/2018to be completed September 2018 4. Followed by adjuvant antiestrogen therapystarted 04/08/2017 -------------------------------------------------------------------------------------------------------------------------- Current treatment: Anastrozole 1 mg daily started October 2018. Anastrozole Toxicities: 1.  Intermittent hot flashes mild 2. stiffness in the fingers especially the right hand   Breast cancer Surveillance: 1. Breast Exam: 03/01/2018: Left breast benign, right mastectomy 2. Mammogram  10/07/2018: Benign, breast density category D  RTC in 1 year for follow up  I discussed the assessment and treatment plan with the patient. The patient  was provided an opportunity to ask questions and all were answered. The patient agreed with the plan and demonstrated an understanding of the instructions. The patient was advised to call back or seek an in-person evaluation if the symptoms worsen or if the condition fails to improve as anticipated.   I provided 15 minutes of face-to-face MyChart video visit time during this encounter.    Rulon Eisenmenger, MD 02/28/2019   I, Molly Dorshimer, am acting as scribe for Nicholas Lose, MD.  I have reviewed the above documentation for accuracy and completeness, and I agree with the above.

## 2019-02-28 ENCOUNTER — Inpatient Hospital Stay: Payer: Medicare Other | Attending: Hematology and Oncology | Admitting: Hematology and Oncology

## 2019-02-28 DIAGNOSIS — C50411 Malignant neoplasm of upper-outer quadrant of right female breast: Secondary | ICD-10-CM | POA: Diagnosis not present

## 2019-02-28 DIAGNOSIS — Z923 Personal history of irradiation: Secondary | ICD-10-CM

## 2019-02-28 DIAGNOSIS — Z170 Estrogen receptor positive status [ER+]: Secondary | ICD-10-CM

## 2019-02-28 DIAGNOSIS — Z79811 Long term (current) use of aromatase inhibitors: Secondary | ICD-10-CM

## 2019-02-28 DIAGNOSIS — Z9221 Personal history of antineoplastic chemotherapy: Secondary | ICD-10-CM

## 2019-02-28 MED ORDER — ANASTROZOLE 1 MG PO TABS: 1.0000 mg | ORAL_TABLET | Freq: Every day | ORAL | 3 refills | Status: DC

## 2019-02-28 MED ORDER — JARDIANCE 10 MG PO TABS: 10.0000 mg | ORAL_TABLET | Freq: Every day | ORAL | Status: DC

## 2019-03-09 ENCOUNTER — Telehealth: Payer: Self-pay | Admitting: Hematology and Oncology

## 2019-03-09 NOTE — Telephone Encounter (Signed)
I left a message regarding schedule  

## 2019-11-13 ENCOUNTER — Other Ambulatory Visit: Payer: Self-pay | Admitting: Internal Medicine

## 2019-11-13 DIAGNOSIS — Z1231 Encounter for screening mammogram for malignant neoplasm of breast: Secondary | ICD-10-CM

## 2019-11-14 ENCOUNTER — Ambulatory Visit
Admission: RE | Admit: 2019-11-14 | Discharge: 2019-11-14 | Disposition: A | Discharge status: Home / Self Care | Payer: Medicare Other | Source: Ambulatory Visit | Attending: Internal Medicine | Admitting: Internal Medicine

## 2019-11-14 ENCOUNTER — Other Ambulatory Visit: Payer: Self-pay

## 2019-11-14 DIAGNOSIS — Z1231 Encounter for screening mammogram for malignant neoplasm of breast: Secondary | ICD-10-CM

## 2020-02-27 ENCOUNTER — Ambulatory Visit: Payer: Medicare Other | Admitting: Hematology and Oncology

## 2020-02-27 NOTE — Progress Notes (Signed)
Patient Care Team: Nolene Ebbs, MD as PCP - General (Internal Medicine) Jovita Kussmaul, MD as Consulting Physician (General Surgery) Nicholas Lose, MD as Consulting Physician (Hematology and Oncology) Kyung Rudd, MD as Consulting Physician (Radiation Oncology) Gardenia Phlegm, NP as Nurse Practitioner (Hematology and Oncology)  DIAGNOSIS:    ICD-10-CM   1. Malignant neoplasm of upper-outer quadrant of right breast in female, estrogen receptor positive ([place])  C50.411    Z17.0     SUMMARY OF ONCOLOGIC HISTORY: Oncology History  Malignant neoplasm of upper-outer quadrant of right breast in female, estrogen receptor positive ([place])  07/24/2016 Initial Biopsy   Right breast core biopsy, 10 o'clock: IDC, grade 3, ER+ (30%), PR-, Her-2 negative (ratio 1.36).  1 lymph node biopsied was negative for disease.   08/26/2016 Surgery   Right breast mastectomy Marlou Starks): IDC, grade 3, 7cm, margins negative, 1/17 LN + macrometastases.  T3,N1a   09/25/2016 - 01/06/2017 Chemotherapy   Adriamycin and Cytoxan dose dense 4 followed by Abraxane weekly 12   12/03/2016 Genetic Testing   APC c.2213A>G and POLE c.1064A>G VUS found on the Common Hereditary cancer panel.  The Hereditary Gene Panel offered by [facility] includes sequencing and/or deletion duplication testing of the following 46 genes: APC, ATM, AXIN2, BARD1, BMPR1A, BRCA1, BRCA2, BRIP1, CDH1, CDKN2A (p14ARF), CDKN2A (p16INK4a), CHEK2, CTNNA1, DICER1, EPCAM (Deletion/duplication testing only), GREM1 (promoter region deletion/duplication testing only), KIT, MEN1, MLH1, MSH2, MSH3, MSH6, MUTYH, NBN, NF1, NHTL30mPALB2, PDGFRA, PMS2, POLD1, POLE, PTEN, RAD50, RAD51C, RAD51D, SDHB, SDHC, SDHD, SMAD4, SMARCA4. STK11, TP53, TSC1, TSC2, and VHL.  The following gene was evaluated for sequence changes only: SDHA and HOXB13 c.251G>A variant only.  The report date is Dec 03, 2016.    03/08/2017 - 04/22/2017 Radiation Therapy   Adjuvant radiation therapy 1.  The Right breast was treated to 50.4 Gy in 28 fractions at 1.8 Gy per fraction. The Right breast was boosted to 10 Gy in 5 fractions at 2 Gy per fraction.   04/2017 -  Anti-estrogen oral therapy   Anastrozole daily     CHIEF COMPLIANT: Follow-up of right breast cancer on anastrozole  INTERVAL HISTORY: WDennette Faulconeris a 69y.o. with above-mentioned history of right breast cancer treated with mastectomy, adjuvant chemotherapy, radiation, and who is currently on anti-estrogen therapy with anastrozole. Left breast mammogram on 11/14/19 showed no evidence of malignancy. She presents to the clinic today for annual follow-up.   ALLERGIES:  is allergic to percocet [oxycodone-acetaminophen].  MEDICATIONS:  Current Outpatient Medications  Medication Sig Dispense Refill  . acetaminophen (TYLENOL) 325 MG tablet Take 650 mg by mouth every 6 (six) hours as needed for mild pain.    .Marland Kitchenanastrozole (ARIMIDEX) 1 MG tablet Take 1 tablet (1 mg total) by mouth daily. 90 tablet 3  . aspirin EC 81 MG tablet Take 81 mg by mouth daily.    . Blood Glucose Monitoring Suppl (ONETOUCH VERIO FLEX SYSTEM) w/Device KIT USE TO TEST 3 TIMES DAILY DX E11.65  11  . Calcium Carbonate-Vitamin D (CALTRATE 600+D PO) Take 1 tablet by mouth 2 (two) times daily.    . empagliflozin (JARDIANCE) 25 MG TABS tablet Take 1 tablet (25 mg total) by mouth daily.    .Marland Kitchengabapentin (NEURONTIN) 300 MG capsule Take 300 mg by mouth 3 (three) times daily.    .Marland Kitchenlosartan (COZAAR) 100 MG tablet Take 100 mg by mouth daily.  2  . metFORMIN (GLUCOPHAGE) 1000 MG tablet Take 1,000 mg by mouth 2 (two)  times daily.  5  . Multiple Vitamin (MULTIVITAMIN WITH MINERALS) TABS tablet Take 1 tablet by mouth daily.    Glory Rosebush DELICA LANCETS FINE MISC USE TO TEST 3 TIMES DAILY DX E11.65  11  . ONETOUCH VERIO test strip USE TO TEST 3 TIMES DAILY DX E11.65  11  . simvastatin (ZOCOR) 10 MG tablet Take 10 mg by mouth daily at 6 PM.   2   No current  facility-administered medications for this visit.   Facility-Administered Medications Ordered in Other Visits  Medication Dose Route Frequency Provider Last Rate Last Admin  . sodium chloride flush (NS) 0.9 % injection 10 mL  10 mL Intravenous PRN Nicholas Lose, MD   10 mL at 09/25/16 1112    PHYSICAL EXAMINATION: ECOG PERFORMANCE STATUS: 1 - Symptomatic but completely ambulatory  Vitals:   02/28/20 1110  BP: (!) 153/65  Pulse: 67  Resp: 18  Temp: 98.7 F (37.1 C)  SpO2: 100%   Filed Weights   02/28/20 1110  Weight: 175 lb 4.8 oz (79.5 kg)    BREAST: No palpable masses or nodules in either right or left breasts. No palpable axillary supraclavicular or infraclavicular adenopathy no breast tenderness or nipple discharge. (exam performed in the presence of a chaperone)  LABORATORY DATA:  I have reviewed the data as listed CMP Latest Ref Rng & Units 09/07/2017 04/02/2017 02/05/2017  Glucose 65 - 99 mg/dL 98 49(L) 112  BUN 6 - 20 mg/dL 13 11.6 15.3  Creatinine 0.44 - 1.00 mg/dL 0.50 0.6 0.7  Sodium 135 - 145 mmol/L 142 141 137  Potassium 3.5 - 5.1 mmol/L 4.1 3.4(L) 3.7  Chloride 101 - 111 mmol/L 105 - -  CO2 22 - 32 mmol/L '25 28 26  ' Calcium 8.9 - 10.3 mg/dL 9.4 10.0 9.7  Total Protein 6.4 - 8.3 g/dL - 7.4 7.3  Total Bilirubin 0.20 - 1.20 mg/dL - 0.45 0.40  Alkaline Phos 40 - 150 U/L - 55 64  AST 5 - 34 U/L - 12 13  ALT 0 - 55 U/L - 10 12    Lab Results  Component Value Date   WBC 5.2 04/02/2017   HGB 11.5 (L) 04/02/2017   HCT 35.3 04/02/2017   MCV 84.7 04/02/2017   PLT 214 04/02/2017   NEUTROABS 3.2 04/02/2017    ASSESSMENT & PLAN:  Malignant neoplasm of upper-outer quadrant of right breast in female, estrogen receptor positive ([place]) 07/24/2016: Palpable right breast mass for 2 months UOQ at 10:00: 4.7 x 4.1 x 3.7 cm with enlarged axillary lymph node biopsy benign, breast mass biopsy grade 3 IDC ER 30% week PR negative HER-2 negative Ki-67 90% CT CAP and Bone scan: No  mets  Treatmentsummary 1. Right mastectomy01/24/2018: IDC grade 3, 7 cm, margins negative, 1/17 nodes positive, ER weakly +30%, PR negative, HER-2 negative, Ki-67 90%, T3 N1 stage IIIa 2. Adjuvant chemotherapy with Adriamycin and Cytoxan dose dense 4 followed by Abraxane weekly 12 (Patient cannot state steroids due to diabetes and so we are not using Taxol)started February 2018 completed June 2018 3.Adjuvant radiation therapystarted 08/06/2018to be completed September 2018 4. Followed by adjuvant antiestrogen therapystarted 04/08/2017 -------------------------------------------------------------------------------------------------------------------------- Current treatment: Anastrozole 1 mg dailystartedOctober 2018. Anastrozole Toxicities: 1.Mild hot flashes 2.stiffness of the right hand  Breast cancer Surveillance: 1. Breast Exam: 02/28/2020: Left breast benign, right mastectomy 2. Mammogram  11/14/2019: Benign, breast density category D  RTC in 1 year for follow up    No orders of the defined types  were placed in this encounter.  The patient has a good understanding of the overall plan. she agrees with it. she will call with any problems that may develop before the next visit here.  Total time spent: 20 mins including face to face time and time spent for planning, charting and coordination of care  Nicholas Lose, MD 02/28/2020  I, Cloyde Reams Dorshimer, am acting as scribe for Dr. Nicholas Lose.  I have reviewed the above documentation for accuracy and completeness, and I agree with the above.

## 2020-02-28 ENCOUNTER — Inpatient Hospital Stay: Payer: Medicare Other | Attending: Hematology and Oncology | Admitting: Hematology and Oncology

## 2020-02-28 ENCOUNTER — Other Ambulatory Visit: Payer: Self-pay

## 2020-02-28 DIAGNOSIS — Z923 Personal history of irradiation: Secondary | ICD-10-CM | POA: Insufficient documentation

## 2020-02-28 DIAGNOSIS — Z9221 Personal history of antineoplastic chemotherapy: Secondary | ICD-10-CM | POA: Diagnosis not present

## 2020-02-28 DIAGNOSIS — R232 Flushing: Secondary | ICD-10-CM | POA: Diagnosis not present

## 2020-02-28 DIAGNOSIS — Z9011 Acquired absence of right breast and nipple: Secondary | ICD-10-CM | POA: Insufficient documentation

## 2020-02-28 DIAGNOSIS — Z7984 Long term (current) use of oral hypoglycemic drugs: Secondary | ICD-10-CM | POA: Insufficient documentation

## 2020-02-28 DIAGNOSIS — Z170 Estrogen receptor positive status [ER+]: Secondary | ICD-10-CM | POA: Insufficient documentation

## 2020-02-28 DIAGNOSIS — Z79899 Other long term (current) drug therapy: Secondary | ICD-10-CM | POA: Insufficient documentation

## 2020-02-28 DIAGNOSIS — C50411 Malignant neoplasm of upper-outer quadrant of right female breast: Secondary | ICD-10-CM | POA: Insufficient documentation

## 2020-02-28 DIAGNOSIS — Z79811 Long term (current) use of aromatase inhibitors: Secondary | ICD-10-CM | POA: Insufficient documentation

## 2020-02-28 MED ORDER — ANASTROZOLE 1 MG PO TABS: 1.0000 mg | ORAL_TABLET | Freq: Every day | ORAL | 3 refills | Status: DC

## 2020-02-28 MED ORDER — EMPAGLIFLOZIN 25 MG PO TABS: 25.0000 mg | ORAL_TABLET | Freq: Every day | ORAL | Status: DC

## 2020-02-28 NOTE — Assessment & Plan Note (Signed)
07/24/2016: Palpable right breast mass for 2 months UOQ at 10:00: 4.7 x 4.1 x 3.7 cm with enlarged axillary lymph node biopsy benign, breast mass biopsy grade 3 IDC ER 30% week PR negative HER-2 negative Ki-67 90% CT CAP and Bone scan: No mets  Treatmentsummary 1. Right mastectomy01/24/2018: IDC grade 3, 7 cm, margins negative, 1/17 nodes positive, ER weakly +30%, PR negative, HER-2 negative, Ki-67 90%, T3 N1 stage IIIa 2. Adjuvant chemotherapy with Adriamycin and Cytoxan dose dense 4 followed by Abraxane weekly 12 (Patient cannot state steroids due to diabetes and so we are not using Taxol)started February 2018 completed June 2018 3.Adjuvant radiation therapystarted 08/06/2018to be completed September 2018 4. Followed by adjuvant antiestrogen therapystarted 04/08/2017 -------------------------------------------------------------------------------------------------------------------------- Current treatment: Anastrozole 1 mg dailystartedOctober 2018. Anastrozole Toxicities: 1.Intermittent hot flashes mild 2.stiffness in the fingers especially the right hand   Breast cancer Surveillance: 1. Breast Exam: 02/28/2020: Left breast benign, right mastectomy 2. Mammogram  11/14/2019: Benign, breast density category D  RTC in 1 year for follow up

## 2020-02-29 ENCOUNTER — Telehealth: Payer: Self-pay | Admitting: Hematology and Oncology

## 2020-02-29 NOTE — Telephone Encounter (Signed)
Scheduled per 7/28 los. Called and spoke with pt, confirmed 7/28 appt

## 2020-09-30 ENCOUNTER — Other Ambulatory Visit: Payer: Self-pay | Admitting: Internal Medicine

## 2020-09-30 DIAGNOSIS — Z1231 Encounter for screening mammogram for malignant neoplasm of breast: Secondary | ICD-10-CM

## 2020-09-30 DIAGNOSIS — Z9011 Acquired absence of right breast and nipple: Secondary | ICD-10-CM

## 2020-09-30 DIAGNOSIS — Z853 Personal history of malignant neoplasm of breast: Secondary | ICD-10-CM

## 2020-11-21 ENCOUNTER — Ambulatory Visit
Admission: RE | Admit: 2020-11-21 | Discharge: 2020-11-21 | Disposition: A | Discharge status: Home / Self Care | Payer: Medicare Other | Source: Ambulatory Visit | Attending: Internal Medicine | Admitting: Internal Medicine

## 2020-11-21 ENCOUNTER — Other Ambulatory Visit: Payer: Self-pay

## 2020-11-21 DIAGNOSIS — Z853 Personal history of malignant neoplasm of breast: Secondary | ICD-10-CM

## 2020-11-21 DIAGNOSIS — Z1231 Encounter for screening mammogram for malignant neoplasm of breast: Secondary | ICD-10-CM

## 2020-11-21 DIAGNOSIS — Z9011 Acquired absence of right breast and nipple: Secondary | ICD-10-CM

## 2021-01-01 ENCOUNTER — Telehealth: Payer: Self-pay | Admitting: Hematology and Oncology

## 2021-01-01 NOTE — Telephone Encounter (Signed)
R/s per prov pal, per 7/27 los, pt aware

## 2021-02-27 ENCOUNTER — Ambulatory Visit: Payer: Medicare Other | Admitting: Hematology and Oncology

## 2021-03-05 NOTE — Progress Notes (Signed)
Patient Care Team: Nolene Ebbs, MD as PCP - General (Internal Medicine) Jovita Kussmaul, MD as Consulting Physician (General Surgery) Nicholas Lose, MD as Consulting Physician (Hematology and Oncology) Kyung Rudd, MD as Consulting Physician (Radiation Oncology) Gardenia Phlegm, NP as Nurse Practitioner (Hematology and Oncology)  DIAGNOSIS:    ICD-10-CM   1. Malignant neoplasm of upper-outer quadrant of right breast in female, estrogen receptor positive ([place])  C50.411    Z17.0       SUMMARY OF ONCOLOGIC HISTORY: Oncology History  Malignant neoplasm of upper-outer quadrant of right breast in female, estrogen receptor positive ([place])  07/24/2016 Initial Biopsy   Right breast core biopsy, 10 o'clock: IDC, grade 3, ER+ (30%), PR-, Her-2 negative (ratio 1.36).  1 lymph node biopsied was negative for disease.    08/26/2016 Surgery   Right breast mastectomy Marlou Starks): IDC, grade 3, 7cm, margins negative, 1/17 LN + macrometastases.  T3,N1a    09/25/2016 - 01/06/2017 Chemotherapy   Adriamycin and Cytoxan dose dense 4 followed by Abraxane weekly 12    12/03/2016 Genetic Testing   APC c.2213A>G and POLE c.1064A>G VUS found on the Common Hereditary cancer panel.  The Hereditary Gene Panel offered by [facility] includes sequencing and/or deletion duplication testing of the following 46 genes: APC, ATM, AXIN2, BARD1, BMPR1A, BRCA1, BRCA2, BRIP1, CDH1, CDKN2A (p14ARF), CDKN2A (p16INK4a), CHEK2, CTNNA1, DICER1, EPCAM (Deletion/duplication testing only), GREM1 (promoter region deletion/duplication testing only), KIT, MEN1, MLH1, MSH2, MSH3, MSH6, MUTYH, NBN, NF1, NHTL74mPALB2, PDGFRA, PMS2, POLD1, POLE, PTEN, RAD50, RAD51C, RAD51D, SDHB, SDHC, SDHD, SMAD4, SMARCA4. STK11, TP53, TSC1, TSC2, and VHL.  The following gene was evaluated for sequence changes only: SDHA and HOXB13 c.251G>A variant only.  The report date is Dec 03, 2016.     03/08/2017 - 04/22/2017 Radiation Therapy   Adjuvant radiation  therapy 1. The Right breast was treated to 50.4 Gy in 28 fractions at 1.8 Gy per fraction. The Right breast was boosted to 10 Gy in 5 fractions at 2 Gy per fraction.   04/2017 -  Anti-estrogen oral therapy   Anastrozole daily      CHIEF COMPLIANT:  Follow-up of right breast cancer on anastrozole  INTERVAL HISTORY: Casey Olson a 70y.o. with above-mentioned history of right breast cancer treated with mastectomy, adjuvant chemotherapy, radiation, and who is currently on anti-estrogen therapy with anastrozole. Left breast mammogram on 11/21/20 showed no evidence of malignancy. She presents to the clinic today for annual follow-up.  She reports no trouble taking anastrozole.  Denies any lumps or nodules in the breast.  She has not been exercising frequently and has gained some weight.  ALLERGIES:  is allergic to percocet [oxycodone-acetaminophen].  MEDICATIONS:  Current Outpatient Medications  Medication Sig Dispense Refill   acetaminophen (TYLENOL) 325 MG tablet Take 650 mg by mouth every 6 (six) hours as needed for mild pain.     anastrozole (ARIMIDEX) 1 MG tablet Take 1 tablet (1 mg total) by mouth daily. 90 tablet 3   aspirin EC 81 MG tablet Take 81 mg by mouth daily.     Blood Glucose Monitoring Suppl (ONETOUCH VERIO FLEX SYSTEM) w/Device KIT USE TO TEST 3 TIMES DAILY DX E11.65  11   Calcium Carbonate-Vitamin D (CALTRATE 600+D PO) Take 1 tablet by mouth 2 (two) times daily.     empagliflozin (JARDIANCE) 25 MG TABS tablet Take 1 tablet (25 mg total) by mouth daily.     gabapentin (NEURONTIN) 300 MG capsule Take 300 mg by  mouth 3 (three) times daily.     losartan (COZAAR) 100 MG tablet Take 100 mg by mouth daily.  2   metFORMIN (GLUCOPHAGE) 1000 MG tablet Take 1,000 mg by mouth 2 (two) times daily.  5   Multiple Vitamin (MULTIVITAMIN WITH MINERALS) TABS tablet Take 1 tablet by mouth daily.     ONETOUCH DELICA LANCETS FINE MISC USE TO TEST 3 TIMES DAILY DX E11.65  11   ONETOUCH VERIO  test strip USE TO TEST 3 TIMES DAILY DX E11.65  11   simvastatin (ZOCOR) 10 MG tablet Take 10 mg by mouth daily at 6 PM.   2   No current facility-administered medications for this visit.   Facility-Administered Medications Ordered in Other Visits  Medication Dose Route Frequency Provider Last Rate Last Admin   sodium chloride flush (NS) 0.9 % injection 10 mL  10 mL Intravenous PRN Nicholas Lose, MD   10 mL at 09/25/16 1112    PHYSICAL EXAMINATION: ECOG PERFORMANCE STATUS: 1 - Symptomatic but completely ambulatory  There were no vitals filed for this visit. There were no vitals filed for this visit.  BREAST: Right mastectomy no palpable lumps or nodules.  Left breast no lumps or nodules in the breast or axilla.  No palpable axillary supraclavicular or infraclavicular adenopathy no breast tenderness or nipple discharge. (exam performed in the presence of a chaperone)  LABORATORY DATA:  I have reviewed the data as listed CMP Latest Ref Rng & Units 09/07/2017 04/02/2017 02/05/2017  Glucose 65 - 99 mg/dL 98 49(L) 112  BUN 6 - 20 mg/dL 13 11.6 15.3  Creatinine 0.44 - 1.00 mg/dL 0.50 0.6 0.7  Sodium 135 - 145 mmol/L 142 141 137  Potassium 3.5 - 5.1 mmol/L 4.1 3.4(L) 3.7  Chloride 101 - 111 mmol/L 105 - -  CO2 22 - 32 mmol/L '25 28 26  ' Calcium 8.9 - 10.3 mg/dL 9.4 10.0 9.7  Total Protein 6.4 - 8.3 g/dL - 7.4 7.3  Total Bilirubin 0.20 - 1.20 mg/dL - 0.45 0.40  Alkaline Phos 40 - 150 U/L - 55 64  AST 5 - 34 U/L - 12 13  ALT 0 - 55 U/L - 10 12    Lab Results  Component Value Date   WBC 5.2 04/02/2017   HGB 11.5 (L) 04/02/2017   HCT 35.3 04/02/2017   MCV 84.7 04/02/2017   PLT 214 04/02/2017   NEUTROABS 3.2 04/02/2017    ASSESSMENT & PLAN:  Malignant neoplasm of upper-outer quadrant of right breast in female, estrogen receptor positive ([place]) 07/24/2016: Palpable right breast mass for 2 months UOQ at 10:00: 4.7 x 4.1 x 3.7 cm with enlarged axillary lymph node biopsy benign, breast mass  biopsy grade 3 IDC ER 30% week PR negative HER-2 negative Ki-67 90% CT CAP and Bone scan: No mets   Treatment summary   1. Right mastectomy 08/26/2016: IDC grade 3, 7 cm, margins negative, 1/17 nodes positive, ER weakly +30%, PR negative, HER-2 negative, Ki-67 90%, T3 N1 stage IIIa 2. Adjuvant chemotherapy with Adriamycin and Cytoxan dose dense 4 followed by Abraxane weekly 12 (Patient cannot state steroids due to diabetes and so we are not using Taxol) started February 2018 completed June 2018 3. Adjuvant radiation therapy started 03/08/2017 to be completed September 2018 4. Followed by adjuvant antiestrogen therapy started 04/08/2017 -------------------------------------------------------------------------------------------------------------------------- Current treatment: Anastrozole 1 mg daily started October 2018. Anastrozole Toxicities: 1.  Mild hot flashes 2. stiffness of the right hand   Breast cancer Surveillance:  1. Breast Exam: 03/05/21: Left breast benign, right mastectomy 2. Mammogram  11/21/20: Benign, breast density category D   RTC in 1 year for follow up    No orders of the defined types were placed in this encounter.  The patient has a good understanding of the overall plan. she agrees with it. she will call with any problems that may develop before the next visit here.  Total time spent: 20 mins including face to face time and time spent for planning, charting and coordination of care  Rulon Eisenmenger, MD, MPH 03/06/2021  I, Thana Ates, am acting as scribe for Dr. Nicholas Lose.  I have reviewed the above documentation for accuracy and completeness, and I agree with the above.

## 2021-03-05 NOTE — Assessment & Plan Note (Signed)
07/24/2016: Palpable right breast mass for 2 months UOQ at 10:00: 4.7 x 4.1 x 3.7 cm with enlarged axillary lymph node biopsy benign, breast mass biopsy grade 3 IDC ER 30% week PR negative HER-2 negative Ki-67 90% CT CAP and Bone scan: No mets  Treatmentsummary 1. Right mastectomy01/24/2018: IDC grade 3, 7 cm, margins negative, 1/17 nodes positive, ER weakly +30%, PR negative, HER-2 negative, Ki-67 90%, T3 N1 stage IIIa 2. Adjuvant chemotherapy with Adriamycin and Cytoxan dose dense 4 followed by Abraxane weekly 12 (Patient cannot state steroids due to diabetes and so we are not using Taxol)started February 2018 completed June 2018 3.Adjuvant radiation therapystarted 08/06/2018to be completed September 2018 4. Followed by adjuvant antiestrogen therapystarted 04/08/2017 -------------------------------------------------------------------------------------------------------------------------- Current treatment: Anastrozole 1 mg dailystartedOctober 2018. Anastrozole Toxicities: 1.Mild hot flashes 2.stiffness of the right hand  Breast cancer Surveillance: 1. Breast Exam: 03/05/21: Left breast benign, right mastectomy 2. Mammogram4/21/22: Benign, breast density category D  RTC in 1 year for follow up

## 2021-03-06 ENCOUNTER — Inpatient Hospital Stay: Payer: Medicare Other | Attending: Hematology and Oncology | Admitting: Hematology and Oncology

## 2021-03-06 ENCOUNTER — Other Ambulatory Visit: Payer: Self-pay

## 2021-03-06 DIAGNOSIS — Z923 Personal history of irradiation: Secondary | ICD-10-CM | POA: Diagnosis not present

## 2021-03-06 DIAGNOSIS — Z79811 Long term (current) use of aromatase inhibitors: Secondary | ICD-10-CM | POA: Diagnosis not present

## 2021-03-06 DIAGNOSIS — Z9221 Personal history of antineoplastic chemotherapy: Secondary | ICD-10-CM | POA: Diagnosis not present

## 2021-03-06 DIAGNOSIS — Z9011 Acquired absence of right breast and nipple: Secondary | ICD-10-CM | POA: Insufficient documentation

## 2021-03-06 DIAGNOSIS — Z7982 Long term (current) use of aspirin: Secondary | ICD-10-CM | POA: Diagnosis not present

## 2021-03-06 DIAGNOSIS — Z170 Estrogen receptor positive status [ER+]: Secondary | ICD-10-CM | POA: Diagnosis not present

## 2021-03-06 DIAGNOSIS — Z79899 Other long term (current) drug therapy: Secondary | ICD-10-CM | POA: Diagnosis not present

## 2021-03-06 DIAGNOSIS — R232 Flushing: Secondary | ICD-10-CM | POA: Insufficient documentation

## 2021-03-06 DIAGNOSIS — C50411 Malignant neoplasm of upper-outer quadrant of right female breast: Secondary | ICD-10-CM | POA: Insufficient documentation

## 2021-03-06 DIAGNOSIS — Z7984 Long term (current) use of oral hypoglycemic drugs: Secondary | ICD-10-CM | POA: Diagnosis not present

## 2021-03-06 MED ORDER — GLIPIZIDE 5 MG PO TABS: 5.0000 mg | ORAL_TABLET | Freq: Every day | ORAL | Status: AC

## 2021-03-06 MED ORDER — ANASTROZOLE 1 MG PO TABS: 1.0000 mg | ORAL_TABLET | Freq: Every day | ORAL | 3 refills | Status: DC

## 2021-06-11 ENCOUNTER — Other Ambulatory Visit: Payer: Self-pay | Admitting: Internal Medicine

## 2021-06-13 LAB — INFLUENZA A AND B AG, IMMUNOASSAY

## 2021-06-13 LAB — SARS-COV-2 RNA,(COVID-19) QUALITATIVE NAAT: SARS CoV2 RNA: NOT DETECTED

## 2021-10-23 ENCOUNTER — Other Ambulatory Visit: Payer: Self-pay | Admitting: Internal Medicine

## 2021-10-23 DIAGNOSIS — Z1231 Encounter for screening mammogram for malignant neoplasm of breast: Secondary | ICD-10-CM

## 2021-11-24 ENCOUNTER — Ambulatory Visit
Admission: RE | Admit: 2021-11-24 | Discharge: 2021-11-24 | Disposition: A | Discharge status: Home / Self Care | Payer: Medicare Other | Source: Ambulatory Visit | Attending: Internal Medicine | Admitting: Internal Medicine

## 2021-11-24 DIAGNOSIS — Z1231 Encounter for screening mammogram for malignant neoplasm of breast: Secondary | ICD-10-CM
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
## 2022-03-09 ENCOUNTER — Inpatient Hospital Stay: Payer: Medicare Other | Attending: Hematology and Oncology | Admitting: Hematology and Oncology

## 2022-03-09 DIAGNOSIS — Z9011 Acquired absence of right breast and nipple: Secondary | ICD-10-CM | POA: Insufficient documentation

## 2022-03-09 DIAGNOSIS — R232 Flushing: Secondary | ICD-10-CM | POA: Insufficient documentation

## 2022-03-09 DIAGNOSIS — Z9221 Personal history of antineoplastic chemotherapy: Secondary | ICD-10-CM | POA: Diagnosis not present

## 2022-03-09 DIAGNOSIS — Z7982 Long term (current) use of aspirin: Secondary | ICD-10-CM | POA: Diagnosis not present

## 2022-03-09 DIAGNOSIS — Z923 Personal history of irradiation: Secondary | ICD-10-CM | POA: Diagnosis not present

## 2022-03-09 DIAGNOSIS — Z7984 Long term (current) use of oral hypoglycemic drugs: Secondary | ICD-10-CM | POA: Insufficient documentation

## 2022-03-09 DIAGNOSIS — M25641 Stiffness of right hand, not elsewhere classified: Secondary | ICD-10-CM | POA: Insufficient documentation

## 2022-03-09 DIAGNOSIS — Z79811 Long term (current) use of aromatase inhibitors: Secondary | ICD-10-CM | POA: Diagnosis not present

## 2022-03-09 DIAGNOSIS — Z79899 Other long term (current) drug therapy: Secondary | ICD-10-CM | POA: Diagnosis not present

## 2022-03-09 DIAGNOSIS — M2560 Stiffness of unspecified joint, not elsewhere classified: Secondary | ICD-10-CM | POA: Diagnosis not present

## 2022-03-09 DIAGNOSIS — C50411 Malignant neoplasm of upper-outer quadrant of right female breast: Secondary | ICD-10-CM | POA: Diagnosis present

## 2022-03-09 DIAGNOSIS — Z170 Estrogen receptor positive status [ER+]: Secondary | ICD-10-CM

## 2022-03-09 MED ORDER — ANASTROZOLE 1 MG PO TABS: 1.0000 mg | ORAL_TABLET | Freq: Every day | ORAL | 3 refills | Status: DC
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
## 2022-04-11 ENCOUNTER — Encounter (HOSPITAL_COMMUNITY): Payer: Self-pay

## 2022-04-11 ENCOUNTER — Other Ambulatory Visit: Payer: Self-pay

## 2022-04-11 ENCOUNTER — Emergency Department (HOSPITAL_COMMUNITY)
Admission: EM | Admit: 2022-04-11 | Discharge: 2022-04-11 | Disposition: A | Discharge status: Home / Self Care | Payer: Medicare Other | Attending: Emergency Medicine | Admitting: Emergency Medicine

## 2022-04-11 DIAGNOSIS — R519 Headache, unspecified: Secondary | ICD-10-CM | POA: Diagnosis not present

## 2022-04-11 DIAGNOSIS — Z7982 Long term (current) use of aspirin: Secondary | ICD-10-CM | POA: Insufficient documentation

## 2022-04-11 DIAGNOSIS — M546 Pain in thoracic spine: Secondary | ICD-10-CM | POA: Diagnosis not present

## 2022-04-11 DIAGNOSIS — M542 Cervicalgia: Secondary | ICD-10-CM | POA: Diagnosis present

## 2022-04-11 DIAGNOSIS — Y92410 Unspecified street and highway as the place of occurrence of the external cause: Secondary | ICD-10-CM | POA: Insufficient documentation

## 2022-04-11 DIAGNOSIS — M7918 Myalgia, other site: Secondary | ICD-10-CM

## 2022-04-11 DIAGNOSIS — M545 Low back pain, unspecified: Secondary | ICD-10-CM | POA: Diagnosis not present

## 2022-04-11 MED ORDER — LIDOCAINE 5 % EX PTCH
1.0000 | MEDICATED_PATCH | CUTANEOUS | Status: DC
  Administered 2022-04-11: 1 via TRANSDERMAL
  Filled 2022-04-11: qty 1

## 2022-04-11 MED ORDER — LIDOCAINE 5 % EX PTCH: 1.0000 | MEDICATED_PATCH | CUTANEOUS | 0 refills | Status: AC

## 2022-04-11 MED ORDER — ACETAMINOPHEN 325 MG PO TABS
650.0000 mg | ORAL_TABLET | Freq: Once | ORAL | Status: AC
  Administered 2022-04-11: 650 mg via ORAL
  Filled 2022-04-11: qty 2

## 2022-04-11 NOTE — Discharge Instructions (Addendum)
Please stay with someone throughout the night and return if headache is worsening/worse headache of your life or you have changes in mental status, such as increased confusion.Take tylenol for pain control.
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
## 2022-11-09 ENCOUNTER — Other Ambulatory Visit: Payer: Self-pay | Admitting: Internal Medicine

## 2022-11-09 DIAGNOSIS — Z1231 Encounter for screening mammogram for malignant neoplasm of breast: Secondary | ICD-10-CM

## 2022-12-17 ENCOUNTER — Ambulatory Visit
Admission: RE | Admit: 2022-12-17 | Discharge: 2022-12-17 | Disposition: A | Discharge status: Home / Self Care | Payer: Medicare Other | Source: Ambulatory Visit | Attending: Internal Medicine | Admitting: Internal Medicine

## 2022-12-17 DIAGNOSIS — Z1231 Encounter for screening mammogram for malignant neoplasm of breast: Secondary | ICD-10-CM

## 2023-02-06 ENCOUNTER — Other Ambulatory Visit: Payer: Self-pay | Admitting: Hematology and Oncology

## 2023-02-08 ENCOUNTER — Encounter: Payer: Self-pay | Admitting: Hematology and Oncology

## 2023-03-10 ENCOUNTER — Telehealth: Payer: Self-pay | Admitting: Hematology and Oncology

## 2023-03-10 ENCOUNTER — Ambulatory Visit: Payer: Medicare Other | Admitting: Hematology and Oncology

## 2023-03-11 ENCOUNTER — Inpatient Hospital Stay: Payer: Medicare Other | Admitting: Hematology and Oncology

## 2023-03-15 ENCOUNTER — Other Ambulatory Visit: Payer: Self-pay

## 2023-03-15 ENCOUNTER — Inpatient Hospital Stay: Payer: Medicare Other | Attending: Hematology and Oncology | Admitting: Hematology and Oncology

## 2023-03-15 VITALS — BP 144/65 | HR 76 | Temp 97.9°F | Resp 16 | Wt 176.6 lb

## 2023-03-15 DIAGNOSIS — Z9011 Acquired absence of right breast and nipple: Secondary | ICD-10-CM | POA: Diagnosis not present

## 2023-03-15 DIAGNOSIS — Z923 Personal history of irradiation: Secondary | ICD-10-CM | POA: Diagnosis not present

## 2023-03-15 DIAGNOSIS — Z170 Estrogen receptor positive status [ER+]: Secondary | ICD-10-CM | POA: Diagnosis not present

## 2023-03-15 DIAGNOSIS — C50411 Malignant neoplasm of upper-outer quadrant of right female breast: Secondary | ICD-10-CM | POA: Diagnosis present

## 2023-03-15 DIAGNOSIS — Z79811 Long term (current) use of aromatase inhibitors: Secondary | ICD-10-CM | POA: Insufficient documentation

## 2023-03-15 DIAGNOSIS — R232 Flushing: Secondary | ICD-10-CM | POA: Diagnosis not present

## 2023-03-15 DIAGNOSIS — Z9221 Personal history of antineoplastic chemotherapy: Secondary | ICD-10-CM | POA: Diagnosis not present
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
## 2023-03-31 ENCOUNTER — Other Ambulatory Visit: Payer: Self-pay | Admitting: Internal Medicine

## 2023-04-01 LAB — CBC
HCT: 45.2 % — ABNORMAL HIGH (ref 35.0–45.0)
Hemoglobin: 14.2 g/dL (ref 11.7–15.5)
MCH: 27.9 pg (ref 27.0–33.0)
MCHC: 31.4 g/dL — ABNORMAL LOW (ref 32.0–36.0)
MCV: 88.8 fL (ref 80.0–100.0)
MPV: 11.2 fL (ref 7.5–12.5)
Platelets: 258 10*3/uL (ref 140–400)
RBC: 5.09 10*6/uL (ref 3.80–5.10)
RDW: 12.5 % (ref 11.0–15.0)
WBC: 6.9 10*3/uL (ref 3.8–10.8)

## 2023-04-01 LAB — COMPLETE METABOLIC PANEL WITH GFR
AG Ratio: 1.5 (calc) (ref 1.0–2.5)
ALT: 14 U/L (ref 6–29)
AST: 15 U/L (ref 10–35)
Albumin: 4.5 g/dL (ref 3.6–5.1)
Alkaline phosphatase (APISO): 72 U/L (ref 37–153)
BUN: 18 mg/dL (ref 7–25)
CO2: 20 mmol/L (ref 20–32)
Calcium: 9.9 mg/dL (ref 8.6–10.4)
Chloride: 101 mmol/L (ref 98–110)
Creat: 0.72 mg/dL (ref 0.60–1.00)
Globulin: 3.1 g/dL (ref 1.9–3.7)
Glucose, Bld: 143 mg/dL — ABNORMAL HIGH (ref 65–99)
Potassium: 4.2 mmol/L (ref 3.5–5.3)
Sodium: 138 mmol/L (ref 135–146)
Total Bilirubin: 0.6 mg/dL (ref 0.2–1.2)
Total Protein: 7.6 g/dL (ref 6.1–8.1)
eGFR: 89 mL/min/{1.73_m2} (ref >=60)

## 2023-04-01 LAB — LIPID PANEL
Cholesterol: 128 mg/dL (ref <200)
HDL: 56 mg/dL (ref >=50)
LDL Cholesterol (Calc): 59 mg/dL
Non-HDL Cholesterol (Calc): 72 mg/dL (ref <130)
Total CHOL/HDL Ratio: 2.3 (calc) (ref <5.0)
Triglycerides: 58 mg/dL (ref <150)

## 2023-04-01 LAB — TSH: TSH: 1.89 m[IU]/L (ref 0.40–4.50)

## 2023-04-01 LAB — FOLATE: Folate: >24 ng/mL

## 2023-04-01 LAB — VITAMIN B12: Vitamin B-12: 653 pg/mL (ref 200–1100)

## 2023-11-15 ENCOUNTER — Other Ambulatory Visit: Payer: Self-pay | Admitting: Internal Medicine

## 2023-11-15 DIAGNOSIS — Z1231 Encounter for screening mammogram for malignant neoplasm of breast: Secondary | ICD-10-CM

## 2023-12-13 ENCOUNTER — Encounter (HOSPITAL_COMMUNITY): Payer: Self-pay

## 2023-12-20 ENCOUNTER — Ambulatory Visit
Admission: RE | Admit: 2023-12-20 | Discharge: 2023-12-20 | Disposition: A | Discharge status: Home / Self Care | Source: Ambulatory Visit | Attending: Internal Medicine | Admitting: Internal Medicine

## 2023-12-20 DIAGNOSIS — Z1231 Encounter for screening mammogram for malignant neoplasm of breast: Secondary | ICD-10-CM

## 2023-12-31 ENCOUNTER — Other Ambulatory Visit: Payer: Self-pay | Admitting: Hematology and Oncology

## 2024-03-14 ENCOUNTER — Inpatient Hospital Stay: Payer: Medicare Other | Attending: Hematology and Oncology | Admitting: Hematology and Oncology

## 2024-03-14 VITALS — BP 146/68 | HR 70 | Temp 98.1°F | Resp 18 | Ht 60.0 in | Wt 169.3 lb

## 2024-03-14 DIAGNOSIS — R232 Flushing: Secondary | ICD-10-CM | POA: Insufficient documentation

## 2024-03-14 DIAGNOSIS — Z170 Estrogen receptor positive status [ER+]: Secondary | ICD-10-CM | POA: Diagnosis not present

## 2024-03-14 DIAGNOSIS — Z9221 Personal history of antineoplastic chemotherapy: Secondary | ICD-10-CM | POA: Diagnosis not present

## 2024-03-14 DIAGNOSIS — Z1732 Human epidermal growth factor receptor 2 negative status: Secondary | ICD-10-CM | POA: Diagnosis not present

## 2024-03-14 DIAGNOSIS — Z9011 Acquired absence of right breast and nipple: Secondary | ICD-10-CM | POA: Diagnosis not present

## 2024-03-14 DIAGNOSIS — Z1722 Progesterone receptor negative status: Secondary | ICD-10-CM | POA: Insufficient documentation

## 2024-03-14 DIAGNOSIS — Z923 Personal history of irradiation: Secondary | ICD-10-CM | POA: Insufficient documentation

## 2024-03-14 DIAGNOSIS — C50411 Malignant neoplasm of upper-outer quadrant of right female breast: Secondary | ICD-10-CM | POA: Insufficient documentation

## 2024-03-14 DIAGNOSIS — Z79811 Long term (current) use of aromatase inhibitors: Secondary | ICD-10-CM | POA: Diagnosis not present

## 2024-03-14 NOTE — Progress Notes (Signed)
 Patient Care Team: Shelda Atlas, MD as PCP - General (Internal Medicine) Curvin Deward MOULD, MD as Consulting Physician (General Surgery) Odean Potts, MD as Consulting Physician (Hematology and Oncology) Dewey Rush, MD as Consulting Physician (Radiation Oncology) Crawford Morna Pickle, NP as Nurse Practitioner (Hematology and Oncology)  DIAGNOSIS:  Encounter Diagnosis  Name Primary?   Malignant neoplasm of upper-outer quadrant of right breast in female, estrogen receptor positive (HCC) Yes    SUMMARY OF ONCOLOGIC HISTORY: Oncology History  Malignant neoplasm of upper-outer quadrant of right breast in female, estrogen receptor positive (HCC)  07/24/2016 Initial Biopsy   Right breast core biopsy, 10 o'clock: IDC, grade 3, ER+ (30%), PR-, Her-2 negative (ratio 1.36).  1 lymph node biopsied was negative for disease.   08/26/2016 Surgery   Right breast mastectomy [facility]): IDC, grade 3, 7cm, margins negative, 1/17 LN + macrometastases.  T3,N1a   09/25/2016 - 01/06/2017 Chemotherapy   Adriamycin  and Cytoxan  dose dense 4 followed by Abraxane  weekly 12   12/03/2016 Genetic Testing   APC c.2213A>G and POLE c.1064A>G VUS found on the Common Hereditary cancer panel.  The Hereditary Gene Panel offered by [facility] includes sequencing and/or deletion duplication testing of the following 46 genes: APC, ATM, AXIN2, BARD1, BMPR1A, BRCA1, BRCA2, BRIP1, CDH1, CDKN2A (p14ARF), CDKN2A (p16INK4a), CHEK2, CTNNA1, DICER1, EPCAM (Deletion/duplication testing only), GREM1 (promoter region deletion/duplication testing only), KIT, MEN1, MLH1, MSH2, MSH3, MSH6, MUTYH, NBN, NF1, NHTL1m PALB2, PDGFRA, PMS2, POLD1, POLE, PTEN, RAD50, RAD51C, RAD51D, SDHB, SDHC, SDHD, SMAD4, SMARCA4. STK11, TP53, TSC1, TSC2, and VHL.  The following gene was evaluated for sequence changes only: SDHA and HOXB13 c.251G>A variant only.  The report date is Dec 03, 2016.    03/08/2017 - 04/22/2017 Radiation Therapy   Adjuvant radiation therapy  1. The Right breast was treated to 50.4 Gy in 28 fractions at 1.8 Gy per fraction. The Right breast was boosted to 10 Gy in 5 fractions at 2 Gy per fraction.   04/2017 -  Anti-estrogen oral therapy   Anastrozole  daily     CHIEF COMPLIANT: Follow-up on anastrozole  therapy  HISTORY OF PRESENT ILLNESS:   History of Present Illness Casey Olson is a 73 year old female with a history of breast cancer who presents for follow-up after completing seven years of medication therapy.  She has completed seven years of medication therapy for breast cancer and is now discontinuing the medication. Her cancer stage progressed from 2B to 3A. She underwent chemotherapy and radiation, though the exact duration is not recalled. Surgery was performed in January 2018.  She experiences hair loss as a side effect of her treatment, with incomplete regrowth and a persistent bald spot. She manages this by keeping her hair short and using a product to cover the bald spot.  She is currently on Ozempic, administered as a weekly injection, which helps control her appetite. She experiences early satiety and has had weight fluctuations, losing weight during chemotherapy and regaining some, now wearing size eight clothing, which feels tight. She acknowledges the need for more exercise to aid weight loss.     ALLERGIES:  is allergic to percocet [oxycodone -acetaminophen ].  MEDICATIONS:  Current Outpatient Medications  Medication Sig Dispense Refill   acetaminophen  (TYLENOL ) 325 MG tablet Take 650 mg by mouth every 6 (six) hours as needed for mild pain.     anastrozole  (ARIMIDEX ) 1 MG tablet TAKE 1 TABLET BY MOUTH EVERY DAY 90 tablet 3   aspirin  EC 81 MG tablet Take 81 mg by  mouth daily.     Blood Glucose Monitoring Suppl (ONETOUCH VERIO FLEX SYSTEM) w/Device KIT USE TO TEST 3 TIMES DAILY DX E11.65  11   Calcium Carbonate-Vitamin D (CALTRATE 600+D PO) Take 1 tablet by mouth 2 (two) times daily.     empagliflozin   (JARDIANCE ) 25 MG TABS tablet Take 1 tablet (25 mg total) by mouth daily.     gabapentin  (NEURONTIN ) 300 MG capsule Take 300 mg by mouth 3 (three) times daily.     glipiZIDE  (GLUCOTROL ) 5 MG tablet Take 1 tablet (5 mg total) by mouth daily before breakfast.     lidocaine  (LIDODERM ) 5 % Place 1 patch onto the skin daily. Remove & Discard patch within 12 hours or as directed by MD 30 patch 0   losartan  (COZAAR ) 100 MG tablet Take 100 mg by mouth daily.  2   metFORMIN  (GLUCOPHAGE ) 1000 MG tablet Take 1,000 mg by mouth 2 (two) times daily.  5   Multiple Vitamin (MULTIVITAMIN WITH MINERALS) TABS tablet Take 1 tablet by mouth daily.     ONETOUCH DELICA LANCETS FINE MISC USE TO TEST 3 TIMES DAILY DX E11.65  11   ONETOUCH VERIO test strip USE TO TEST 3 TIMES DAILY DX E11.65  11   simvastatin  (ZOCOR ) 10 MG tablet Take 10 mg by mouth daily at 6 PM.   2   No current facility-administered medications for this visit.   Facility-Administered Medications Ordered in Other Visits  Medication Dose Route Frequency Provider Last Rate Last Admin   sodium chloride  flush (NS) 0.9 % injection 10 mL  10 mL Intravenous PRN Odean Potts, MD   10 mL at 09/25/16 1112    PHYSICAL EXAMINATION: ECOG PERFORMANCE STATUS: 1 - Symptomatic but completely ambulatory  Vitals:   03/14/24 1027  BP: (!) 146/68  Pulse: 70  Resp: 18  Temp: 98.1 F (36.7 C)  SpO2: 100%   Filed Weights   03/14/24 1027  Weight: 169 lb 4.8 oz (76.8 kg)      LABORATORY DATA:  I have reviewed the data as listed    Latest Ref Rng & Units 03/31/2023    9:20 AM 09/07/2017   12:07 PM 04/02/2017    9:59 AM  CMP  Glucose 65 - 99 mg/dL 856  98  49   BUN 7 - 25 mg/dL 18  13  88.3   Creatinine 0.60 - 1.00 mg/dL 9.27  9.49  0.6   Sodium 135 - 146 mmol/L 138  142  141   Potassium 3.5 - 5.3 mmol/L 4.2  4.1  3.4   Chloride 98 - 110 mmol/L 101  105    CO2 20 - 32 mmol/L 20  25  28    Calcium 8.6 - 10.4 mg/dL 9.9  9.4  89.9   Total Protein 6.1 - 8.1  g/dL 7.6   7.4   Total Bilirubin 0.2 - 1.2 mg/dL 0.6   9.54   Alkaline Phos 40 - 150 U/L   55   AST 10 - 35 U/L 15   12   ALT 6 - 29 U/L 14   10     Lab Results  Component Value Date   WBC 6.9 03/31/2023   HGB 14.2 03/31/2023   HCT 45.2 (H) 03/31/2023   MCV 88.8 03/31/2023   PLT 258 03/31/2023   NEUTROABS 3.2 04/02/2017    ASSESSMENT & PLAN:  Malignant neoplasm of upper-outer quadrant of right breast in female, estrogen receptor positive (HCC) 07/24/2016: Palpable right  breast mass for 2 months UOQ at 10:00: 4.7 x 4.1 x 3.7 cm with enlarged axillary lymph node biopsy benign, breast mass biopsy grade 3 IDC ER 30% week PR negative HER-2 negative Ki-67 90% CT CAP and Bone scan: No mets   Treatment summary   1. Right mastectomy 08/26/2016: IDC grade 3, 7 cm, margins negative, 1/17 nodes positive, ER weakly +30%, PR negative, HER-2 negative, Ki-67 90%, T3 N1 stage IIIa 2. Adjuvant chemotherapy with Adriamycin  and Cytoxan  dose dense 4 followed by Abraxane  weekly 12 (Patient cannot state steroids due to diabetes and so we are not using Taxol ) started February 2018 completed June 2018 3. Adjuvant radiation therapy started 03/08/2017 to be completed September 2018 4. Followed by adjuvant antiestrogen therapy started 04/08/2017 -------------------------------------------------------------------------------------------------------------------------- Current treatment: Anastrozole  1 mg daily started October 2018. Anastrozole  Toxicities: 1.  Mild hot flashes 2. stiffness of the right hand This concludes her 7 years of antiestrogen therapy with anastrozole .  I recommended that she can discontinue it at this time.   Breast cancer Surveillance: 1. Breast Exam: 03/14/2024: Left breast benign, right mastectomy 2. Mammogram 12/23/2023: Benign, breast density category C     RTC on an as-needed basis    No orders of the defined types were placed in this encounter.  The patient has a good  understanding of the overall plan. she agrees with it. she will call with any problems that may develop before the next visit here. Total time spent: 30 mins including face to face time and time spent for planning, charting and co-ordination of care   Viinay K Teriah Muela, MD 03/14/24

## 2024-03-14 NOTE — Assessment & Plan Note (Signed)
 07/24/2016: Palpable right breast mass for 2 months UOQ at 10:00: 4.7 x 4.1 x 3.7 cm with enlarged axillary lymph node biopsy benign, breast mass biopsy grade 3 IDC ER 30% week PR negative HER-2 negative Ki-67 90% CT CAP and Bone scan: No mets   Treatment summary   1. Right mastectomy 08/26/2016: IDC grade 3, 7 cm, margins negative, 1/17 nodes positive, ER weakly +30%, PR negative, HER-2 negative, Ki-67 90%, T3 N1 stage IIIa 2. Adjuvant chemotherapy with Adriamycin  and Cytoxan  dose dense 4 followed by Abraxane  weekly 12 (Patient cannot state steroids due to diabetes and so we are not using Taxol ) started February 2018 completed June 2018 3. Adjuvant radiation therapy started 03/08/2017 to be completed September 2018 4. Followed by adjuvant antiestrogen therapy started 04/08/2017 -------------------------------------------------------------------------------------------------------------------------- Current treatment: Anastrozole  1 mg daily started October 2018. Anastrozole  Toxicities: 1.  Mild hot flashes 2. stiffness of the right hand This concludes her 7 years of antiestrogen therapy with anastrozole .  I recommended that she can discontinue it at this time.   Breast cancer Surveillance: 1. Breast Exam: 03/14/2024: Left breast benign, right mastectomy 2. Mammogram 12/23/2023: Benign, breast density category C   She has a granddaughter who graduated from college at the age of 59 and she is very proud of her.  Granddaughter has autism and age 46 years old.     RTC in 1 year for follow up

## 2024-08-30 ENCOUNTER — Emergency Department (HOSPITAL_COMMUNITY)

## 2024-08-30 ENCOUNTER — Encounter (HOSPITAL_COMMUNITY): Payer: Self-pay

## 2024-08-30 ENCOUNTER — Inpatient Hospital Stay (HOSPITAL_COMMUNITY)
Admission: EM | Admit: 2024-08-30 | Discharge: 2024-09-03 | DRG: 522 | Disposition: A | Attending: Internal Medicine | Admitting: Internal Medicine

## 2024-08-30 ENCOUNTER — Other Ambulatory Visit: Payer: Self-pay

## 2024-08-30 DIAGNOSIS — W000XXA Fall on same level due to ice and snow, initial encounter: Secondary | ICD-10-CM | POA: Diagnosis present

## 2024-08-30 DIAGNOSIS — Z7984 Long term (current) use of oral hypoglycemic drugs: Secondary | ICD-10-CM

## 2024-08-30 DIAGNOSIS — Z803 Family history of malignant neoplasm of breast: Secondary | ICD-10-CM

## 2024-08-30 DIAGNOSIS — E119 Type 2 diabetes mellitus without complications: Secondary | ICD-10-CM

## 2024-08-30 DIAGNOSIS — Z79899 Other long term (current) drug therapy: Secondary | ICD-10-CM

## 2024-08-30 DIAGNOSIS — Z7982 Long term (current) use of aspirin: Secondary | ICD-10-CM

## 2024-08-30 DIAGNOSIS — E785 Hyperlipidemia, unspecified: Secondary | ICD-10-CM

## 2024-08-30 DIAGNOSIS — S72001A Fracture of unspecified part of neck of right femur, initial encounter for closed fracture: Principal | ICD-10-CM

## 2024-08-30 DIAGNOSIS — Z853 Personal history of malignant neoplasm of breast: Secondary | ICD-10-CM

## 2024-08-30 DIAGNOSIS — Z800 Family history of malignant neoplasm of digestive organs: Secondary | ICD-10-CM

## 2024-08-30 DIAGNOSIS — S72009A Fracture of unspecified part of neck of unspecified femur, initial encounter for closed fracture: Secondary | ICD-10-CM | POA: Diagnosis present

## 2024-08-30 DIAGNOSIS — Z8042 Family history of malignant neoplasm of prostate: Secondary | ICD-10-CM

## 2024-08-30 DIAGNOSIS — I10 Essential (primary) hypertension: Secondary | ICD-10-CM | POA: Diagnosis present

## 2024-08-30 DIAGNOSIS — Z885 Allergy status to narcotic agent status: Secondary | ICD-10-CM

## 2024-08-30 DIAGNOSIS — Z806 Family history of leukemia: Secondary | ICD-10-CM

## 2024-08-30 DIAGNOSIS — K219 Gastro-esophageal reflux disease without esophagitis: Secondary | ICD-10-CM | POA: Diagnosis present

## 2024-08-30 DIAGNOSIS — Y92009 Unspecified place in unspecified non-institutional (private) residence as the place of occurrence of the external cause: Secondary | ICD-10-CM

## 2024-08-30 DIAGNOSIS — Z9011 Acquired absence of right breast and nipple: Secondary | ICD-10-CM

## 2024-08-30 DIAGNOSIS — Z832 Family history of diseases of the blood and blood-forming organs and certain disorders involving the immune mechanism: Secondary | ICD-10-CM

## 2024-08-30 DIAGNOSIS — Z8249 Family history of ischemic heart disease and other diseases of the circulatory system: Secondary | ICD-10-CM

## 2024-08-30 DIAGNOSIS — E1142 Type 2 diabetes mellitus with diabetic polyneuropathy: Secondary | ICD-10-CM | POA: Diagnosis present

## 2024-08-30 DIAGNOSIS — D72829 Elevated white blood cell count, unspecified: Secondary | ICD-10-CM | POA: Diagnosis present

## 2024-08-30 DIAGNOSIS — Z83719 Family history of colon polyps, unspecified: Secondary | ICD-10-CM

## 2024-08-30 DIAGNOSIS — G629 Polyneuropathy, unspecified: Secondary | ICD-10-CM

## 2024-08-30 DIAGNOSIS — E7800 Pure hypercholesterolemia, unspecified: Secondary | ICD-10-CM | POA: Diagnosis present

## 2024-08-30 DIAGNOSIS — Z7985 Long-term (current) use of injectable non-insulin antidiabetic drugs: Secondary | ICD-10-CM

## 2024-08-30 DIAGNOSIS — D62 Acute posthemorrhagic anemia: Secondary | ICD-10-CM | POA: Diagnosis not present

## 2024-08-30 DIAGNOSIS — Z90710 Acquired absence of both cervix and uterus: Secondary | ICD-10-CM

## 2024-08-30 DIAGNOSIS — Z823 Family history of stroke: Secondary | ICD-10-CM

## 2024-08-30 DIAGNOSIS — S72011A Unspecified intracapsular fracture of right femur, initial encounter for closed fracture: Principal | ICD-10-CM | POA: Diagnosis present

## 2024-08-30 DIAGNOSIS — S61432A Puncture wound without foreign body of left hand, initial encounter: Secondary | ICD-10-CM | POA: Diagnosis present

## 2024-08-30 MED ORDER — ONDANSETRON 4 MG PO TBDP
4.0000 mg | ORAL_TABLET | Freq: Once | ORAL | Status: AC
  Administered 2024-08-30: 4 mg via ORAL
  Filled 2024-08-30: qty 1

## 2024-08-30 MED ORDER — ACETAMINOPHEN 325 MG PO TABS
650.0000 mg | ORAL_TABLET | Freq: Once | ORAL | Status: AC
  Administered 2024-08-30: 650 mg via ORAL
  Filled 2024-08-30: qty 2

## 2024-08-30 MED ORDER — FENTANYL CITRATE (PF) 50 MCG/ML IJ SOSY
100.0000 ug | PREFILLED_SYRINGE | Freq: Once | INTRAMUSCULAR | Status: AC
  Administered 2024-08-30: 100 ug via INTRAMUSCULAR
  Filled 2024-08-30: qty 2

## 2024-08-30 MED ORDER — IBUPROFEN 800 MG PO TABS
800.0000 mg | ORAL_TABLET | Freq: Once | ORAL | Status: AC
  Administered 2024-08-30: 800 mg via ORAL
  Filled 2024-08-30: qty 1

## 2024-08-30 NOTE — ED Notes (Signed)
 Pt on recliner in triage. Was transferred over onto stretcher with help of staff in triage. Pt repositioned. Wet pants removed. Foley was place. Coat was removed. Pt unable to tolerate removal of coat.

## 2024-08-30 NOTE — ED Triage Notes (Addendum)
 Pt to ED via [facility] from home c/o mechanical fall, pt slipped on ice , when walking to get mail. Pt reports fell on bottom and now c/o right hip pain. Non ambulatory since fall. No medications given by EMS.  Last VS 150/82, 88hr, 18RR, 98%RA.

## 2024-08-30 NOTE — ED Provider Notes (Signed)
 " [place] EMERGENCY DEPARTMENT AT [facility] Provider Note   CSN: [id number] Arrival date & time: 08/30/24  1641     Patient presents with: Fall and Hip Pain   Casey Olson is a 74 y.o. female.  {Add pertinent medical, surgical, social history, OB history to HPI:32947} Patient is a 74 year old female with past medical history of breast cancer, GERD, hypertension, hyperlipidemia presenting for complaints of right hip and right femur pain post fall.  States she slipped and fell on ice in a parking lot.  States a piece of ice punctured her left palm.  Otherwise denies any spinal pain, head trauma, LOC, nausea, vomiting, or headaches.  Denies sensation or motor dysfunction.  No blood thinner use.  On aspirin  81 mg daily only.  The history is provided by the patient. No language interpreter was used.  Fall Pertinent negatives include no chest pain, no abdominal pain and no shortness of breath.  Hip Pain Pertinent negatives include no chest pain, no abdominal pain and no shortness of breath.       Prior to Admission medications  Medication Sig Start Date End Date Taking? Authorizing Provider  acetaminophen  (TYLENOL ) 325 MG tablet Take 650 mg by mouth every 6 (six) hours as needed for mild pain.    [provider]  aspirin  EC 81 MG tablet Take 81 mg by mouth daily.    [provider]  Blood Glucose Monitoring Suppl (ONETOUCH VERIO FLEX SYSTEM) w/Device KIT USE TO TEST 3 TIMES DAILY DX E11.65 06/12/16   [provider]  Calcium Carbonate-Vitamin D (CALTRATE 600+D PO) Take 1 tablet by mouth 2 (two) times daily.    [provider]  gabapentin  (NEURONTIN ) 300 MG capsule Take 300 mg by mouth 3 (three) times daily.    [provider]  glipiZIDE  (GLUCOTROL ) 5 MG tablet Take 1 tablet (5 mg total) by mouth daily before breakfast. 03/06/21   Odean Potts, MD  lidocaine  (LIDODERM ) 5 % Place 1 patch onto the skin daily. Remove & Discard  patch within 12 hours or as directed by MD 04/11/22   Small, Brooke L, PA  losartan  (COZAAR ) 100 MG tablet Take 100 mg by mouth daily. 11/13/16   [provider]  metFORMIN  (GLUCOPHAGE ) 1000 MG tablet Take 1,000 mg by mouth 2 (two) times daily. 07/13/16   [provider]  Multiple Vitamin (MULTIVITAMIN WITH MINERALS) TABS tablet Take 1 tablet by mouth daily.    [provider]  AISHA PASTOR LANCETS FINE MISC USE TO TEST 3 TIMES DAILY DX E11.65 06/12/16   [provider]  AISHA VERIO test strip USE TO TEST 3 TIMES DAILY DX E11.65 06/12/16   [provider]  simvastatin  (ZOCOR ) 10 MG tablet Take 10 mg by mouth daily at 6 PM.  07/08/16   [provider]    Allergies: Percocet [oxycodone -acetaminophen ]    Review of Systems  Constitutional:  Negative for chills and fever.  HENT:  Negative for ear pain and sore throat.   Eyes:  Negative for pain and visual disturbance.  Respiratory:  Negative for cough and shortness of breath.   Cardiovascular:  Negative for chest pain and palpitations.  Gastrointestinal:  Negative for abdominal pain and vomiting.  Genitourinary:  Negative for dysuria and hematuria.  Musculoskeletal:  Negative for arthralgias and back pain.  Skin:  Negative for color change and rash.  Neurological:  Negative for seizures and syncope.  All other systems reviewed and are negative.  Updated Vital Signs BP 110/89   Pulse 86   Temp 98.7 F (37.1 C) (Oral)   Resp 18   Ht 5' 3 (1.6 m)   Wt 73.5 kg   SpO2 99%   BMI 28.70 kg/m   Physical Exam Vitals and nursing note reviewed.  Constitutional:      General: She is not in acute distress.    Appearance: She is well-developed.  HENT:     Head: Normocephalic and atraumatic.  Eyes:     Conjunctiva/sclera: Conjunctivae normal.  Cardiovascular:     Rate and Rhythm: Normal rate and regular rhythm.     Pulses:          Dorsalis pedis pulses are 2+ on the right side and  2+ on the left side.     Heart sounds: No murmur heard. Pulmonary:     Effort: Pulmonary effort is normal. No respiratory distress.     Breath sounds: Normal breath sounds.  Abdominal:     Palpations: Abdomen is soft.     Tenderness: There is no abdominal tenderness.  Musculoskeletal:        General: No swelling.     Cervical back: Neck supple.     Right hip: Bony tenderness present.     Left hip: Normal.     Right upper leg: Bony tenderness present.     Left upper leg: Normal.     Right knee: Bony tenderness present.     Left knee: Normal.     Right lower leg: Normal.     Left lower leg: Normal.     Right ankle: Normal.     Left ankle: Normal.     Right foot: Normal.     Left foot: Normal.  Skin:    General: Skin is warm and dry.     Capillary Refill: Capillary refill takes less than 2 seconds.  Neurological:     Mental Status: She is alert.     GCS: GCS eye subscore is 4. GCS verbal subscore is 5. GCS motor subscore is 6.     Cranial Nerves: Cranial nerves 2-12 are intact.     Sensory: Sensation is intact.  Psychiatric:        Mood and Affect: Mood normal.     (all labs ordered are listed, but only abnormal results are displayed) Labs Reviewed  CBC WITH DIFFERENTIAL/PLATELET  BASIC METABOLIC PANEL WITH GFR  TYPE AND SCREEN    EKG: None  Radiology: DG Knee 2 Views Right Result Date: 08/30/2024 EXAM: 1 OR 2 VIEW(S) XRAY OF THE RIGHT KNEE 08/30/2024 08:47:00 PM COMPARISON: None available. CLINICAL HISTORY: Right knee pain status post fall. FINDINGS: BONES AND JOINTS: No acute fracture. No malalignment. No significant joint effusion. SOFT TISSUES: Vascular calcifications. IMPRESSION: 1. No acute fracture or dislocation. Electronically signed by: Morgane Naveau MD 08/30/2024 09:06 PM EST RP Workstation: [id number]   DG Femur Min 2 Views Right Result Date: 08/30/2024 EXAM: 2 VIEW(S) XRAY OF THE RIGHT FEMUR 08/30/2024 08:47:00 PM COMPARISON: None available. CLINICAL  HISTORY: Right lateral trochanter pain. FINDINGS: BONES AND JOINTS: Acute mildly displaced right femoral neck fracture with mild apex anterior angulation. No acute fracture of the femur distally. No dislocation of the hip or knee. SOFT TISSUES: Vascular calcifications noted. IMPRESSION: 1. Acute mildly displaced right femoral neck fracture with mild apex anterior angulation. Electronically signed by: Morgane Naveau MD 08/30/2024 09:05 PM EST RP Workstation: [id number]   DG Pelvis 1-2 Views Addendum Date: 08/30/2024 ******** ADDENDUM #  1 ******** ADDENDUM: Acute minimally displaced and impacted right femoral neck fracture. ---------------------------------------------------- Electronically signed by: Morgane Naveau MD 08/30/2024 09:04 PM EST RP Workstation: [id number]   Result Date: 08/30/2024 ******** ORIGINAL REPORT ******** EXAM: 2 or 3 VIEW(S) XRAY OF THE PELVIS 08/30/2024 08:47:00 PM COMPARISON: None available. CLINICAL HISTORY: Fall, right hip pain. FINDINGS: BONES AND JOINTS: No acute fracture. No malalignment. Lower lumbar degenerative change. Bilateral hip degenerative changes. SOFT TISSUES: Vascular calcification present. IMPRESSION: 1. No acute fracture or dislocation. Electronically signed by: Morgane Naveau MD 08/30/2024 09:01 PM EST RP Workstation: [id number]    {Document cardiac monitor, telemetry assessment procedure when appropriate:32947} Procedures   Medications Ordered in the ED  acetaminophen  (TYLENOL ) tablet 650 mg (650 mg Oral Given 08/30/24 1911)  ibuprofen  (ADVIL ) tablet 800 mg (800 mg Oral Given 08/30/24 1911)  fentaNYL  (SUBLIMAZE ) injection 100 mcg (100 mcg Intramuscular Given 08/30/24 2239)  ondansetron  (ZOFRAN -ODT) disintegrating tablet 4 mg (4 mg Oral Given 08/30/24 2238)      {Click here for ABCD2, HEART and other calculators REFRESH Note before signing:1}                              Medical Decision Making Amount and/or Complexity of Data Reviewed Labs:  ordered. Radiology: ordered.  Risk OTC drugs. Prescription drug management. Decision regarding hospitalization.    74 year old female with past medical history of breast cancer, GERD, hypertension, hyperlipidemia presenting for complaints of right hip and right femur pain post fall.  Is alert retensioned in no acute distress, afebrile, stable signs.  Physical Sam demonstrates tenderness to palpation of the right hip, right trochanter, and right knee.  No open wounds, ecchymosis, or swelling on physical exam.  Lower extremity neurovascularly intact.  Otherwise no spinal pain.  GCS 15.  No head trauma or LOC.  X-ray pending.  Motrin  Tylenol  given for pain.  Narcotics offered and declined.   X-ray demonstrates is femoral neck fracture.  CT ordered and pending.  Otherwise no other fractures at this time.  Had pain while being transported during x-ray.  Fentanyl  and Zofran  ordered for CT scan.  Foley catheter placed.  I spoke with on-call orthopedic surgeon Dr. Reyne requested patient be n.p.o. at midnight for surgery tomorrow.  Basic labs, type and screen, ED EKG, and chest x-ray ordered for surgery preparation and cardiac clearance.  Pending admission to hospitalist.   {Document critical care time when appropriate  Document review of labs and clinical decision tools ie CHADS2VASC2, etc  Document your independent review of radiology images and any outside records  Document your discussion with family members, caretakers and with consultants  Document social determinants of health affecting pt's care  Document your decision making why or why not admission, treatments were needed:32947:::1}   Final diagnoses:  Closed fracture of neck of right femur, initial encounter [facility])    ED Discharge Orders     None        "

## 2024-08-31 ENCOUNTER — Inpatient Hospital Stay (HOSPITAL_COMMUNITY)

## 2024-08-31 ENCOUNTER — Encounter (HOSPITAL_COMMUNITY): Payer: Self-pay | Admitting: Internal Medicine

## 2024-08-31 ENCOUNTER — Encounter (HOSPITAL_COMMUNITY): Admission: EM | Disposition: A | Payer: Self-pay | Source: Home / Self Care | Attending: Internal Medicine

## 2024-08-31 DIAGNOSIS — Z79899 Other long term (current) drug therapy: Secondary | ICD-10-CM | POA: Diagnosis not present

## 2024-08-31 DIAGNOSIS — Z7985 Long-term (current) use of injectable non-insulin antidiabetic drugs: Secondary | ICD-10-CM | POA: Diagnosis not present

## 2024-08-31 DIAGNOSIS — Z832 Family history of diseases of the blood and blood-forming organs and certain disorders involving the immune mechanism: Secondary | ICD-10-CM | POA: Diagnosis not present

## 2024-08-31 DIAGNOSIS — Z9011 Acquired absence of right breast and nipple: Secondary | ICD-10-CM | POA: Diagnosis not present

## 2024-08-31 DIAGNOSIS — S72001A Fracture of unspecified part of neck of right femur, initial encounter for closed fracture: Secondary | ICD-10-CM | POA: Diagnosis not present

## 2024-08-31 DIAGNOSIS — K219 Gastro-esophageal reflux disease without esophagitis: Secondary | ICD-10-CM | POA: Diagnosis present

## 2024-08-31 DIAGNOSIS — Z806 Family history of leukemia: Secondary | ICD-10-CM | POA: Diagnosis not present

## 2024-08-31 DIAGNOSIS — Z800 Family history of malignant neoplasm of digestive organs: Secondary | ICD-10-CM | POA: Diagnosis not present

## 2024-08-31 DIAGNOSIS — I10 Essential (primary) hypertension: Secondary | ICD-10-CM | POA: Diagnosis present

## 2024-08-31 DIAGNOSIS — Z853 Personal history of malignant neoplasm of breast: Secondary | ICD-10-CM | POA: Diagnosis not present

## 2024-08-31 DIAGNOSIS — M7989 Other specified soft tissue disorders: Secondary | ICD-10-CM | POA: Diagnosis not present

## 2024-08-31 DIAGNOSIS — D62 Acute posthemorrhagic anemia: Secondary | ICD-10-CM | POA: Diagnosis not present

## 2024-08-31 DIAGNOSIS — Z885 Allergy status to narcotic agent status: Secondary | ICD-10-CM | POA: Diagnosis not present

## 2024-08-31 DIAGNOSIS — Z823 Family history of stroke: Secondary | ICD-10-CM | POA: Diagnosis not present

## 2024-08-31 DIAGNOSIS — Z90710 Acquired absence of both cervix and uterus: Secondary | ICD-10-CM | POA: Diagnosis not present

## 2024-08-31 DIAGNOSIS — S72011A Unspecified intracapsular fracture of right femur, initial encounter for closed fracture: Secondary | ICD-10-CM | POA: Diagnosis present

## 2024-08-31 DIAGNOSIS — Z83719 Family history of colon polyps, unspecified: Secondary | ICD-10-CM | POA: Diagnosis not present

## 2024-08-31 DIAGNOSIS — Z803 Family history of malignant neoplasm of breast: Secondary | ICD-10-CM | POA: Diagnosis not present

## 2024-08-31 DIAGNOSIS — Z8249 Family history of ischemic heart disease and other diseases of the circulatory system: Secondary | ICD-10-CM | POA: Diagnosis not present

## 2024-08-31 DIAGNOSIS — W000XXA Fall on same level due to ice and snow, initial encounter: Secondary | ICD-10-CM | POA: Diagnosis present

## 2024-08-31 DIAGNOSIS — E119 Type 2 diabetes mellitus without complications: Secondary | ICD-10-CM

## 2024-08-31 DIAGNOSIS — Z7984 Long term (current) use of oral hypoglycemic drugs: Secondary | ICD-10-CM | POA: Diagnosis not present

## 2024-08-31 DIAGNOSIS — Z7982 Long term (current) use of aspirin: Secondary | ICD-10-CM | POA: Diagnosis not present

## 2024-08-31 DIAGNOSIS — S72009A Fracture of unspecified part of neck of unspecified femur, initial encounter for closed fracture: Secondary | ICD-10-CM | POA: Diagnosis present

## 2024-08-31 DIAGNOSIS — Z8042 Family history of malignant neoplasm of prostate: Secondary | ICD-10-CM | POA: Diagnosis not present

## 2024-08-31 DIAGNOSIS — E1142 Type 2 diabetes mellitus with diabetic polyneuropathy: Secondary | ICD-10-CM | POA: Diagnosis present

## 2024-08-31 DIAGNOSIS — S61432A Puncture wound without foreign body of left hand, initial encounter: Secondary | ICD-10-CM | POA: Diagnosis present

## 2024-08-31 DIAGNOSIS — D72829 Elevated white blood cell count, unspecified: Secondary | ICD-10-CM | POA: Diagnosis present

## 2024-08-31 DIAGNOSIS — E7800 Pure hypercholesterolemia, unspecified: Secondary | ICD-10-CM | POA: Diagnosis present

## 2024-08-31 DIAGNOSIS — Y92009 Unspecified place in unspecified non-institutional (private) residence as the place of occurrence of the external cause: Secondary | ICD-10-CM | POA: Diagnosis not present

## 2024-08-31 DIAGNOSIS — E785 Hyperlipidemia, unspecified: Secondary | ICD-10-CM

## 2024-08-31 LAB — CBC WITH DIFFERENTIAL/PLATELET
Abs Immature Granulocytes: 0.05 10*3/uL (ref 0.00–0.07)
Basophils Absolute: 0 10*3/uL (ref 0.0–0.1)
Basophils Relative: 0 %
Eosinophils Absolute: 0.2 10*3/uL (ref 0.0–0.5)
Eosinophils Relative: 2 %
HCT: 40.4 % (ref 36.0–46.0)
Hemoglobin: 12.9 g/dL (ref 12.0–15.0)
Immature Granulocytes: 0 %
Lymphocytes Relative: 16 %
Lymphs Abs: 2.2 10*3/uL (ref 0.7–4.0)
MCH: 28.1 pg (ref 26.0–34.0)
MCHC: 31.9 g/dL (ref 30.0–36.0)
MCV: 88 fL (ref 80.0–100.0)
Monocytes Absolute: 1.2 10*3/uL — ABNORMAL HIGH (ref 0.1–1.0)
Monocytes Relative: 8 %
Neutro Abs: 10.3 10*3/uL — ABNORMAL HIGH (ref 1.7–7.7)
Neutrophils Relative %: 74 %
Platelets: 253 10*3/uL (ref 150–400)
RBC: 4.59 MIL/uL (ref 3.87–5.11)
RDW: 13.5 % (ref 11.5–15.5)
Smear Review: NORMAL
WBC: 14 10*3/uL — ABNORMAL HIGH (ref 4.0–10.5)
nRBC: 0 % (ref 0.0–0.2)

## 2024-08-31 LAB — CBC
HCT: 33.4 % — ABNORMAL LOW (ref 36.0–46.0)
Hemoglobin: 10.8 g/dL — ABNORMAL LOW (ref 12.0–15.0)
MCH: 28.5 pg (ref 26.0–34.0)
MCHC: 32.3 g/dL (ref 30.0–36.0)
MCV: 88.1 fL (ref 80.0–100.0)
Platelets: 213 10*3/uL (ref 150–400)
RBC: 3.79 MIL/uL — ABNORMAL LOW (ref 3.87–5.11)
RDW: 13.6 % (ref 11.5–15.5)
WBC: 10.3 10*3/uL (ref 4.0–10.5)
nRBC: 0 % (ref 0.0–0.2)

## 2024-08-31 LAB — BASIC METABOLIC PANEL WITH GFR
Anion gap: 11 (ref 5–15)
BUN: 16 mg/dL (ref 8–23)
CO2: 25 mmol/L (ref 22–32)
Calcium: 9.8 mg/dL (ref 8.9–10.3)
Chloride: 102 mmol/L (ref 98–111)
Creatinine, Ser: 0.59 mg/dL (ref 0.44–1.00)
GFR, Estimated: >60 mL/min
Glucose, Bld: 122 mg/dL — ABNORMAL HIGH (ref 70–99)
Potassium: 4.1 mmol/L (ref 3.5–5.1)
Sodium: 138 mmol/L (ref 135–145)

## 2024-08-31 LAB — HEMOGLOBIN A1C
Hgb A1c MFr Bld: 6.6 % — ABNORMAL HIGH (ref 4.8–5.6)
Mean Plasma Glucose: 142.72 mg/dL

## 2024-08-31 LAB — TYPE AND SCREEN
ABO/RH(D): B POS
Antibody Screen: NEGATIVE

## 2024-08-31 LAB — PRO BRAIN NATRIURETIC PEPTIDE: Pro Brain Natriuretic Peptide: 81.1 pg/mL

## 2024-08-31 LAB — CBG MONITORING, ED
Glucose-Capillary: 120 mg/dL — ABNORMAL HIGH (ref 70–99)
Glucose-Capillary: 119 mg/dL — ABNORMAL HIGH (ref 70–99)

## 2024-08-31 LAB — GLUCOSE, CAPILLARY
Glucose-Capillary: 124 mg/dL — ABNORMAL HIGH (ref 70–99)
Glucose-Capillary: 199 mg/dL — ABNORMAL HIGH (ref 70–99)
Glucose-Capillary: 227 mg/dL — ABNORMAL HIGH (ref 70–99)
Glucose-Capillary: 234 mg/dL — ABNORMAL HIGH (ref 70–99)

## 2024-08-31 MED ORDER — PROPOFOL 1000 MG/100ML IV EMUL
INTRAVENOUS | Status: AC
  Filled 2024-08-31: qty 100

## 2024-08-31 MED ORDER — ORAL CARE MOUTH RINSE: 15.0000 mL | Freq: Once | OROMUCOSAL | Status: AC

## 2024-08-31 MED ORDER — SENNA 8.6 MG PO TABS
1.0000 | ORAL_TABLET | Freq: Two times a day (BID) | ORAL | Status: DC
  Administered 2024-08-31 – 2024-09-03 (×6): 8.6 mg via ORAL
  Filled 2024-08-31 (×6): qty 1

## 2024-08-31 MED ORDER — MIDAZOLAM HCL (PF) 2 MG/2ML IJ SOLN
INTRAMUSCULAR | Status: DC | PRN
  Administered 2024-08-31: 1 mg via INTRAVENOUS

## 2024-08-31 MED ORDER — FENTANYL CITRATE (PF) 100 MCG/2ML IJ SOLN
INTRAMUSCULAR | Status: DC | PRN
  Administered 2024-08-31 (×5): 50 ug via INTRAVENOUS

## 2024-08-31 MED ORDER — HYDROMORPHONE HCL 1 MG/ML IJ SOLN
INTRAMUSCULAR | Status: DC | PRN
  Administered 2024-08-31 (×2): .5 mg via INTRAVENOUS

## 2024-08-31 MED ORDER — BISACODYL 10 MG RE SUPP: 10.0000 mg | Freq: Every day | RECTAL | Status: DC | PRN

## 2024-08-31 MED ORDER — ACETAMINOPHEN 10 MG/ML IV SOLN
INTRAVENOUS | Status: AC
  Filled 2024-08-31: qty 100

## 2024-08-31 MED ORDER — FENTANYL CITRATE (PF) 50 MCG/ML IJ SOSY: 25.0000 ug | PREFILLED_SYRINGE | INTRAMUSCULAR | Status: DC | PRN

## 2024-08-31 MED ORDER — ONDANSETRON HCL 4 MG/2ML IJ SOLN
INTRAMUSCULAR | Status: AC
  Filled 2024-08-31: qty 2

## 2024-08-31 MED ORDER — HYDROMORPHONE HCL 2 MG/ML IJ SOLN
INTRAMUSCULAR | Status: AC
  Filled 2024-08-31: qty 1

## 2024-08-31 MED ORDER — POVIDONE-IODINE 10 % EX SWAB: 2.0000 | Freq: Once | CUTANEOUS | Status: DC

## 2024-08-31 MED ORDER — DROPERIDOL 2.5 MG/ML IJ SOLN: 0.6250 mg | Freq: Once | INTRAMUSCULAR | Status: DC | PRN

## 2024-08-31 MED ORDER — ROCURONIUM BROMIDE 10 MG/ML (PF) SYRINGE
PREFILLED_SYRINGE | INTRAVENOUS | Status: AC
  Filled 2024-08-31: qty 10

## 2024-08-31 MED ORDER — SODIUM CHLORIDE 0.9 % IR SOLN
Status: DC | PRN
  Administered 2024-08-31: 3000 mL
  Administered 2024-08-31: 1000 mL

## 2024-08-31 MED ORDER — INSULIN ASPART 100 UNIT/ML IJ SOLN
0.0000 [IU] | INTRAMUSCULAR | Status: DC | PRN
  Administered 2024-08-31: 2 [IU] via SUBCUTANEOUS

## 2024-08-31 MED ORDER — PROPOFOL 10 MG/ML IV BOLUS
INTRAVENOUS | Status: DC | PRN
  Administered 2024-08-31: 140 mg via INTRAVENOUS
  Administered 2024-08-31: 50 ug/kg/min via INTRAVENOUS

## 2024-08-31 MED ORDER — ISOPROPYL ALCOHOL 70 % SOLN
Status: DC | PRN
  Administered 2024-08-31: 1 via TOPICAL

## 2024-08-31 MED ORDER — ONDANSETRON HCL 4 MG/2ML IJ SOLN: 4.0000 mg | Freq: Four times a day (QID) | INTRAMUSCULAR | Status: DC | PRN

## 2024-08-31 MED ORDER — SUGAMMADEX SODIUM 200 MG/2ML IV SOLN
INTRAVENOUS | Status: DC | PRN
  Administered 2024-08-31: 300 mg via INTRAVENOUS

## 2024-08-31 MED ORDER — HYDROCODONE-ACETAMINOPHEN 5-325 MG PO TABS
1.0000 | ORAL_TABLET | ORAL | Status: DC | PRN
  Administered 2024-08-31 – 2024-09-03 (×8): 1 via ORAL
  Filled 2024-08-31 (×9): qty 1

## 2024-08-31 MED ORDER — BUPIVACAINE-EPINEPHRINE (PF) 0.25% -1:200000 IJ SOLN
INTRAMUSCULAR | Status: AC
  Filled 2024-08-31: qty 30

## 2024-08-31 MED ORDER — ONDANSETRON HCL 4 MG/2ML IJ SOLN
INTRAMUSCULAR | Status: DC | PRN
  Administered 2024-08-31: 4 mg via INTRAVENOUS

## 2024-08-31 MED ORDER — DIPHENHYDRAMINE HCL 12.5 MG/5ML PO ELIX: 12.5000 mg | ORAL_SOLUTION | ORAL | Status: DC | PRN

## 2024-08-31 MED ORDER — POLYETHYLENE GLYCOL 3350 17 G PO PACK: 17.0000 g | PACK | Freq: Every day | ORAL | Status: DC | PRN

## 2024-08-31 MED ORDER — ALBUMIN HUMAN 5 % IV SOLN: INTRAVENOUS | Status: DC | PRN

## 2024-08-31 MED ORDER — HYDRALAZINE HCL 20 MG/ML IJ SOLN: 5.0000 mg | Freq: Four times a day (QID) | INTRAMUSCULAR | Status: DC | PRN

## 2024-08-31 MED ORDER — WATER FOR IRRIGATION, STERILE IR SOLN
Status: DC | PRN
  Administered 2024-08-31: 2000 mL

## 2024-08-31 MED ORDER — SUGAMMADEX SODIUM 200 MG/2ML IV SOLN
INTRAVENOUS | Status: AC
  Filled 2024-08-31: qty 2

## 2024-08-31 MED ORDER — DEXMEDETOMIDINE HCL IN NACL 80 MCG/20ML IV SOLN
INTRAVENOUS | Status: AC
  Filled 2024-08-31: qty 20

## 2024-08-31 MED ORDER — CEFAZOLIN SODIUM-DEXTROSE 2-4 GM/100ML-% IV SOLN
2.0000 g | Freq: Four times a day (QID) | INTRAVENOUS | Status: AC
  Administered 2024-08-31 (×2): 2 g via INTRAVENOUS
  Filled 2024-08-31 (×3): qty 100

## 2024-08-31 MED ORDER — MORPHINE SULFATE (PF) 2 MG/ML IV SOLN: 1.0000 mg | INTRAVENOUS | Status: DC | PRN

## 2024-08-31 MED ORDER — PHENYLEPHRINE 80 MCG/ML (10ML) SYRINGE FOR IV PUSH (FOR BLOOD PRESSURE SUPPORT)
PREFILLED_SYRINGE | INTRAVENOUS | Status: DC | PRN
  Administered 2024-08-31: 80 ug via INTRAVENOUS
  Administered 2024-08-31: 100 ug via INTRAVENOUS
  Administered 2024-08-31: 80 ug via INTRAVENOUS

## 2024-08-31 MED ORDER — METOCLOPRAMIDE HCL 5 MG PO TABS: 5.0000 mg | ORAL_TABLET | Freq: Three times a day (TID) | ORAL | Status: DC | PRN

## 2024-08-31 MED ORDER — PHENYLEPHRINE 80 MCG/ML (10ML) SYRINGE FOR IV PUSH (FOR BLOOD PRESSURE SUPPORT)
PREFILLED_SYRINGE | INTRAVENOUS | Status: AC
  Filled 2024-08-31: qty 10

## 2024-08-31 MED ORDER — CEFAZOLIN SODIUM-DEXTROSE 2-4 GM/100ML-% IV SOLN
2.0000 g | INTRAVENOUS | Status: AC
  Administered 2024-08-31: 2 g via INTRAVENOUS
  Filled 2024-08-31: qty 100

## 2024-08-31 MED ORDER — DEXMEDETOMIDINE HCL IN NACL 80 MCG/20ML IV SOLN
INTRAVENOUS | Status: DC | PRN
  Administered 2024-08-31 (×3): 8 ug via INTRAVENOUS

## 2024-08-31 MED ORDER — ASPIRIN 81 MG PO CHEW
81.0000 mg | CHEWABLE_TABLET | Freq: Two times a day (BID) | ORAL | Status: DC
  Administered 2024-08-31 – 2024-09-03 (×6): 81 mg via ORAL
  Filled 2024-08-31 (×6): qty 1

## 2024-08-31 MED ORDER — PROPOFOL 10 MG/ML IV BOLUS
INTRAVENOUS | Status: AC
  Filled 2024-08-31: qty 20

## 2024-08-31 MED ORDER — DEXAMETHASONE SODIUM PHOSPHATE 4 MG/ML IJ SOLN
INTRAMUSCULAR | Status: DC | PRN
  Administered 2024-08-31: 5 mg via INTRAVENOUS

## 2024-08-31 MED ORDER — LACTATED RINGERS IV SOLN: INTRAVENOUS | Status: DC

## 2024-08-31 MED ORDER — KETOROLAC TROMETHAMINE 30 MG/ML IJ SOLN
INTRAMUSCULAR | Status: AC
  Filled 2024-08-31: qty 1

## 2024-08-31 MED ORDER — MIDAZOLAM HCL 2 MG/2ML IJ SOLN
INTRAMUSCULAR | Status: AC
  Filled 2024-08-31: qty 2

## 2024-08-31 MED ORDER — ACETAMINOPHEN 325 MG PO TABS: 325.0000 mg | ORAL_TABLET | Freq: Four times a day (QID) | ORAL | Status: DC | PRN

## 2024-08-31 MED ORDER — DEXAMETHASONE SOD PHOSPHATE PF 10 MG/ML IJ SOLN
INTRAMUSCULAR | Status: AC
  Filled 2024-08-31: qty 1

## 2024-08-31 MED ORDER — ROCURONIUM BROMIDE 100 MG/10ML IV SOLN
INTRAVENOUS | Status: DC | PRN
  Administered 2024-08-31: 50 mg via INTRAVENOUS

## 2024-08-31 MED ORDER — LIDOCAINE HCL (PF) 2 % IJ SOLN
INTRAMUSCULAR | Status: AC
  Filled 2024-08-31: qty 5

## 2024-08-31 MED ORDER — FENTANYL CITRATE (PF) 250 MCG/5ML IJ SOLN
INTRAMUSCULAR | Status: AC
  Filled 2024-08-31: qty 5

## 2024-08-31 MED ORDER — METHOCARBAMOL 500 MG PO TABS
500.0000 mg | ORAL_TABLET | Freq: Four times a day (QID) | ORAL | Status: DC | PRN
  Administered 2024-08-31 – 2024-09-03 (×3): 500 mg via ORAL
  Filled 2024-08-31 (×3): qty 1

## 2024-08-31 MED ORDER — CHLORHEXIDINE GLUCONATE 0.12 % MT SOLN
15.0000 mL | Freq: Once | OROMUCOSAL | Status: AC
  Administered 2024-08-31: 15 mL via OROMUCOSAL

## 2024-08-31 MED ORDER — METOCLOPRAMIDE HCL 5 MG/ML IJ SOLN: 5.0000 mg | Freq: Three times a day (TID) | INTRAMUSCULAR | Status: DC | PRN

## 2024-08-31 MED ORDER — MORPHINE SULFATE (PF) 2 MG/ML IV SOLN: 0.5000 mg | INTRAVENOUS | Status: DC | PRN

## 2024-08-31 MED ORDER — ACETAMINOPHEN 10 MG/ML IV SOLN
INTRAVENOUS | Status: DC | PRN
  Administered 2024-08-31: 1000 mg via INTRAVENOUS

## 2024-08-31 MED ORDER — INSULIN ASPART 100 UNIT/ML IJ SOLN
0.0000 [IU] | INTRAMUSCULAR | Status: DC
  Administered 2024-08-31 (×2): 3 [IU] via SUBCUTANEOUS
  Administered 2024-09-01: 2 [IU] via SUBCUTANEOUS
  Filled 2024-08-31: qty 2
  Filled 2024-08-31 (×2): qty 3

## 2024-08-31 MED ORDER — SODIUM CHLORIDE (PF) 0.9 % IJ SOLN
INTRAMUSCULAR | Status: AC
  Filled 2024-08-31: qty 30

## 2024-08-31 MED ORDER — TRANEXAMIC ACID-NACL 1000-0.7 MG/100ML-% IV SOLN
1000.0000 mg | INTRAVENOUS | Status: AC
  Administered 2024-08-31: 1000 mg via INTRAVENOUS
  Filled 2024-08-31: qty 100

## 2024-08-31 MED ORDER — NALOXONE HCL 0.4 MG/ML IJ SOLN: 0.4000 mg | INTRAMUSCULAR | Status: DC | PRN

## 2024-08-31 MED ORDER — KETOROLAC TROMETHAMINE 15 MG/ML IJ SOLN
7.5000 mg | Freq: Four times a day (QID) | INTRAMUSCULAR | Status: AC
  Administered 2024-08-31 – 2024-09-01 (×4): 7.5 mg via INTRAVENOUS
  Filled 2024-08-31 (×4): qty 1

## 2024-08-31 MED ORDER — CHLORHEXIDINE GLUCONATE 4 % EX SOLN: 60.0000 mL | Freq: Once | CUTANEOUS | Status: DC

## 2024-08-31 MED ORDER — SODIUM CHLORIDE (PF) 0.9 % IJ SOLN
INTRAMUSCULAR | Status: DC | PRN
  Administered 2024-08-31: 61 mL

## 2024-08-31 MED ORDER — HYDROMORPHONE HCL 1 MG/ML IJ SOLN: 0.5000 mg | INTRAMUSCULAR | Status: DC | PRN

## 2024-08-31 MED ORDER — HYDROCODONE-ACETAMINOPHEN 7.5-325 MG PO TABS: 1.0000 | ORAL_TABLET | ORAL | Status: DC | PRN

## 2024-08-31 MED ORDER — LIDOCAINE HCL (CARDIAC) PF 100 MG/5ML IV SOSY
PREFILLED_SYRINGE | INTRAVENOUS | Status: DC | PRN
  Administered 2024-08-31: 60 mg via INTRAVENOUS

## 2024-08-31 MED ORDER — 0.9 % SODIUM CHLORIDE (POUR BTL) OPTIME
TOPICAL | Status: DC | PRN
  Administered 2024-08-31: 1000 mL

## 2024-08-31 MED ORDER — SODIUM CHLORIDE 0.9 % IV SOLN: INTRAVENOUS | Status: DC

## 2024-08-31 MED ORDER — METHOCARBAMOL 1000 MG/10ML IJ SOLN: 500.0000 mg | Freq: Four times a day (QID) | INTRAMUSCULAR | Status: DC | PRN

## 2024-08-31 NOTE — H&P (View-Only) (Signed)
 Orthopedic Consult  Patient ID: Casey Olson MRN: [id number] DOB/AGE: 74-Oct-1952 35 y.o.  Reason for Consult:Right hip fracture Referring Physician: Costin  HPI: Casey Olson is an 74 y.o. female who tripped and fell yesterday.  This a mechanical trip and fall.  She denies any antecedent pain to her hip.  Denies any of the injuries or result of her fall.  Denies any blows to the head.  She does have a history of breast cancer but this is in remission  Past Medical History:  Diagnosis Date   Breast cancer (HCC) 07/24/16 bx   right breast   Bronchitis    Cancer (HCC)    right breast   Diabetes mellitus without complication (HCC)    type 2   Family history of breast cancer    Family history of pancreatic cancer    Family history of prostate cancer    GERD (gastroesophageal reflux disease)    High cholesterol    Hypertension     Past Surgical History:  Procedure Laterality Date   ABDOMINAL HYSTERECTOMY     ABDOMINAL HYSTERECTOMY     COLONOSCOPY     MASTECTOMY Right 2018   MASTECTOMY W/ SENTINEL NODE BIOPSY Right 08/26/2016   Procedure: RIGHT MASTECTOMY WITH SENTINEL LYMPH NODE BIOPSY;  Surgeon: Deward Null III, MD;  Location: [facility] OR;  Service: General;  Laterality: Right;   PORT-A-CATH REMOVAL Left 09/08/2017   Procedure: REMOVAL PORT-A-CATH;  Surgeon: Null Deward MOULD, MD;  Location: [facility];  Service: General;  Laterality: Left;   PORTACATH PLACEMENT Left 08/26/2016   Procedure: INSERTION PORT-A-CATH;  Surgeon: Deward Null III, MD;  Location: [facility] OR;  Service: General;  Laterality: Left;   TUBAL LIGATION     WISDOM TOOTH EXTRACTION      Family History  Problem Relation Age of Onset   Breast cancer Cousin        mat first cousin; dx in her late 63s to early 97s   Stroke Mother    Prostate cancer Father    Pancreatic cancer Sister 56   Sickle cell trait Brother    Prostate cancer Maternal Uncle    Breast cancer Paternal Aunt        dx over 53    Leukemia Maternal Grandmother    Heart attack Paternal Grandmother    Heart attack Paternal Grandfather    Sickle cell trait Sister    Sickle cell anemia Other    Colon polyps Sister    Breast cancer Cousin        paternal first cousin died in her 59s   Breast cancer Cousin        pat first cousin died in her 30s   Breast cancer Cousin        pat first cousin dx over 34    Social History:  reports that she has never smoked. She has never used smokeless tobacco. She reports that she does not drink alcohol  and does not use drugs.  Allergies: Allergies[1]  Medications: I have reviewed the patient's current medications.     Exam: Blood pressure (!) 149/74, pulse 84, temperature 99.1 F (37.3 C), temperature source Oral, resp. rate (!) 23, height 5' 3 (1.6 m), weight 73.5 kg, SpO2 95%. General: Well-appearing woman no acute distress Orientation: Alert and oriented Mood and Affect: Mood is calm   Injured Extremity (CV, lymph, sensation, reflexes): Right leg is shortened and rotated.  Tender at the hip.  No tense at the knee  or ankle.  Brisk Apley refill.  Intact sensation in the saphenous, sural, tibial, and peroneal nerve distributions.  4/5 strength in the EHL and FHL  No tenderness of patient crepitus defects or deformities about the bilateral upper extremities or left lower extremity   Medical Decision Making: Data: Imaging: right femoral neck change  Labs: WBC, 10.3, H/H 10.8/33.4  Imaging or Labs ordered:  Medical history and chart was reviewed and case discussed with medical provider.  Assessment/Plan: Right femoral neck fracture  Patient does have a right femoral neck fracture.  This will require surgery.  We discussed the goals of surgery provide better mobilization prevent the morbidity associate with prolonged immobilization including bedsores, pneumonias, this and blood clots.  We discussed that surgery will involve a total versus Hemi hip arthroplasty.  We  discussed the risks of surgery include but are not limited to bleeding, infection, injury to the nerves, leg limb discrepancy, hip dislocation, risk of anesthesia, and the risk of blood clots.  Informed consent is obtained.  Will plan for surgery done later today likely by Dr. Fidel.  New problem w/ workup planned: High complexity diagnosis (Level 5) Surgery w/ risks or Emergency surgery: High complexity Risk (Level 5)  All others are Level 4 with comprehensive musculoskeletal exam.  Casey Rhein, MD, MS [facility] [place] [phone]      [1]  Allergies Allergen Reactions   Percocet [Oxycodone -Acetaminophen ] Nausea And Vomiting

## 2024-08-31 NOTE — Interval H&P Note (Signed)
 History and Physical Interval Note:  08/31/2024 10:00 AM  Casey Olson  has presented today for surgery, with the diagnosis of RIght Hip Fracture.  The various methods of treatment have been discussed with the patient and family. After consideration of risks, benefits and other options for treatment, the patient has consented to  Procedures: ARTHROPLASTY, HIP, TOTAL, ANTERIOR APPROACH (Right) as a surgical intervention.  The patient's history has been reviewed, patient examined, no change in status, stable for surgery.  I have reviewed the patient's chart and labs.  Questions were answered to the patient's satisfaction.     [name] Ruby Logiudice

## 2024-08-31 NOTE — H&P (Signed)
 " History and Physical    Casey Olson FMW:[id number] DOB: 10/02/1950 DOA: 08/30/2024  PCP: Shelda Atlas, MD  Patient coming from: Home  Chief Complaint: Fall, right hip pain  HPI: Casey Olson is a 74 y.o. female with medical history significant of breast cancer in remission, type 2 diabetes with peripheral neuropathy, hypertension, hyperlipidemia, GERD presenting to the ED via EMS from home complaining of acute right hip pain after a mechanical fall.  Patient states she was walking to her mailbox when she slipped and fell on ice.  She was not able to get up due to severe pain in her right hip and her daughter had to call EMS.  Denies head injury or loss of consciousness.  She takes aspirin  81 mg daily and no other blood thinners.  Reports mild intermittent cough related to seasonal/environmental allergies but no change from baseline.  Denies fevers, shortness of breath, nausea, vomiting, abdominal pain, or diarrhea.  ED Course: Vital signs stable on arrival.  Labs notable for WBC count 14.0, glucose 122.  CT of right hip showing transverse comminuted fracture of the right femoral neck with mild superior subluxation of the distal fracture fragment resulting in varus angulation without intertrochanteric involvement or hip dislocation.  X-ray of right knee showing no acute fracture or dislocation.  Chest x-ray showing poor inspiratory effort, mild central vascular congestion, and no acute findings.  EKG showing normal sinus rhythm and no acute ischemic changes.  Orthopedics consulted (Dr. Reyne) who requested patient to be n.p.o. after midnight for surgery in the morning.  Patient was given Tylenol , fentanyl , ibuprofen , and Zofran .  Review of Systems:  Review of Systems  All other systems reviewed and are negative.   Past Medical History:  Diagnosis Date   Breast cancer (HCC) 07/24/16 bx   right breast   Bronchitis    Cancer (HCC)    right breast   Diabetes mellitus without  complication (HCC)    type 2   Family history of breast cancer    Family history of pancreatic cancer    Family history of prostate cancer    GERD (gastroesophageal reflux disease)    High cholesterol    Hypertension     Past Surgical History:  Procedure Laterality Date   ABDOMINAL HYSTERECTOMY     ABDOMINAL HYSTERECTOMY     COLONOSCOPY     MASTECTOMY Right 2018   MASTECTOMY W/ SENTINEL NODE BIOPSY Right 08/26/2016   Procedure: RIGHT MASTECTOMY WITH SENTINEL LYMPH NODE BIOPSY;  Surgeon: Deward Null III, MD;  Location: [facility] OR;  Service: General;  Laterality: Right;   PORT-A-CATH REMOVAL Left 09/08/2017   Procedure: REMOVAL PORT-A-CATH;  Surgeon: Null Deward MOULD, MD;  Location: [facility];  Service: General;  Laterality: Left;   PORTACATH PLACEMENT Left 08/26/2016   Procedure: INSERTION PORT-A-CATH;  Surgeon: Deward Null III, MD;  Location: [facility] OR;  Service: General;  Laterality: Left;   TUBAL LIGATION     WISDOM TOOTH EXTRACTION       reports that she has never smoked. She has never used smokeless tobacco. She reports that she does not drink alcohol  and does not use drugs.  Allergies[1]  Family History  Problem Relation Age of Onset   Breast cancer Cousin        mat first cousin; dx in her late 25s to early 25s   Stroke Mother    Prostate cancer Father    Pancreatic cancer Sister 20   Sickle cell trait Brother  Prostate cancer Maternal Uncle    Breast cancer Paternal Aunt        dx over 69   Leukemia Maternal Grandmother    Heart attack Paternal Grandmother    Heart attack Paternal Grandfather    Sickle cell trait Sister    Sickle cell anemia Other    Colon polyps Sister    Breast cancer Cousin        paternal first cousin died in her 54s   Breast cancer Cousin        pat first cousin died in her 81s   Breast cancer Cousin        pat first cousin dx over 48    Prior to Admission medications  Medication Sig Start Date End Date Taking? Authorizing Provider   acetaminophen  (TYLENOL ) 325 MG tablet Take 650 mg by mouth every 6 (six) hours as needed for mild pain.   Yes [provider]  aspirin  EC 81 MG tablet Take 81 mg by mouth daily.   Yes [provider]  Calcium Carbonate-Vitamin D (CALTRATE 600+D PO) Take 1 tablet by mouth 2 (two) times daily.   Yes [provider]  CEQUA 0.09 % SOLN Apply 1 drop to eye 2 (two) times daily. 07/03/24  Yes [provider]  cetirizine (ZYRTEC) 10 MG tablet Take 10 mg by mouth at bedtime as needed. 05/16/24  Yes [provider]  gabapentin  (NEURONTIN ) 300 MG capsule Take 300 mg by mouth 3 (three) times daily.   Yes [provider]  glipiZIDE  (GLUCOTROL ) 5 MG tablet Take 1 tablet (5 mg total) by mouth daily before breakfast. 03/06/21  Yes Odean Potts, MD  losartan  (COZAAR ) 100 MG tablet Take 100 mg by mouth daily. 11/13/16  Yes [provider]  metFORMIN  (GLUCOPHAGE ) 1000 MG tablet Take 1,000 mg by mouth 2 (two) times daily. 07/13/16  Yes [provider]  Multiple Vitamin (MULTIVITAMIN WITH MINERALS) TABS tablet Take 1 tablet by mouth daily.   Yes [provider]  OZEMPIC, 2 MG/DOSE, 8 MG/3ML SOPN Inject 2 mg into the skin once a week. 08/24/24  Yes [provider]  simvastatin  (ZOCOR ) 10 MG tablet Take 10 mg by mouth daily at 6 PM.  07/08/16  Yes [provider]  Blood Glucose Monitoring Suppl (ONETOUCH VERIO FLEX SYSTEM) w/Device KIT USE TO TEST 3 TIMES DAILY DX E11.65 06/12/16   [provider]  lidocaine  (LIDODERM ) 5 % Place 1 patch onto the skin daily. Remove & Discard patch within 12 hours or as directed by MD Patient not taking: Reported on 08/31/2024 04/11/22   Olson, Casey L, PA  ONETOUCH DELICA LANCETS FINE MISC USE TO TEST 3 TIMES DAILY DX E11.65 06/12/16   [provider]  [facility] VERIO test strip USE TO TEST 3 TIMES DAILY DX E11.65 06/12/16   [provider]    Physical Exam: Vitals:    08/30/24 2340 08/31/24 0015 08/31/24 0115 08/31/24 0200  BP: (!) 145/74 137/65 (!) 144/62 136/69  Pulse: 79 73 87 84  Resp: 19 19 (!) 25 (!) 21  Temp:    98.5 F (36.9 C)  TempSrc:    Oral  SpO2: 96% 95% 94% (!) 89%  Weight:      Height:        Physical Exam Vitals reviewed.  Constitutional:      General: She is not in acute distress. HENT:     Head: Normocephalic and atraumatic.  Eyes:     Extraocular  Movements: Extraocular movements intact.  Cardiovascular:     Rate and Rhythm: Normal rate and regular rhythm.     Heart sounds: Normal heart sounds.  Pulmonary:     Effort: Pulmonary effort is normal. No respiratory distress.     Breath sounds: Normal breath sounds.  Abdominal:     General: Bowel sounds are normal.     Palpations: Abdomen is soft.     Tenderness: There is no abdominal tenderness. There is no guarding.  Musculoskeletal:     Cervical back: Normal range of motion.     Right lower leg: No edema.     Left lower leg: No edema.     Comments: Right lower extremity neurovascularly intact  Skin:    General: Skin is warm and dry.  Neurological:     General: No focal deficit present.     Mental Status: She is alert and oriented to person, place, and time.     Labs on Admission: I have personally reviewed following labs and imaging studies  CBC: Recent Labs  Lab 08/31/24 0015  WBC 14.0*  NEUTROABS 10.3*  HGB 12.9  HCT 40.4  MCV 88.0  PLT 253   Basic Metabolic Panel: Recent Labs  Lab 08/31/24 0015  NA 138  K 4.1  CL 102  CO2 25  GLUCOSE 122*  BUN 16  CREATININE 0.59  CALCIUM 9.8   GFR: Estimated Creatinine Clearance: 60.1 mL/min (by C-G formula based on SCr of 0.59 mg/dL). Liver Function Tests: No results for input(s): AST, ALT, ALKPHOS, BILITOT, PROT, ALBUMIN  in the last 168 hours. No results for input(s): LIPASE, AMYLASE in the last 168 hours. No results for input(s): AMMONIA in the last 168 hours. Coagulation  Profile: No results for input(s): INR, PROTIME in the last 168 hours. Cardiac Enzymes: No results for input(s): CKTOTAL, CKMB, CKMBINDEX, TROPONINI in the last 168 hours. BNP (last 3 results) No results for input(s): PROBNP in the last 8760 hours. HbA1C: No results for input(s): HGBA1C in the last 72 hours. CBG: No results for input(s): GLUCAP in the last 168 hours. Lipid Profile: No results for input(s): CHOL, HDL, LDLCALC, TRIG, CHOLHDL, LDLDIRECT in the last 72 hours. Thyroid Function Tests: No results for input(s): TSH, T4TOTAL, FREET4, T3FREE, THYROIDAB in the last 72 hours. Anemia Panel: No results for input(s): VITAMINB12, FOLATE, FERRITIN, TIBC, IRON, RETICCTPCT in the last 72 hours. Urine analysis: No results found for: COLORURINE, APPEARANCEUR, LABSPEC, PHURINE, GLUCOSEU, HGBUR, BILIRUBINUR, KETONESUR, PROTEINUR, UROBILINOGEN, NITRITE, LEUKOCYTESUR  Radiological Exams on Admission: See HPI.  Assessment and Plan  Right femoral neck fracture Orthopedics consulted.  Keep n.p.o. and continue pain management.  Nonweightbearing.  Hold home aspirin .  She is not on anticoagulation.  Preop EKG showing normal sinus rhythm and no acute ischemic changes.  Patient is not endorsing chest pain.  Preop chest x-ray showing poor inspiratory effort, mild central vascular congestion, and no acute findings.  Last echo done in May 2018 showing preserved EF.  Check BNP.  Mild leukocytosis Likely reactive.  No infectious signs or symptoms.  Repeat CBC ordered.  Type 2 diabetes Last hemoglobin A1c 8.3 in January 2018, repeat ordered.  Placed on sensitive sliding scale insulin  every 4 hours as patient is currently NPO.  Peripheral neuropathy Hypertension: Currently normotensive. IV hydralazine  PRN SBP >160. Hyperlipidemia GERD Resume home meds when no longer NPO.  DVT prophylaxis: SCDs Code Status: Full Code  (discussed with the patient) Level of care: Telemetry bed Admission status: It is my clinical  opinion that admission to INPATIENT is reasonable and necessary because of the expectation that this patient will require hospital care that crosses at least 2 midnights to treat this condition based on the medical complexity of the problems presented.  Given the aforementioned information, the predictability of an adverse outcome is felt to be significant.  Editha Ram MD [facility]  If 7PM-7AM, please contact night-coverage [email or web address]  08/31/2024, 4:16 AM       [1]  Allergies Allergen Reactions   Percocet [Oxycodone -Acetaminophen ] Nausea And Vomiting   "

## 2024-08-31 NOTE — Anesthesia Preprocedure Evaluation (Addendum)
"                                    Anesthesia Evaluation  Patient identified by MRN, date of birth, ID band Patient awake    Reviewed: Allergy & Precautions, NPO status , Patient's Chart, lab work & pertinent test results  Airway Mallampati: II  TM Distance: >3 FB Neck ROM: Full    Dental  (+) Dental Advisory Given   Pulmonary neg pulmonary ROS   breath sounds clear to auscultation       Cardiovascular hypertension, Pt. on medications  Rhythm:Regular Rate:Normal     Neuro/Psych  Neuromuscular disease    GI/Hepatic Neg liver ROS,GERD  ,,  Endo/Other  diabetes, Type 2, Oral Hypoglycemic Agents    Renal/GU negative Renal ROS     Musculoskeletal   Abdominal   Peds  Hematology  (+) Blood dyscrasia, anemia   Anesthesia Other Findings   Reproductive/Obstetrics                              Anesthesia Physical Anesthesia Plan  ASA: 2  Anesthesia Plan: General   Post-op Pain Management: Ofirmev  IV (intra-op)*   Induction: Intravenous  PONV Risk Score and Plan: 3 and Dexamethasone , Ondansetron  and Treatment may vary due to age or medical condition  Airway Management Planned: Oral ETT  Additional Equipment:   Intra-op Plan:   Post-operative Plan: Extubation in OR  Informed Consent: I have reviewed the patients History and Physical, chart, labs and discussed the procedure including the risks, benefits and alternatives for the proposed anesthesia with the patient or authorized representative who has indicated his/her understanding and acceptance.     Dental advisory given  Plan Discussed with: CRNA  Anesthesia Plan Comments:          Anesthesia Quick Evaluation  "

## 2024-08-31 NOTE — ED Notes (Signed)
 Patient resting in bed watching tv.  No respiratory distress noted. Pt remains on continuous cardiac, pulse ox and bp monitoring.  Requesting bedpan, seeking assistance due to right femoral head fracture.  Pt denies pain when not moving.

## 2024-08-31 NOTE — Plan of Care (Signed)
 " Problem: Education: Goal: Ability to describe self-care measures that may prevent or decrease complications (Diabetes Survival Skills Education) will improve Outcome: Progressing Goal: Individualized Educational Video(s) Outcome: Progressing   Problem: Coping: Goal: Ability to adjust to condition or change in health will improve Outcome: Progressing   Problem: Fluid Volume: Goal: Ability to maintain a balanced intake and output will improve Outcome: Progressing   Problem: Health Behavior/Discharge Planning: Goal: Ability to identify and utilize available resources and services will improve Outcome: Progressing Goal: Ability to manage health-related needs will improve Outcome: Progressing   Problem: Metabolic: Goal: Ability to maintain appropriate glucose levels will improve Outcome: Progressing   Problem: Nutritional: Goal: Maintenance of adequate nutrition will improve Outcome: Progressing Goal: Progress toward achieving an optimal weight will improve Outcome: Progressing   Problem: Skin Integrity: Goal: Risk for impaired skin integrity will decrease Outcome: Progressing   Problem: Tissue Perfusion: Goal: Adequacy of tissue perfusion will improve Outcome: Progressing   Problem: Education: Goal: Knowledge of General Education information will improve Description: Including pain rating scale, medication(s)/side effects and non-pharmacologic comfort measures Outcome: Progressing   Problem: Health Behavior/Discharge Planning: Goal: Ability to manage health-related needs will improve Outcome: Progressing   Problem: Clinical Measurements: Goal: Ability to maintain clinical measurements within normal limits will improve Outcome: Progressing Goal: Will remain free from infection Outcome: Progressing Goal: Diagnostic test results will improve Outcome: Progressing Goal: Respiratory complications will improve Outcome: Progressing Goal: Cardiovascular complication will  be avoided Outcome: Progressing   Problem: Activity: Goal: Risk for activity intolerance will decrease Outcome: Progressing   Problem: Nutrition: Goal: Adequate nutrition will be maintained Outcome: Progressing   Problem: Coping: Goal: Level of anxiety will decrease Outcome: Progressing   Problem: Elimination: Goal: Will not experience complications related to bowel motility Outcome: Progressing Goal: Will not experience complications related to urinary retention Outcome: Progressing   Problem: Pain Managment: Goal: General experience of comfort will improve and/or be controlled Outcome: Progressing   Problem: Safety: Goal: Ability to remain free from injury will improve Outcome: Progressing   Problem: Skin Integrity: Goal: Risk for impaired skin integrity will decrease Outcome: Progressing   Problem: Education: Goal: Knowledge of the prescribed therapeutic regimen will improve Outcome: Progressing   Problem: Bowel/Gastric: Goal: Gastrointestinal status for postoperative course will improve Outcome: Progressing   Problem: Cardiac: Goal: Ability to maintain an adequate cardiac output Outcome: Progressing Goal: Will show no evidence of cardiac arrhythmias Outcome: Progressing   Problem: Nutritional: Goal: Will attain and maintain optimal nutritional status Outcome: Progressing   Problem: Neurological: Goal: Will regain or maintain usual level of consciousness Outcome: Progressing   Problem: Clinical Measurements: Goal: Ability to maintain clinical measurements within normal limits Outcome: Progressing Goal: Postoperative complications will be avoided or minimized Outcome: Progressing   Problem: Respiratory: Goal: Will regain and/or maintain adequate ventilation Outcome: Progressing Goal: Respiratory status will improve Outcome: Progressing   Problem: Skin Integrity: Goal: Demonstrates signs of wound healing without infection Outcome: Progressing    Problem: Urinary Elimination: Goal: Will remain free from infection Outcome: Progressing Goal: Ability to achieve and maintain adequate urine output Outcome: Progressing   Problem: Education: Goal: Knowledge of the prescribed therapeutic regimen will improve Outcome: Progressing Goal: Understanding of discharge needs will improve Outcome: Progressing Goal: Individualized Educational Video(s) Outcome: Progressing   Problem: Activity: Goal: Ability to avoid complications of mobility impairment will improve Outcome: Progressing Goal: Ability to tolerate increased activity will improve Outcome: Progressing   Problem: Clinical Measurements: Goal: Postoperative complications will be avoided or  minimized Outcome: Progressing   "

## 2024-08-31 NOTE — Consult Note (Signed)
 Orthopedic Consult  Patient ID: Casey Olson MRN: [id number] DOB/AGE: 74-Oct-1952 35 y.o.  Reason for Consult:Right hip fracture Referring Physician: Costin  HPI: Casey Olson is an 74 y.o. female who tripped and fell yesterday.  This a mechanical trip and fall.  She denies any antecedent pain to her hip.  Denies any of the injuries or result of her fall.  Denies any blows to the head.  She does have a history of breast cancer but this is in remission  Past Medical History:  Diagnosis Date   Breast cancer (HCC) 07/24/16 bx   right breast   Bronchitis    Cancer (HCC)    right breast   Diabetes mellitus without complication (HCC)    type 2   Family history of breast cancer    Family history of pancreatic cancer    Family history of prostate cancer    GERD (gastroesophageal reflux disease)    High cholesterol    Hypertension     Past Surgical History:  Procedure Laterality Date   ABDOMINAL HYSTERECTOMY     ABDOMINAL HYSTERECTOMY     COLONOSCOPY     MASTECTOMY Right 2018   MASTECTOMY W/ SENTINEL NODE BIOPSY Right 08/26/2016   Procedure: RIGHT MASTECTOMY WITH SENTINEL LYMPH NODE BIOPSY;  Surgeon: Deward Null III, MD;  Location: [facility] OR;  Service: General;  Laterality: Right;   PORT-A-CATH REMOVAL Left 09/08/2017   Procedure: REMOVAL PORT-A-CATH;  Surgeon: Null Deward MOULD, MD;  Location: [facility];  Service: General;  Laterality: Left;   PORTACATH PLACEMENT Left 08/26/2016   Procedure: INSERTION PORT-A-CATH;  Surgeon: Deward Null III, MD;  Location: [facility] OR;  Service: General;  Laterality: Left;   TUBAL LIGATION     WISDOM TOOTH EXTRACTION      Family History  Problem Relation Age of Onset   Breast cancer Cousin        mat first cousin; dx in her late 63s to early 97s   Stroke Mother    Prostate cancer Father    Pancreatic cancer Sister 56   Sickle cell trait Brother    Prostate cancer Maternal Uncle    Breast cancer Paternal Aunt        dx over 53    Leukemia Maternal Grandmother    Heart attack Paternal Grandmother    Heart attack Paternal Grandfather    Sickle cell trait Sister    Sickle cell anemia Other    Colon polyps Sister    Breast cancer Cousin        paternal first cousin died in her 59s   Breast cancer Cousin        pat first cousin died in her 30s   Breast cancer Cousin        pat first cousin dx over 34    Social History:  reports that she has never smoked. She has never used smokeless tobacco. She reports that she does not drink alcohol  and does not use drugs.  Allergies: Allergies[1]  Medications: I have reviewed the patient's current medications.     Exam: Blood pressure (!) 149/74, pulse 84, temperature 99.1 F (37.3 C), temperature source Oral, resp. rate (!) 23, height 5' 3 (1.6 m), weight 73.5 kg, SpO2 95%. General: Well-appearing woman no acute distress Orientation: Alert and oriented Mood and Affect: Mood is calm   Injured Extremity (CV, lymph, sensation, reflexes): Right leg is shortened and rotated.  Tender at the hip.  No tense at the knee  or ankle.  Brisk Apley refill.  Intact sensation in the saphenous, sural, tibial, and peroneal nerve distributions.  4/5 strength in the EHL and FHL  No tenderness of patient crepitus defects or deformities about the bilateral upper extremities or left lower extremity   Medical Decision Making: Data: Imaging: right femoral neck change  Labs: WBC, 10.3, H/H 10.8/33.4  Imaging or Labs ordered:  Medical history and chart was reviewed and case discussed with medical provider.  Assessment/Plan: Right femoral neck fracture  Patient does have a right femoral neck fracture.  This will require surgery.  We discussed the goals of surgery provide better mobilization prevent the morbidity associate with prolonged immobilization including bedsores, pneumonias, this and blood clots.  We discussed that surgery will involve a total versus Hemi hip arthroplasty.  We  discussed the risks of surgery include but are not limited to bleeding, infection, injury to the nerves, leg limb discrepancy, hip dislocation, risk of anesthesia, and the risk of blood clots.  Informed consent is obtained.  Will plan for surgery done later today likely by Dr. Fidel.  New problem w/ workup planned: High complexity diagnosis (Level 5) Surgery w/ risks or Emergency surgery: High complexity Risk (Level 5)  All others are Level 4 with comprehensive musculoskeletal exam.  Casey Rhein, MD, MS [facility] / [place] [phone]      [1]  Allergies Allergen Reactions   Percocet [Oxycodone -Acetaminophen ] Nausea And Vomiting

## 2024-08-31 NOTE — Transfer of Care (Signed)
 Immediate Anesthesia Transfer of Care Note  Patient: Casey Olson  Procedure(s) Performed: RIGHT TOTALHIP ARTHROPLASTY, ANTERIOR APPROACH (Right: Hip)  Patient Location: PACU  Anesthesia Type:General  Level of Consciousness: awake  Airway & Oxygen Therapy: Patient Spontanous Breathing and Patient connected to nasal cannula oxygen  Post-op Assessment: Report given to RN and Post -op Vital signs reviewed and stable  Post vital signs: Reviewed and stable  Last Vitals:  Vitals Value Taken Time  BP 149/73 08/31/24 13:15  Temp    Pulse 82 08/31/24 13:15  Resp 19 08/31/24 13:15  SpO2 96 % 08/31/24 13:15    Last Pain:  Vitals:   08/31/24 1014  TempSrc: Oral  PainSc:          Complications: No notable events documented.

## 2024-08-31 NOTE — ED Notes (Signed)
 Orthopedics at bedside

## 2024-08-31 NOTE — ED Provider Notes (Signed)
 I assumed care of this patient from previous provider.  Please see their note for further details of history, exam, and MDM.   Briefly patient is a 74 y.o. female who presented with right hip pain following a fall.  Workup notable for transverse right femoral neck fracture.  Orthopedic surgery already consulted.  Currently awaiting labs for medical admission  Labs reassuring. Spoke with Dr. Alfornia from hospitalist service who accepted patient for admission      Samuel Rittenhouse, Raynell Moder, MD 08/31/24 [phone]

## 2024-08-31 NOTE — Progress Notes (Signed)
 " PROGRESS NOTE    Casey SCHMIEDER  FMW:[id number] DOB: 1950/12/14 DOA: 08/30/2024 PCP: Shelda Atlas, MD   Chief Complaint  Patient presents with   Fall   Hip Pain    Brief Narrative:  Pt is a 74 y/o female with a past medical history of essential hypertension on losartan , hyperlipidemia, type 2 DM on ozempic, peripheral neuropathy, breast cancer s/p R mastectomy in remission. She was brought to the ED by EMS after slipping on ice on her way to the mailbox yesterday evening. She denies losing consciousness or hitting her head, but is experiencing acute R hip pain. XR and CT revealed a mildly displaced R femoral neck fracture.   Assessment & Plan:   Principal Problem:   Hip fracture (HCC) Active Problems:   Peripheral neuropathy   Type 2 diabetes mellitus (HCC)   Essential hypertension   Hyperlipidemia   R femoral neck fracture -hold home aspirin  -orthopedics consulted, THA scheduled for today -PT assessment post operatively   Normocytic anemia -likely acute due to femoral neck fracture -trend H&H  Essential HTN -hold losartan  -continue hydralazine  post operatively until discharged  Hyperlipidemia -hold simvastatin  until discharged  Type 2 DM -last A1C 8.3 (08/21/16) -continue sliding scale insulin  while inpatient  -continue glipizide , metformin , and ozempic as prescribed -follow up with PCP about blood sugar management and prescriptions  Peripheral neuropathy -continue gabapentin  upon discharge  DVT prophylaxis: None (pre operative) Code Status: Full Family Communication: None at bedside, discussed plan with daughter over the phone, answered her questions and followed up with her concerns Disposition:   Status is: Inpatient Remains inpatient appropriate because: Preoperative   Consultants:  Orthopedics: total R hip arthroplasty  Antimicrobials:  Preoperative cefazolin  2g IV     Subjective: Pt says that she is feeling okay, all things considered. She  is anxious about surgery because her daughters have not arrived to the hospital yet. She says the R hip pain gets worse when she tries to adjust her positioning to get comfortable, but it is well managed with pain medication. She also says that she has passing gas frequently.   Objective: Vitals:   08/31/24 0400 08/31/24 0500 08/31/24 0530 08/31/24 0530  BP: 134/68 138/70 (!) 149/74   Pulse: 83 81 84   Resp: 17 (!) 22 (!) 23   Temp:    99.1 F (37.3 C)  TempSrc:    Oral  SpO2: 91% 93% 95%   Weight:      Height:        Intake/Output Summary (Last 24 hours) at 08/31/2024 0718 Last data filed at 08/31/2024 0203 Gross per 24 hour  Intake --  Output 650 ml  Net -650 ml   Filed Weights   08/30/24 1652  Weight: 73.5 kg    Examination:  General exam: Appears calm and comfortable.  Respiratory system: Clear to auscultation. Respiratory effort normal. Cardiovascular system: S1 & S2 heard, RRR. No JVD, murmurs, rubs, gallops or clicks. No pedal edema. Pedal pulses intact bilaterally.  Gastrointestinal system: Abdomen is nondistended, soft and nontender. No organomegaly or masses felt. Normal bowel sounds heard. Central nervous system: Alert and oriented. No focal neurological deficits. Sensation intact in bilateral lower extremities.  Extremities: Unable to lift R leg. Moving L leg causes pain in R upper thigh due to change in positioning.  Skin: No rashes, lesions or ulcers. Small puncture wound on L hand from falling on ice that is not actively bleeding and shows no sign of infection.  Psychiatry:  Judgement and insight appear normal. Mood & affect appropriate.     Data Reviewed: I have personally reviewed following labs and imaging studies  CBC: Recent Labs  Lab 08/31/24 0015 08/31/24 0525  WBC 14.0* 10.3  NEUTROABS 10.3*  --   HGB 12.9 10.8*  HCT 40.4 33.4*  MCV 88.0 88.1  PLT 253 213    Basic Metabolic Panel: Recent Labs  Lab 08/31/24 0015  NA 138  K 4.1  CL 102   CO2 25  GLUCOSE 122*  BUN 16  CREATININE 0.59  CALCIUM 9.8    GFR: Estimated Creatinine Clearance: 60.1 mL/min (by C-G formula based on SCr of 0.59 mg/dL).  Liver Function Tests: No results for input(s): AST, ALT, ALKPHOS, BILITOT, PROT, ALBUMIN  in the last 168 hours.  CBG: Recent Labs  Lab 08/31/24 0526  GLUCAP 120*     No results found for this or any previous visit (from the past 240 hours).       Scheduled Meds:  insulin  aspart  0-9 Units Subcutaneous Q4H   Continuous Infusions:   LOS: 0 days     Marcelline Greet, PA-S [facility]    To contact the attending provider between 7A-7P or the covering provider during after hours 7P-7A, please log into the web site [email or web address] and access using universal [place] password for that web site. If you do not have the password, please call the hospital operator.  08/31/2024, 7:18 AM   "

## 2024-08-31 NOTE — Discharge Instructions (Addendum)

## 2024-08-31 NOTE — Anesthesia Procedure Notes (Signed)
 Procedure Name: Intubation Date/Time: 08/31/2024 10:38 AM  Performed by: Belvie Valri NOVAK, CRNAPre-anesthesia Checklist: Patient identified, Emergency Drugs available, Suction available and Patient being monitored Patient Re-evaluated:Patient Re-evaluated prior to induction Oxygen Delivery Method: Circle System Utilized Preoxygenation: Pre-oxygenation with 100% oxygen Induction Type: IV induction Ventilation: Mask ventilation without difficulty and Oral airway inserted - appropriate to patient size Laryngoscope Size: 3 and Glidescope Grade View: Grade I Tube type: Oral Number of attempts: 1 Airway Equipment and Method: Stylet and Oral airway Placement Confirmation: ETT inserted through vocal cords under direct vision, positive ETCO2 and breath sounds checked- equal and bilateral Secured at: 21 cm Tube secured with: Tape Dental Injury: Teeth and Oropharynx as per pre-operative assessment

## 2024-08-31 NOTE — Anesthesia Postprocedure Evaluation (Signed)
"   Anesthesia Post Note  Patient: Casey Olson  Procedure(s) Performed: RIGHT TOTALHIP ARTHROPLASTY, ANTERIOR APPROACH (Right: Hip)     Patient location during evaluation: PACU Anesthesia Type: General Level of consciousness: awake and alert Pain management: pain level controlled Vital Signs Assessment: post-procedure vital signs reviewed and stable Respiratory status: spontaneous breathing, nonlabored ventilation, respiratory function stable and patient connected to nasal cannula oxygen Cardiovascular status: blood pressure returned to baseline and stable Postop Assessment: no apparent nausea or vomiting Anesthetic complications: no   No notable events documented.  Last Vitals:  Vitals:   08/31/24 1400 08/31/24 1429  BP: (!) 145/71 132/77  Pulse: 76 79  Resp: 14 14  Temp:  36.4 C  SpO2: 99% 95%    Last Pain:  Vitals:   08/31/24 1429  TempSrc: Oral  PainSc:                  Epifanio Lamar BRAVO      "

## 2024-08-31 NOTE — Op Note (Signed)
 OPERATIVE REPORT  SURGEON: Redell Shoals, MD   ASSISTANT: Valery Potters, PA-C.  PREOPERATIVE DIAGNOSIS: Displaced Right femoral neck fracture.   POSTOPERATIVE DIAGNOSIS: Displaced Right femoral neck fracture.   PROCEDURE: Right total hip arthroplasty, anterior approach.   IMPLANTS: Biomet Taperloc Reduced Distal stem, size 12 x 144 mm, high offset. Biomet G7 OsseoTi Cup, size 52 mm. Biomet Vivacit-E liner, size 36 mm, E, neutral. Biomet Biolox ceramic head ball, size 36 + 0 mm.  ANESTHESIA:  General  ANTIBIOTICS: 2g ancef .  ESTIMATED BLOOD LOSS:-450 mL    DRAINS: None.  COMPLICATIONS: None   CONDITION: PACU - hemodynamically stable.   BRIEF CLINICAL NOTE: Casey Olson is a 74 y.o. female with a displaced Right femoral neck fracture. The patient was admitted to the hospitalist service and underwent perioperative risk stratification and medical optimization. The risks, benefits, and alternatives to total hip arthroplasty were explained, and the patient elected to proceed.  PROCEDURE IN DETAIL: The patient was taken to the operating room and general anesthesia was induced on the hospital bed.  The patient was then positioned on the Hana table.  All bony prominences were well padded.  The hip was prepped and draped in the normal sterile surgical fashion.  A time-out was called verifying side and site of surgery. Antibiotics were given within 60 minutes of beginning the procedure.   Bikini incision was made, and the direct anterior approach to the hip was performed through the Hueter interval.  Lateral femoral circumflex vessels were treated with the Auqumantys. The anterior capsule was exposed and an inverted T capsulotomy was made.  Fracture hematoma was encountered and evacuated. The patient was found to have a comminuted Right subcapital femoral neck fracture.  I freshened the femoral neck cut with a saw.  I removed the femoral neck fragment.  A corkscrew was placed into the head  and the head was removed.  This was passed to the back table and was measured. The pubofemoral ligament was released subperiosteally to the lesser trochanter.  Acetabular exposure was achieved, and the pulvinar and labrum were excised. Sequential reaming of the acetabulum was then performed up to a size 51 mm reamer under direct visulization. A 52 mm cup was then opened and impacted into place at approximately 40 degrees of abduction and 20 degrees of anteversion. The final polyethylene liner was impacted into place and acetabular osteophytes were removed.    I then gained femoral exposure taking care to protect the abductors and greater trochanter.  This was performed using standard external rotation, extension, and adduction.  A cookie cutter was used to enter the femoral canal, and then the femoral canal finder was placed.  Sequential broaching was performed up to a size 12.  Calcar planer was used on the femoral neck remnant.  I placed a high offset neck and a trial head ball.  The hip was reduced.  Leg lengths and offset were checked fluoroscopically.  The hip was dislocated and trial components were removed.  The final implants were placed, and the hip was reduced.  Fluoroscopy was used to confirm component position and leg lengths.  At 90 degrees of external rotation and full extension, the hip was stable to an anterior directed force.   The wound was copiously irrigated with Irrisept solution and normal saline using pulse lavage.  Marcaine  solution was injected into the periarticular soft tissue.  The wound was closed in layers using #1 Stratafix for the fascia, 2-0 Vicryl for the subcutaneous fat, 2-0  Monocryl for the deep dermal layer, and staples + Dermabond for the skin.  Once the glue was fully dried, an Aquacell Ag dressing was applied.  The patient was transported to the recovery room in stable condition.  Sponge, needle, and instrument counts were correct at the end of the case x2.  The patient  tolerated the procedure well and there were no known complications.  Please note that a surgical assistant was a medical necessity for this procedure to perform it in a safe and expeditious manner. Assistant was necessary to provide appropriate retraction of vital neurovascular structures, to prevent femoral fracture, and to allow for anatomic placement of the prosthesis.

## 2024-09-01 ENCOUNTER — Encounter (HOSPITAL_COMMUNITY): Payer: Self-pay | Admitting: Orthopedic Surgery

## 2024-09-01 DIAGNOSIS — S72001A Fracture of unspecified part of neck of right femur, initial encounter for closed fracture: Secondary | ICD-10-CM | POA: Diagnosis not present

## 2024-09-01 LAB — CBC
HCT: 31.1 % — ABNORMAL LOW (ref 36.0–46.0)
Hemoglobin: 10 g/dL — ABNORMAL LOW (ref 12.0–15.0)
MCH: 28.4 pg (ref 26.0–34.0)
MCHC: 32.2 g/dL (ref 30.0–36.0)
MCV: 88.4 fL (ref 80.0–100.0)
Platelets: 184 10*3/uL (ref 150–400)
RBC: 3.52 MIL/uL — ABNORMAL LOW (ref 3.87–5.11)
RDW: 13.7 % (ref 11.5–15.5)
WBC: 13.3 10*3/uL — ABNORMAL HIGH (ref 4.0–10.5)
nRBC: 0 % (ref 0.0–0.2)

## 2024-09-01 LAB — COMPREHENSIVE METABOLIC PANEL WITH GFR
ALT: 17 U/L (ref 0–44)
AST: 28 U/L (ref 15–41)
Albumin: 3.6 g/dL (ref 3.5–5.0)
Alkaline Phosphatase: 57 U/L (ref 38–126)
Anion gap: 11 (ref 5–15)
BUN: 16 mg/dL (ref 8–23)
CO2: 21 mmol/L — ABNORMAL LOW (ref 22–32)
Calcium: 8.5 mg/dL — ABNORMAL LOW (ref 8.9–10.3)
Chloride: 102 mmol/L (ref 98–111)
Creatinine, Ser: 0.73 mg/dL (ref 0.44–1.00)
GFR, Estimated: >60 mL/min
Glucose, Bld: 134 mg/dL — ABNORMAL HIGH (ref 70–99)
Potassium: 4.3 mmol/L (ref 3.5–5.1)
Sodium: 135 mmol/L (ref 135–145)
Total Bilirubin: 0.4 mg/dL (ref 0.0–1.2)
Total Protein: 6.6 g/dL (ref 6.5–8.1)

## 2024-09-01 LAB — GLUCOSE, CAPILLARY
Glucose-Capillary: 104 mg/dL — ABNORMAL HIGH (ref 70–99)
Glucose-Capillary: 118 mg/dL — ABNORMAL HIGH (ref 70–99)
Glucose-Capillary: 161 mg/dL — ABNORMAL HIGH (ref 70–99)
Glucose-Capillary: 186 mg/dL — ABNORMAL HIGH (ref 70–99)
Glucose-Capillary: 223 mg/dL — ABNORMAL HIGH (ref 70–99)
Glucose-Capillary: 247 mg/dL — ABNORMAL HIGH (ref 70–99)
Glucose-Capillary: 261 mg/dL — ABNORMAL HIGH (ref 70–99)

## 2024-09-01 LAB — MAGNESIUM: Magnesium: 2.1 mg/dL (ref 1.7–2.4)

## 2024-09-01 MED ORDER — ADULT MULTIVITAMIN W/MINERALS CH
1.0000 | ORAL_TABLET | Freq: Every day | ORAL | Status: DC
  Administered 2024-09-01 – 2024-09-03 (×3): 1 via ORAL
  Filled 2024-09-01 (×3): qty 1

## 2024-09-01 MED ORDER — ORAL CARE MOUTH RINSE: 15.0000 mL | OROMUCOSAL | Status: DC | PRN

## 2024-09-01 MED ORDER — ENSURE MAX PROTEIN PO LIQD
11.0000 [oz_av] | Freq: Every day | ORAL | Status: DC
  Administered 2024-09-01 – 2024-09-02 (×2): 11 [oz_av] via ORAL
  Filled 2024-09-01 (×3): qty 330

## 2024-09-01 MED ORDER — LOSARTAN POTASSIUM 25 MG PO TABS
25.0000 mg | ORAL_TABLET | Freq: Every day | ORAL | Status: DC
  Administered 2024-09-01 – 2024-09-03 (×3): 25 mg via ORAL
  Filled 2024-09-01 (×3): qty 1

## 2024-09-01 MED ORDER — SIMVASTATIN 20 MG PO TABS
10.0000 mg | ORAL_TABLET | Freq: Every day | ORAL | Status: DC
  Administered 2024-09-01 – 2024-09-02 (×2): 10 mg via ORAL
  Filled 2024-09-01 (×2): qty 1

## 2024-09-01 MED ORDER — HYDROCODONE-ACETAMINOPHEN 5-325 MG PO TABS: 1.0000 | ORAL_TABLET | ORAL | 0 refills | Status: DC | PRN

## 2024-09-01 MED ORDER — INSULIN ASPART 100 UNIT/ML IJ SOLN
0.0000 [IU] | Freq: Three times a day (TID) | INTRAMUSCULAR | Status: DC
  Administered 2024-09-01: 3 [IU] via SUBCUTANEOUS
  Administered 2024-09-01: 5 [IU] via SUBCUTANEOUS
  Administered 2024-09-02 (×3): 2 [IU] via SUBCUTANEOUS
  Administered 2024-09-03: 3 [IU] via SUBCUTANEOUS
  Administered 2024-09-03: 1 [IU] via SUBCUTANEOUS
  Filled 2024-09-01: qty 2
  Filled 2024-09-01: qty 3
  Filled 2024-09-01: qty 2
  Filled 2024-09-01: qty 3
  Filled 2024-09-01: qty 2
  Filled 2024-09-01: qty 1
  Filled 2024-09-01: qty 5

## 2024-09-01 MED ORDER — ASPIRIN 81 MG PO CHEW: 81.0000 mg | CHEWABLE_TABLET | Freq: Two times a day (BID) | ORAL | 0 refills | Status: AC

## 2024-09-01 NOTE — Progress Notes (Signed)
 Initial Nutrition Assessment  DOCUMENTATION CODES:   Not applicable  INTERVENTION:  - Carb modified diet.  - Ensure Max po BID, each supplement provides 150 kcal and 30 grams of protein.  - Multivitamin with minerals daily.   NUTRITION DIAGNOSIS:   Increased nutrient needs related to hip fracture as evidenced by estimated needs.  GOAL:   Patient will meet greater than or equal to 90% of their needs  MONITOR:   PO intake, Supplement acceptance, Weight trends  REASON FOR ASSESSMENT:   Consult Hip fracture protocol  ASSESSMENT:   74 y/o female with PMH of essential hypertension, HLD, DM 2 on ozempic, peripheral neuropathy, breast cancer s/p R mastectomy in remission who presented after slipping on ice on her way to the mailbox. Admitted for right femoral neck fracture.   Patient in bed at time of visit, daughter at bedside. Patient reports a UBW of 161# and that her weight fluctuates up and down a few pounds but no significant changes In weight over the past year.   She endorses typically eating 2 meals a day, one around lunchtime and one for dinner. Often snacks in between. Eating normally up until admission. States she takes calcium and a multivitamin at home.   Current appetite is decreased and patient reports only eating grits, a blueberry muffin, and cranberry juice for breakfast. Discussed increased nutrient needs to promote healing and encouraged intake of protein rich food sources at all meals.   Ordered patient her desired lunch of a turkey burger, fries, and carrots. She is agreeable to try Ensure Max during admission.   Medications reviewed and include: Senokot  Labs reviewed:  - HA1C 6.6 (as of 1/29) Blood Glucose 104-234 x24 hours  NUTRITION - FOCUSED PHYSICAL EXAM:  Flowsheet Row Most Recent Value  Orbital Region No depletion  Upper Arm Region No depletion  Thoracic and Lumbar Region No depletion  Buccal Region No depletion  Temple Region No  depletion  Clavicle Bone Region No depletion  Clavicle and Acromion Bone Region No depletion  Scapular Bone Region Unable to assess  Dorsal Hand No depletion  Patellar Region No depletion  Anterior Thigh Region No depletion  Posterior Calf Region No depletion  Edema (RD Assessment) None  Hair Reviewed  Eyes Reviewed  Mouth Reviewed  Skin Reviewed  Nails Reviewed    Diet Order:   Diet Order             Diet Carb Modified Room service appropriate? Yes  Diet effective now                   EDUCATION NEEDS:  Education needs have been addressed  Skin:  Skin Assessment: Reviewed RN Assessment  Last BM:  1/28  Height:  Ht Readings from Last 1 Encounters:  08/30/24 5' 3 (1.6 m)   Weight:  Wt Readings from Last 1 Encounters:  08/30/24 73.5 kg   BMI:  Body mass index is 28.7 kg/m.  Estimated Nutritional Needs:  Kcal:  1850-2000 kcals Protein:  75-90 grams Fluid:  >/= 1.9L    Trude Ned RD, LDN Contact via Secure Chat.

## 2024-09-01 NOTE — Progress Notes (Signed)
" ° ° °  Subjective:  Patient reports pain as mild.  Denies N/V/CP/SOB/Abd pain. Denies tingling or numbness in LE bilaterally.  Family at bedside.  All questions solicited and answered.  Void pending.  Up with PT.  Patient reports she will need a walker as she doesn't have one at home.  Normally lives at home with her daughter.   Objective:   VITALS:   Vitals:   08/31/24 1429 08/31/24 2201 09/01/24 0148 09/01/24 0619  BP: 132/77 125/84 (!) 144/66 (!) 134/59  Pulse: 79 92 (!) 102 100  Resp: 14 16 15 16   Temp: 97.6 F (36.4 C) 98.6 F (37 C) 99.1 F (37.3 C) 100.1 F (37.8 C)  TempSrc: Oral Oral Oral Oral  SpO2: 95% 91% 96% 93%  Weight:      Height:        NAD Neurologically intact ABD soft Neurovascular intact Sensation intact distally Intact pulses distally Dorsiflexion/Plantar flexion intact Incision: dressing C/D/I No cellulitis present Compartment soft   Lab Results  Component Value Date   WBC 13.3 (H) 09/01/2024   HGB 10.0 (L) 09/01/2024   HCT 31.1 (L) 09/01/2024   MCV 88.4 09/01/2024   PLT 184 09/01/2024   BMET    Component Value Date/Time   NA 135 09/01/2024 0329   NA 141 04/02/2017 0959   K 4.3 09/01/2024 0329   K 3.4 (L) 04/02/2017 0959   CL 102 09/01/2024 0329   CO2 21 (L) 09/01/2024 0329   CO2 28 04/02/2017 0959   GLUCOSE 134 (H) 09/01/2024 0329   GLUCOSE 49 (L) 04/02/2017 0959   BUN 16 09/01/2024 0329   BUN 11.6 04/02/2017 0959   CREATININE 0.73 09/01/2024 0329   CREATININE 0.72 03/31/2023 0920   CREATININE 0.6 04/02/2017 0959   CALCIUM 8.5 (L) 09/01/2024 0329   CALCIUM 10.0 04/02/2017 0959   EGFR 89 03/31/2023 0920   GFRNONAA >60 09/01/2024 0329     Assessment/Plan: 1 Day Post-Op   Principal Problem:   Hip fracture (HCC) Active Problems:   Peripheral neuropathy   Type 2 diabetes mellitus (HCC)   Essential hypertension   Hyperlipidemia  ABLA hemoglobin 10.0. Continue to monitor.   WBAT with walker DVT ppx: Aspirin , SCDs,  TEDS PO pain control PT/OT Dispo:  - Patient under care of the medical team, disposition per their recommendation.  - Pain medication and DVT ppx printed in chart.    Valery GORMAN Potters 09/01/2024, 8:46 AM   [facility]  [place] [address], [address], [place], [place] [address] Phone: [phone] [email or web address] Facebook  [employer]       "

## 2024-09-01 NOTE — Progress Notes (Signed)
 " PROGRESS NOTE    Casey Olson  FMW:[id number] DOB: [birth year] DOA: 08/30/2024 PCP: Shelda Atlas, MD    Chief Complaint  Patient presents with   Fall   Hip Pain    Brief Narrative:  Pt is a 74 y/o female with a past medical history of essential hypertension on losartan , hyperlipidemia, type 2 DM on ozempic, peripheral neuropathy, breast cancer s/p R mastectomy in remission. She was brought to the ED by EMS after slipping on ice on her way to the mailbox yesterday evening. She denies losing consciousness or hitting her head, but is experiencing acute R hip pain. XR and CT revealed a mildly displaced R femoral neck fracture. Ortho was consulted. Pt is POD1 s/p R THA.    Assessment & Plan:   Principal Problem:   Hip fracture (HCC) Active Problems:   Peripheral neuropathy   Type 2 diabetes mellitus (HCC)   Essential hypertension   Hyperlipidemia   R femoral neck fracture -hold home aspirin  -orthopedics consulted, POD1 s/p R THA  -PT assessment  -monitor post operative bowel function   Normocytic anemia -likely acute due to femoral neck fracture and operative blood loss -trend H&H   Essential HTN -hold losartan  -continue hydralazine  post operatively until discharged   Hyperlipidemia -hold simvastatin     Type 2 DM -last A1C 8.3 (08/21/16) -continue sliding scale insulin  while inpatient  -continue glipizide , metformin , and ozempic as prescribed -follow up with PCP about blood sugar management and prescriptions   Peripheral neuropathy -continue gabapentin  upon discharge   DVT prophylaxis: Aspirin  81mg  and SCDs Code Status: Full Family Communication: Daughter at bedside, had questions about the procedure, was informed that specific procedural questions could be answered by the surgical team Disposition:   Status is: Inpatient Remains inpatient appropriate because: post operative monitoring   Consultants:  Orthopedics: R THA performed 1/29 Transition of  care team: assess home health needs/rehab  Antimicrobials:  2g IV cefazolin  preoperatively    Subjective: Pt says that she feels well today and is not in pain like she was before the surgery. She denies passing gas or having a bowel movement since the procedure. She was able to stand up with the walker last night, but has not been up with PT yet today.   Objective: Vitals:   08/31/24 1429 08/31/24 2201 09/01/24 0148 09/01/24 0619  BP: 132/77 125/84 (!) 144/66 (!) 134/59  Pulse: 79 92 (!) 102 100  Resp: 14 16 15 16   Temp: 97.6 F (36.4 C) 98.6 F (37 C) 99.1 F (37.3 C) 100.1 F (37.8 C)  TempSrc: Oral Oral Oral Oral  SpO2: 95% 91% 96% 93%  Weight:      Height:        Intake/Output Summary (Last 24 hours) at 09/01/2024 0736 Last data filed at 09/01/2024 0648 Gross per 24 hour  Intake 3194.77 ml  Output 2100 ml  Net 1094.77 ml   Filed Weights   08/30/24 1652  Weight: 73.5 kg    Examination:  General exam: Appears calm and comfortable. Not in acute distress Respiratory system: Clear to auscultation. Respiratory effort normal. Cardiovascular system: S1 & S2 heard, RRR. No JVD, murmurs, rubs, gallops or clicks. No pedal edema. Gastrointestinal system: Abdomen is nondistended, soft and nontender to palpation. Central nervous system: Alert and oriented. No focal neurological deficits. Extremities: Sensation and strength intact in bilateral lower extremities.  Skin: No rashes, lesions or ulcers Psychiatry: Judgement and insight appear normal. Flat affect.      Data  Reviewed: I have personally reviewed following labs and imaging studies  CBC: Recent Labs  Lab 08/31/24 0015 08/31/24 0525 09/01/24 0329  WBC 14.0* 10.3 13.3*  NEUTROABS 10.3*  --   --   HGB 12.9 10.8* 10.0*  HCT 40.4 33.4* 31.1*  MCV 88.0 88.1 88.4  PLT 253 213 184    Basic Metabolic Panel: Recent Labs  Lab 08/31/24 0015 09/01/24 0329  NA 138 135  K 4.1 4.3  CL 102 102  CO2 25 21*  GLUCOSE  122* 134*  BUN 16 16  CREATININE 0.59 0.73  CALCIUM 9.8 8.5*  MG  --  2.1    GFR: Estimated Creatinine Clearance: 60.1 mL/min (by C-G formula based on SCr of 0.73 mg/dL).  Liver Function Tests: Recent Labs  Lab 09/01/24 0329  AST 28  ALT 17  ALKPHOS 57  BILITOT 0.4  PROT 6.6  ALBUMIN  3.6    CBG: Recent Labs  Lab 08/31/24 1224 08/31/24 1600 08/31/24 2021 09/01/24 0030 09/01/24 0450  GLUCAP 199* 227* 234* 186* 104*     No results found for this or any previous visit (from the past 240 hours).      Scheduled Meds:  aspirin   81 mg Oral BID   insulin  aspart  0-9 Units Subcutaneous Q4H   ketorolac   7.5 mg Intravenous Q6H   senna  1 tablet Oral BID   Continuous Infusions:  sodium chloride  75 mL/hr at 09/01/24 0648     LOS: 1 day     Charliee Krenz, PA-S [facility]    To contact the attending provider between 7A-7P or the covering provider during after hours 7P-7A, please log into the web site [email or web address] and access using universal  password for that web site. If you do not have the password, please call the hospital operator.  09/01/2024, 7:36 AM   "

## 2024-09-01 NOTE — Progress Notes (Signed)
 " PROGRESS NOTE  Casey Olson FMW:[id number] DOB: [birth year] DOA: 08/30/2024 PCP: Shelda Atlas, MD   LOS: 1 day   Brief Narrative / Interim history: 74 year old female with breast cancer in remission, DM2 with peripheral neuropathy, HTN, HLD, GERD who comes into the hospital with a mechanical fall, on ice while walking to her mailbox.  Found to have a hip fracture, Ortho was consulted and she was taken to the OR for repair on 1/29  Subjective / 24h Interval events: She is doing well this morning, denies any chest pain, denies any shortness of breath.  Assesement and Plan: Principal problem Right femoral neck fracture-orthopedic surgery consulted, she was taken to the OR on 1/29 status post total hip arthroplasty by Dr. Fidel.  She recovered well postoperatively, will ambulate more with physical therapy - Continue pain control, DVT prophylaxis with aspirin  twice daily as per orthopedic surgery  Active problems Essential hypertension-resume losartan  at a lower dose today  Normocytic anemia-monitor postoperatively  Hyperlipidemia-continue statin  DM2-continue sliding scale, most recent A1c 6.6  Lab Results  Component Value Date   HGBA1C 6.6 (H) 08/31/2024   CBG (last 3)  Recent Labs    09/01/24 0030 09/01/24 0450 09/01/24 0800  GLUCAP 186* 104* 118*    Scheduled Meds:  aspirin   81 mg Oral BID   insulin  aspart  0-9 Units Subcutaneous TID WC   ketorolac   7.5 mg Intravenous Q6H   losartan   25 mg Oral Daily   senna  1 tablet Oral BID   simvastatin   10 mg Oral q1800   Continuous Infusions:  sodium chloride  75 mL/hr at 09/01/24 0648   PRN Meds:.acetaminophen , bisacodyl , diphenhydrAMINE , hydrALAZINE , HYDROcodone -acetaminophen , HYDROcodone -acetaminophen , methocarbamol  **OR** methocarbamol  (ROBAXIN ) injection, metoCLOPramide  **OR** metoCLOPramide  (REGLAN ) injection, morphine  injection, naLOXone  (NARCAN )  injection, ondansetron  (ZOFRAN ) IV, mouth rinse, polyethylene  glycol  Current Outpatient Medications  Medication Instructions   acetaminophen  (TYLENOL ) 650 mg, Every 6 hours PRN   aspirin  (ASPIRIN  CHILDRENS) 81 mg, Oral, 2 times daily with meals   [Paused] aspirin  EC 81 mg, Daily   Blood Glucose Monitoring Suppl (ONETOUCH VERIO FLEX SYSTEM) w/Device KIT USE TO TEST 3 TIMES DAILY DX E11.65   Calcium Carbonate-Vitamin D (CALTRATE 600+D PO) 1 tablet, 2 times daily   CEQUA 0.09 % SOLN 1 drop, Ophthalmic, 2 times daily   cetirizine (ZYRTEC) 10 mg, Oral, At bedtime PRN   gabapentin  (NEURONTIN ) 300 mg, Oral, 3 times daily   glipiZIDE  (GLUCOTROL ) 5 mg, Oral, Daily before breakfast   HYDROcodone -acetaminophen  (NORCO/VICODIN) 5-325 MG tablet 1 tablet, Oral, Every 4 hours PRN   lidocaine  (LIDODERM ) 5 % 1 patch, Transdermal, Every 24 hours, Remove & Discard patch within 12 hours or as directed by MD   losartan  (COZAAR ) 100 mg, Oral, Daily   metFORMIN  (GLUCOPHAGE ) 1,000 mg, 2 times daily   Multiple Vitamin (MULTIVITAMIN WITH MINERALS) TABS tablet 1 tablet, Daily   ONETOUCH DELICA LANCETS FINE MISC USE TO TEST 3 TIMES DAILY DX E11.65   ONETOUCH VERIO test strip USE TO TEST 3 TIMES DAILY DX E11.65   Ozempic (2 MG/DOSE) 2 mg, Subcutaneous, Weekly   simvastatin  (ZOCOR ) 10 mg, Daily-1800    Diet Orders (From admission, onward)     Start     Ordered   08/31/24 1419  Diet Carb Modified Room service appropriate? Yes  Diet effective now       Question Answer Comment  Calorie Level Medium 1600-2000   Fluid consistency: Thin   Room service appropriate? Yes  08/31/24 1418            DVT prophylaxis: SCDs Start: 08/31/24 1419 Place TED hose Start: 08/31/24 1419 SCDs Start: 08/31/24 0502   Lab Results  Component Value Date   PLT 184 09/01/2024      Code Status: Full Code  Family Communication: 2 daughters present at bedside  Status is: Inpatient Remains inpatient appropriate because: Severity of illness  Level of care:  Telemetry  Consultants:  Orthopedic surgery  Objective: Vitals:   08/31/24 1429 08/31/24 2201 09/01/24 0148 09/01/24 0619  BP: 132/77 125/84 (!) 144/66 (!) 134/59  Pulse: 79 92 (!) 102 100  Resp: 14 16 15 16   Temp: 97.6 F (36.4 C) 98.6 F (37 C) 99.1 F (37.3 C) 100.1 F (37.8 C)  TempSrc: Oral Oral Oral Oral  SpO2: 95% 91% 96% 93%  Weight:      Height:        Intake/Output Summary (Last 24 hours) at 09/01/2024 1107 Last data filed at 09/01/2024 0648 Gross per 24 hour  Intake 2894.77 ml  Output 2000 ml  Net 894.77 ml   Wt Readings from Last 3 Encounters:  08/30/24 73.5 kg  03/14/24 76.8 kg  03/15/23 80.1 kg    Examination:  Constitutional: NAD Eyes: no scleral icterus ENMT: Mucous membranes are moist.  Neck: normal, supple Respiratory: clear to auscultation bilaterally, no wheezing, no crackles. Normal respiratory effort.  Cardiovascular: Regular rate and rhythm, no murmurs / rubs / gallops. No LE edema.  Abdomen: non distended, no tenderness. Bowel sounds positive.  Musculoskeletal: no clubbing / cyanosis.   Data Reviewed: I have independently reviewed following labs and imaging studies  CBC Recent Labs  Lab 08/31/24 0015 08/31/24 0525 09/01/24 0329  WBC 14.0* 10.3 13.3*  HGB 12.9 10.8* 10.0*  HCT 40.4 33.4* 31.1*  PLT 253 213 184  MCV 88.0 88.1 88.4  MCH 28.1 28.5 28.4  MCHC 31.9 32.3 32.2  RDW 13.5 13.6 13.7  LYMPHSABS 2.2  --   --   MONOABS 1.2*  --   --   EOSABS 0.2  --   --   BASOSABS 0.0  --   --     Recent Labs  Lab 08/31/24 0015 08/31/24 0525 09/01/24 0329  NA 138  --  135  K 4.1  --  4.3  CL 102  --  102  CO2 25  --  21*  GLUCOSE 122*  --  134*  BUN 16  --  16  CREATININE 0.59  --  0.73  CALCIUM 9.8  --  8.5*  AST  --   --  28  ALT  --   --  17  ALKPHOS  --   --  57  BILITOT  --   --  0.4  ALBUMIN   --   --  3.6  MG  --   --  2.1  HGBA1C  --  6.6*  --     ------------------------------------------------------------------------------------------------------------------ No results for input(s): CHOL, HDL, LDLCALC, TRIG, CHOLHDL, LDLDIRECT in the last 72 hours.  Lab Results  Component Value Date   HGBA1C 6.6 (H) 08/31/2024   ------------------------------------------------------------------------------------------------------------------ No results for input(s): TSH, T4TOTAL, T3FREE, THYROIDAB in the last 72 hours.  Invalid input(s): FREET3  Cardiac Enzymes No results for input(s): CKMB, TROPONINI, MYOGLOBIN in the last 168 hours.  Invalid input(s): CK ------------------------------------------------------------------------------------------------------------------ No results found for: BNP  CBG: Recent Labs  Lab 08/31/24 1600 08/31/24 2021 09/01/24 0030 09/01/24 0450 09/01/24 0800  GLUCAP  227* 234* 186* 104* 118*    No results found for this or any previous visit (from the past 240 hours).   Radiology Studies: DG Pelvis Portable Result Date: 08/31/2024 EXAM: 1-2 VIEW(S) XRAY OF THE PELVIS 08/31/2024 01:49:00 PM COMPARISON: 08/30/2024 CLINICAL HISTORY: Closed displaced fracture of right femoral neck (HCC). FINDINGS: BONES AND JOINTS: Interval right total hip arthroplasty using noncemented components. Components appear well seated. No acute fracture. No malalignment. SOFT TISSUES: Subcutaneous emphysema and surgical skin staples lateral to the hip. Vascular calcifications. IMPRESSION: 1. Interval right total hip arthroplasty using noncemented components, with well-seated components. 2. Subcutaneous emphysema and surgical skin staples lateral to the hip consistent with recent surgery . Electronically signed by: Elsie Gravely MD 08/31/2024 09:06 PM EST RP Workstation: [id number]   DG HIP UNILAT WITH PELVIS 1V RIGHT Result Date: 08/31/2024 EXAM: 1 VIEW(S) XRAY OF THE RIGHT HIP 08/31/2024 12:06:00 PM  COMPARISON: Comparison with 08/30/2024. CLINICAL HISTORY: Surgery, elective. FINDINGS: Intraoperative fluoroscopy was utilized for surgical control purposes. Fluoroscopy time: 7 seconds. Dose: 1.32 mGy. Spot images provided: 2. Images demonstrate placement of a right hip arthroplasty using noncemented components. Visualized components appear well seated. IMPRESSION: 1. Intraoperative fluoroscopy was obtained for surgical control purposes, demonstrating placement of a right hip arthroplasty. Electronically signed by: Elsie Gravely MD 08/31/2024 09:04 PM EST RP Workstation: [id number]   DG C-Arm 1-60 Min-No Report Result Date: 08/31/2024 Fluoroscopy was utilized by the requesting physician.  No radiographic interpretation.   DG C-Arm 1-60 Min-No Report Result Date: 08/31/2024 Fluoroscopy was utilized by the requesting physician.  No radiographic interpretation.   Nilda Fendt, MD, PhD [facility]  Between 7 am - 7 pm I am available, please contact me via Amion (for emergencies) or Securechat (non urgent messages)  Between 7 pm - 7 am I am not available, please contact night coverage MD/APP via Amion  "

## 2024-09-01 NOTE — Evaluation (Signed)
 Physical Therapy Evaluation Patient Details Name: Casey Olson MRN: [id number] DOB: [birth year] Today's Date: 09/01/2024  History of Present Illness  Casey Olson is a 74 y.o. female who comes into the hospital with a mechanical fall, on ice while walking to her mailbox. Pt is s/p R THA AA 1/29. PMH: breast cancer in remission, type 2 diabetes with peripheral neuropathy, HTN, hyperlipidemia, GERD  Clinical Impression  Pt is s/p R THA resulting in the deficits listed below (see PT Problem List). Pt reports ind without AD at baseline, lives with daughter and 7yo granddaughter, drives. On eval, pt denies numbness/tingling throughout RLE. Pt noted to have RLE weakness, needing assist to abd/add into and out of bed. Pt needing min-mod A with bed mobility and transfers using RW, minimal R foot clearance with step pivot to BSC to void bladder. Pt HR up to 153 with pivot to [facility], pt denies dizziness, chest pain, chest pressure, shortness of breath, racing sensation - RN into room. Further mobility deferred due to HR. Pt's daughter reports goal is for pt to return home and for family to assist as needed. Recommend HHPT at d/c for safe transition into home environment and further strengthening. Pt will benefit from acute skilled PT to increase their independence and safety with mobility to facilitate discharge.          If plan is discharge home, recommend the following: A little help with walking and/or transfers;A little help with bathing/dressing/bathroom;Assistance with cooking/housework;Assist for transportation   Can travel by private vehicle        Equipment Recommendations Rolling walker (2 wheels)  Recommendations for Other Services       Functional Status Assessment Patient has had a recent decline in their functional status and demonstrates the ability to make significant improvements in function in a reasonable and predictable amount of time.     Precautions / Restrictions  Precautions Precautions: Fall Recall of Precautions/Restrictions: Intact Restrictions Weight Bearing Restrictions Per Provider Order: No RLE Weight Bearing Per Provider Order: Weight bearing as tolerated      Mobility  Bed Mobility Overal bed mobility: Needs Assistance Bed Mobility: Supine to Sit, Sit to Supine     Supine to sit: Min assist, HOB elevated, Used rails Sit to supine: Mod assist, HOB elevated, Used rails   General bed mobility comments: min A to mobilize RLE to bedside, pt using bedrails and HOB elevated to assist trunk; mod A to lift BLE back into bed and reposition to comfort    Transfers Overall transfer level: Needs assistance Equipment used: Rolling walker (2 wheels) Transfers: Sit to/from Stand, Bed to chair/wheelchair/BSC Sit to Stand: Min assist   Step pivot transfers: Min assist       General transfer comment: min A to power to stand and step pivot bed<>BSC, trunk maintains slightly forward flexed, decreased RLE weightbearing and stance time, able to clear foot minimally for step transfer, HR up to 153 noted on tele monitor    Ambulation/Gait               General Gait Details: unable, HR up to 153 with transfer, pt denies dizziness, shortness of breath, racing sensation, pain or pressure at chest  Stairs            Wheelchair Mobility     Tilt Bed    Modified Rankin (Stroke Patients Only)       Balance Overall balance assessment: Needs assistance Sitting-balance support: Feet supported Sitting balance-Leahy Scale: Fair  Standing balance support: Reliant on assistive device for balance, During functional activity, Bilateral upper extremity supported Standing balance-Leahy Scale: Poor                               Pertinent Vitals/Pain Pain Assessment Pain Assessment: Faces Faces Pain Scale: Hurts little more Pain Location: R hip Pain Descriptors / Indicators: Burning Pain Intervention(s): Limited  activity within patient's tolerance, Monitored during session, Repositioned, Ice applied    Home Living Family/patient expects to be discharged to:: Private residence Living Arrangements: Children (daughter and 28 yo granddaughter) Available Help at Discharge: Family Type of Home: House Home Access: Level entry       Home Layout: One level Home Equipment: BSC/3in1      Prior Function Prior Level of Function : Independent/Modified Independent;Driving             Mobility Comments: pt reports ind without AD at baseline ADLs Comments: pt reports ind without AD at baseline     Extremity/Trunk Assessment   Upper Extremity Assessment Upper Extremity Assessment: Overall WFL for tasks assessed    Lower Extremity Assessment Lower Extremity Assessment: RLE deficits/detail RLE Deficits / Details: ankle AROM WFL, able to perform quad set, full knee extension in supine, unable to abduct LE in supine RLE Sensation: WNL RLE Coordination: WNL    Cervical / Trunk Assessment Cervical / Trunk Assessment: Normal  Communication   Communication Communication: No apparent difficulties    Cognition Arousal: Alert Behavior During Therapy: WFL for tasks assessed/performed   PT - Cognitive impairments: No apparent impairments                         Following commands: Intact       Cueing       General Comments General comments (skin integrity, edema, etc.): HR up to 153 max with transfer, improves to 115 once return to supine in bed - pt without complaints, RN into room on phone with tele    Exercises     Assessment/Plan    PT Assessment Patient needs continued PT services  PT Problem List Decreased strength;Decreased range of motion;Decreased activity tolerance;Decreased balance;Decreased mobility;Decreased knowledge of use of DME;Decreased safety awareness;Pain       PT Treatment Interventions DME instruction;Gait training;Stair training;Functional mobility  training;Therapeutic activities;Therapeutic exercise;Balance training;Cognitive remediation;Patient/family education;Wheelchair mobility training;Modalities    PT Goals (Current goals can be found in the Care Plan section)  Acute Rehab PT Goals Patient Stated Goal: return home with daughter assisting PT Goal Formulation: With patient/family Time For Goal Achievement: 09/15/24 Potential to Achieve Goals: Good    Frequency Min 5X/week     Co-evaluation               AM-PAC PT 6 Clicks Mobility  Outcome Measure Help needed turning from your back to your side while in a flat bed without using bedrails?: A Little Help needed moving from lying on your back to sitting on the side of a flat bed without using bedrails?: A Lot Help needed moving to and from a bed to a chair (including a wheelchair)?: A Little Help needed standing up from a chair using your arms (e.g., wheelchair or bedside chair)?: A Little Help needed to walk in hospital room?: A Lot Help needed climbing 3-5 steps with a railing? : Total 6 Click Score: 14    End of Session Equipment Utilized During Treatment: Gait  belt Activity Tolerance: Patient tolerated treatment well;Other (comment) (HR limiting) Patient left: in bed;with call bell/phone within reach;with bed alarm set;with family/visitor present Nurse Communication: Mobility status;Other (comment) (HR) PT Visit Diagnosis: Other abnormalities of gait and mobility (R26.89);Muscle weakness (generalized) (M62.81);Pain;Difficulty in walking, not elsewhere classified (R26.2) Pain - Right/Left: Right Pain - part of body: Hip    Time: 8672-8594 PT Time Calculation (min) (ACUTE ONLY): 38 min   Charges:   PT Evaluation $PT Eval Moderate Complexity: 1 Mod PT Treatments $Therapeutic Activity: 8-22 mins PT General Charges $$ ACUTE PT VISIT: 1 Visit         Tori Timmey Lamba PT, DPT 09/01/24, 4:03 PM

## 2024-09-02 ENCOUNTER — Inpatient Hospital Stay (HOSPITAL_COMMUNITY)

## 2024-09-02 DIAGNOSIS — S72001A Fracture of unspecified part of neck of right femur, initial encounter for closed fracture: Secondary | ICD-10-CM | POA: Diagnosis not present

## 2024-09-02 DIAGNOSIS — M7989 Other specified soft tissue disorders: Secondary | ICD-10-CM | POA: Diagnosis not present

## 2024-09-02 LAB — CBC
HCT: 29.4 % — ABNORMAL LOW (ref 36.0–46.0)
Hemoglobin: 9.7 g/dL — ABNORMAL LOW (ref 12.0–15.0)
MCH: 28.4 pg (ref 26.0–34.0)
MCHC: 33 g/dL (ref 30.0–36.0)
MCV: 86 fL (ref 80.0–100.0)
Platelets: 173 10*3/uL (ref 150–400)
RBC: 3.42 MIL/uL — ABNORMAL LOW (ref 3.87–5.11)
RDW: 13.7 % (ref 11.5–15.5)
WBC: 11.9 10*3/uL — ABNORMAL HIGH (ref 4.0–10.5)
nRBC: 0 % (ref 0.0–0.2)

## 2024-09-02 LAB — BASIC METABOLIC PANEL WITH GFR
Anion gap: 9 (ref 5–15)
BUN: 11 mg/dL (ref 8–23)
CO2: 24 mmol/L (ref 22–32)
Calcium: 8.6 mg/dL — ABNORMAL LOW (ref 8.9–10.3)
Chloride: 104 mmol/L (ref 98–111)
Creatinine, Ser: 0.59 mg/dL (ref 0.44–1.00)
GFR, Estimated: >60 mL/min
Glucose, Bld: 174 mg/dL — ABNORMAL HIGH (ref 70–99)
Potassium: 3.8 mmol/L (ref 3.5–5.1)
Sodium: 137 mmol/L (ref 135–145)

## 2024-09-02 LAB — GLUCOSE, CAPILLARY
Glucose-Capillary: 169 mg/dL — ABNORMAL HIGH (ref 70–99)
Glucose-Capillary: 195 mg/dL — ABNORMAL HIGH (ref 70–99)
Glucose-Capillary: 174 mg/dL — ABNORMAL HIGH (ref 70–99)
Glucose-Capillary: 135 mg/dL — ABNORMAL HIGH (ref 70–99)

## 2024-09-02 NOTE — Progress Notes (Signed)
 " PROGRESS NOTE  Casey Olson FMW:[id number] DOB: Jul 06, 1951 DOA: 08/30/2024 PCP: Shelda Atlas, MD   LOS: 2 days   Brief Narrative / Interim history: 74 year old female with breast cancer in remission, DM2 with peripheral neuropathy, HTN, HLD, GERD who comes into the hospital with a mechanical fall, on ice while walking to her mailbox.  Found to have a hip fracture, Ortho was consulted and she was taken to the OR for repair on 1/29  Subjective / 24h Interval events: Doing well, still has RLE swelling. No chest pain, no shortness of breath  Assesement and Plan: Principal problem Right femoral neck fracture-orthopedic surgery consulted, she was taken to the OR on 1/29 status post total hip arthroplasty by Dr. Fidel.  She is recovering well - Continue pain control, DVT prophylaxis with aspirin  twice daily as per orthopedic surgery - Did better with PT today, will have another session tomorrow prior to discharge  Active problems Essential hypertension-continue losartan , blood pressure overall acceptable  Normocytic anemia-monitor postoperatively hemoglobin overall stable  Hyperlipidemia-continue statin  DM2-continue sliding scale, most recent A1c 6.6  Lab Results  Component Value Date   HGBA1C 6.6 (H) 08/31/2024   CBG (last 3)  Recent Labs    09/01/24 2230 09/02/24 0719 09/02/24 1152  GLUCAP 223* 169* 195*    Scheduled Meds:  aspirin   81 mg Oral BID   insulin  aspart  0-9 Units Subcutaneous TID WC   losartan   25 mg Oral Daily   multivitamin with minerals  1 tablet Oral Daily   Ensure Max Protein  11 oz Oral Daily   senna  1 tablet Oral BID   simvastatin   10 mg Oral q1800   Continuous Infusions:  sodium chloride  Stopped (09/01/24 0946)   PRN Meds:.acetaminophen , bisacodyl , diphenhydrAMINE , hydrALAZINE , HYDROcodone -acetaminophen , HYDROcodone -acetaminophen , methocarbamol  **OR** methocarbamol  (ROBAXIN ) injection, metoCLOPramide  **OR** metoCLOPramide  (REGLAN )  injection, morphine  injection, naLOXone  (NARCAN )  injection, ondansetron  (ZOFRAN ) IV, mouth rinse, polyethylene glycol  Current Outpatient Medications  Medication Instructions   acetaminophen  (TYLENOL ) 650 mg, Every 6 hours PRN   aspirin  (ASPIRIN  CHILDRENS) 81 mg, Oral, 2 times daily with meals   [Paused] aspirin  EC 81 mg, Daily   Blood Glucose Monitoring Suppl (ONETOUCH VERIO FLEX SYSTEM) w/Device KIT USE TO TEST 3 TIMES DAILY DX E11.65   Calcium Carbonate-Vitamin D (CALTRATE 600+D PO) 1 tablet, 2 times daily   CEQUA 0.09 % SOLN 1 drop, Ophthalmic, 2 times daily   cetirizine (ZYRTEC) 10 mg, Oral, At bedtime PRN   gabapentin  (NEURONTIN ) 300 mg, Oral, 3 times daily   glipiZIDE  (GLUCOTROL ) 5 mg, Oral, Daily before breakfast   HYDROcodone -acetaminophen  (NORCO/VICODIN) 5-325 MG tablet 1 tablet, Oral, Every 4 hours PRN   lidocaine  (LIDODERM ) 5 % 1 patch, Transdermal, Every 24 hours, Remove & Discard patch within 12 hours or as directed by MD   losartan  (COZAAR ) 100 mg, Oral, Daily   metFORMIN  (GLUCOPHAGE ) 1,000 mg, 2 times daily   Multiple Vitamin (MULTIVITAMIN WITH MINERALS) TABS tablet 1 tablet, Daily   ONETOUCH DELICA LANCETS FINE MISC USE TO TEST 3 TIMES DAILY DX E11.65   ONETOUCH VERIO test strip USE TO TEST 3 TIMES DAILY DX E11.65   Ozempic (2 MG/DOSE) 2 mg, Subcutaneous, Weekly   simvastatin  (ZOCOR ) 10 mg, Daily-1800    Diet Orders (From admission, onward)     Start     Ordered   08/31/24 1419  Diet Carb Modified Room service appropriate? Yes  Diet effective now       Question Answer Comment  Calorie Level Medium 1600-2000   Fluid consistency: Thin   Room service appropriate? Yes      08/31/24 1418            DVT prophylaxis: SCDs Start: 08/31/24 1419 Place TED hose Start: 08/31/24 1419   Lab Results  Component Value Date   PLT 173 09/02/2024      Code Status: Full Code  Family Communication: 2 daughters present at bedside  Status is: Inpatient Remains  inpatient appropriate because: Severity of illness  Level of care: Telemetry  Consultants:  Orthopedic surgery  Objective: Vitals:   09/01/24 0619 09/01/24 2228 09/02/24 0607 09/02/24 1327  BP: (!) 134/59 (!) 149/67 (!) 147/81 119/60  Pulse: 100 96 100 86  Resp: 16 17 17 16   Temp: 100.1 F (37.8 C) (!) 100.9 F (38.3 C) 99.9 F (37.7 C) 98.1 F (36.7 C)  TempSrc: Oral Oral Oral Oral  SpO2: 93% 97% 96% 100%  Weight:      Height:        Intake/Output Summary (Last 24 hours) at 09/02/2024 1330 Last data filed at 09/02/2024 1328 Gross per 24 hour  Intake 365.87 ml  Output --  Net 365.87 ml   Wt Readings from Last 3 Encounters:  08/30/24 73.5 kg  03/14/24 76.8 kg  03/15/23 80.1 kg    Examination:  Constitutional: NAD Eyes: lids and conjunctivae normal, no scleral icterus ENMT: mmm Neck: normal, supple Respiratory: clear to auscultation bilaterally, no wheezing, no crackles. Normal respiratory effort.  Cardiovascular: Regular rate and rhythm, no murmurs / rubs / gallops. No LE edema. Abdomen: soft, no distention, no tenderness. Bowel sounds positive.   Data Reviewed: I have independently reviewed following labs and imaging studies  CBC Recent Labs  Lab 08/31/24 0015 08/31/24 0525 09/01/24 0329 09/02/24 0333  WBC 14.0* 10.3 13.3* 11.9*  HGB 12.9 10.8* 10.0* 9.7*  HCT 40.4 33.4* 31.1* 29.4*  PLT 253 213 184 173  MCV 88.0 88.1 88.4 86.0  MCH 28.1 28.5 28.4 28.4  MCHC 31.9 32.3 32.2 33.0  RDW 13.5 13.6 13.7 13.7  LYMPHSABS 2.2  --   --   --   MONOABS 1.2*  --   --   --   EOSABS 0.2  --   --   --   BASOSABS 0.0  --   --   --     Recent Labs  Lab 08/31/24 0015 08/31/24 0525 09/01/24 0329 09/02/24 0333  NA 138  --  135 137  K 4.1  --  4.3 3.8  CL 102  --  102 104  CO2 25  --  21* 24  GLUCOSE 122*  --  134* 174*  BUN 16  --  16 11  CREATININE 0.59  --  0.73 0.59  CALCIUM 9.8  --  8.5* 8.6*  AST  --   --  28  --   ALT  --   --  17  --   ALKPHOS  --    --  57  --   BILITOT  --   --  0.4  --   ALBUMIN   --   --  3.6  --   MG  --   --  2.1  --   HGBA1C  --  6.6*  --   --    ------------------------------------------------------------------------------------------------------------------ No results for input(s): CHOL, HDL, LDLCALC, TRIG, CHOLHDL, LDLDIRECT in the last 72 hours.  Lab Results  Component Value Date   HGBA1C 6.6 (H) 08/31/2024   ------------------------------------------------------------------------------------------------------------------  No results for input(s): TSH, T4TOTAL, T3FREE, THYROIDAB in the last 72 hours.  Invalid input(s): FREET3  Cardiac Enzymes No results for input(s): CKMB, TROPONINI, MYOGLOBIN in the last 168 hours.  Invalid input(s): CK ------------------------------------------------------------------------------------------------------------------ No results found for: BNP  CBG: Recent Labs  Lab 09/01/24 1702 09/01/24 1949 09/01/24 2230 09/02/24 0719 09/02/24 1152  GLUCAP 261* 161* 223* 169* 195*    No results found for this or any previous visit (from the past 240 hours).   Radiology Studies: VAS US  LOWER EXTREMITY VENOUS (DVT) Result Date: 09/02/2024  Lower Venous DVT Study Patient Name:  Casey Olson [facility]  Date of Exam:   09/02/2024 Medical Rec #: [id number]          Accession #:    [id number] Date of Birth: Apr 25, 1951          Patient Gender: F Patient Age:   15 years Exam Location:  [facility] Procedure:      VAS US  LOWER EXTREMITY VENOUS (DVT) Referring Phys: Khristen Cheyney --------------------------------------------------------------------------------  Indications: Edema, and Status post fall resulting in displaced right femoral neck fracture and right total hip arthroplasty, anterior approach 08/31/24  Risk Factors: Cancer: Right breast 07/2016, now in remission. Limitations: Bandages and Inability to rotate right leg secondary to hip surgery.  Comparison Study: No prior study on file Performing Technologist: Alberta Lis RVS  Examination Guidelines: A complete evaluation includes B-mode imaging, spectral Doppler, color Doppler, and power Doppler as needed of all accessible portions of each vessel. Bilateral testing is considered an integral part of a complete examination. Limited examinations for reoccurring indications may be performed as noted. The reflux portion of the exam is performed with the patient in reverse Trendelenburg.  +---------+---------------+---------+-----------+----------+-------------------+ RIGHT    CompressibilityPhasicitySpontaneityPropertiesThrombus Aging      +---------+---------------+---------+-----------+----------+-------------------+ CFV                     Yes      Yes                  patent by color and                                                       Doppler             +---------+---------------+---------+-----------+----------+-------------------+ SFJ                                                   Not well visualized +---------+---------------+---------+-----------+----------+-------------------+ FV Prox  Full           Yes      Yes                                      +---------+---------------+---------+-----------+----------+-------------------+ FV Mid   Full           Yes      Yes                                      +---------+---------------+---------+-----------+----------+-------------------+ FV  DistalFull           Yes      Yes                                      +---------+---------------+---------+-----------+----------+-------------------+ PFV      Full           Yes      Yes                                      +---------+---------------+---------+-----------+----------+-------------------+ POP                     Yes      Yes                  patent by color and                                                       Doppler              +---------+---------------+---------+-----------+----------+-------------------+ PTV      Full                                                             +---------+---------------+---------+-----------+----------+-------------------+ PERO     Full                                                             +---------+---------------+---------+-----------+----------+-------------------+   +---------+---------------+---------+-----------+----------+--------------+ LEFT     CompressibilityPhasicitySpontaneityPropertiesThrombus Aging +---------+---------------+---------+-----------+----------+--------------+ CFV      Full           Yes      Yes                                 +---------+---------------+---------+-----------+----------+--------------+ SFJ      Full                                                        +---------+---------------+---------+-----------+----------+--------------+ FV Prox  Full           Yes      Yes                                 +---------+---------------+---------+-----------+----------+--------------+ FV Mid   Full           Yes      Yes                                 +---------+---------------+---------+-----------+----------+--------------+  FV DistalFull           Yes      Yes                                 +---------+---------------+---------+-----------+----------+--------------+ PFV      Full           Yes      Yes                                 +---------+---------------+---------+-----------+----------+--------------+ POP      Full           Yes      Yes                                 +---------+---------------+---------+-----------+----------+--------------+ PTV      Full                                                        +---------+---------------+---------+-----------+----------+--------------+ PERO     Full                                                         +---------+---------------+---------+-----------+----------+--------------+     Summary: RIGHT: - There is no evidence of deep vein thrombosis in the lower extremity.  LEFT: - There is no evidence of deep vein thrombosis in the lower extremity.  *See table(s) above for measurements and observations. Electronically signed by Gaile New MD on 09/02/2024 at 1:24:49 PM.    Final    Nilda Fendt, MD, PhD [facility]  Between 7 am - 7 pm I am available, please contact me via Amion (for emergencies) or Securechat (non urgent messages)  Between 7 pm - 7 am I am not available, please contact night coverage MD/APP via Amion  "

## 2024-09-02 NOTE — Progress Notes (Signed)
 Physical Therapy Treatment Patient Details Name: MARIANE BURPEE MRN: [id number] DOB: [birth year] Today's Date: 09/02/2024   History of Present Illness BRILYNN BIASI is a 74 y.o. female who comes into the hospital with a mechanical fall, on ice while walking to her mailbox. Pt is s/p R THA AA 1/29. PMH: breast cancer in remission, type 2 diabetes with peripheral neuropathy, HTN, hyperlipidemia, GERD    PT Comments  Pt progressing well today. Tolerated incr amb distance ~ 58' with RW and CGA for safety. HR max 130 with activity. Pain controlled during session.  Plan is for HHPT.  Continue to follow in acute setting.     If plan is discharge home, recommend the following: A little help with walking and/or transfers;A little help with bathing/dressing/bathroom;Assistance with cooking/housework;Assist for transportation   Can travel by private vehicle        Equipment Recommendations  Rolling walker (2 wheels)    Recommendations for Other Services       Precautions / Restrictions Precautions Precautions: Fall Recall of Precautions/Restrictions: Intact Restrictions Weight Bearing Restrictions Per Provider Order: No RLE Weight Bearing Per Provider Order: Weight bearing as tolerated     Mobility  Bed Mobility Overal bed mobility: Needs Assistance Bed Mobility: Supine to Sit     Supine to sit: Min assist     General bed mobility comments: HOB at 30 degrees, cues for sequence and self assist. min assist to intiate RLE movement toward EOB, pt able to self progress using leg lifter. incr time needed    Transfers Overall transfer level: Needs assistance Equipment used: Rolling walker (2 wheels) Transfers: Sit to/from Stand Sit to Stand: Contact guard assist           General transfer comment: cues to power up and for hand placement; CGA for safety    Ambulation/Gait Ambulation/Gait assistance: Contact guard assist Gait Distance (Feet): 30 Feet Assistive device:  Rolling walker (2 wheels) Gait Pattern/deviations: Step-to pattern, Decreased stance time - right, Decreased step length - right, Decreased step length - left Gait velocity: decr     General Gait Details: cues for sequence, pt requires incr time to advance  RLE. rmoderate reliance on UEs, no LOB   Stairs             Wheelchair Mobility     Tilt Bed    Modified Rankin (Stroke Patients Only)       Balance   Sitting-balance support: Feet supported, No upper extremity supported Sitting balance-Leahy Scale: Fair     Standing balance support: Reliant on assistive device for balance, During functional activity, Bilateral upper extremity supported Standing balance-Leahy Scale: Fair                              Hotel Manager: No apparent difficulties  Cognition Arousal: Alert Behavior During Therapy: WFL for tasks assessed/performed   PT - Cognitive impairments: No apparent impairments                         Following commands: Intact      Cueing Cueing Techniques: Verbal cues  Exercises Total Joint Exercises Ankle Circles/Pumps: AROM, Both, 10 reps    General Comments        Pertinent Vitals/Pain Pain Assessment Pain Assessment: Faces Faces Pain Scale: Hurts little more Pain Location: R hip Pain Descriptors / Indicators: Discomfort, Guarding Pain Intervention(s): Limited activity within patient's tolerance,  Monitored during session, Premedicated before session, Repositioned    Home Living                          Prior Function            PT Goals (current goals can now be found in the care plan section) Acute Rehab PT Goals Patient Stated Goal: return home with daughter assisting PT Goal Formulation: With patient/family Time For Goal Achievement: 09/15/24 Potential to Achieve Goals: Good Progress towards PT goals: Progressing toward goals    Frequency    Min 6X/week      PT  Plan      Co-evaluation              AM-PAC PT 6 Clicks Mobility   Outcome Measure  Help needed turning from your back to your side while in a flat bed without using bedrails?: A Little Help needed moving from lying on your back to sitting on the side of a flat bed without using bedrails?: A Little Help needed moving to and from a bed to a chair (including a wheelchair)?: A Little Help needed standing up from a chair using your arms (e.g., wheelchair or bedside chair)?: A Little Help needed to walk in hospital room?: A Little Help needed climbing 3-5 steps with a railing? : A Lot 6 Click Score: 17    End of Session Equipment Utilized During Treatment: Gait belt Activity Tolerance: Patient tolerated treatment well;Patient limited by fatigue Patient left: in chair;with call bell/phone within reach;with chair alarm set   PT Visit Diagnosis: Other abnormalities of gait and mobility (R26.89);Muscle weakness (generalized) (M62.81);Pain;Difficulty in walking, not elsewhere classified (R26.2) Pain - Right/Left: Right Pain - part of body: Hip     Time: 1209-1233 PT Time Calculation (min) (ACUTE ONLY): 24 min  Charges:    $Gait Training: 8-22 mins $Therapeutic Activity: 8-22 mins PT General Charges $$ ACUTE PT VISIT: 1 Visit                     Marynell Bies, PT  Acute Rehab Dept [facility]) [phone]  09/02/2024    [facility] 09/02/2024, 12:51 PM

## 2024-09-02 NOTE — TOC Transition Note (Signed)
 Transition of Care [facility]) - Discharge Note   Patient Details  Name: Casey Olson MRN: [id number] Date of Birth: 06-10-1951  [facility]) CM/SW Contact:  NORMAN ASPEN, LCSW Phone Number: 09/02/2024, 11:53 AM   Clinical Narrative:     DME and HH already set up.  No further IP CM needs.     Barriers to Discharge: Barriers Resolved   Patient Goals and CMS Choice Patient states their goals for this hospitalization and ongoing recovery are:: return home with family          Discharge Placement                       Discharge Plan and Services Additional resources added to the After Visit Summary for   In-house Referral: Clinical Social Work              DME Arranged: Vannie rolling DME Agency: [employer] Date DME Agency Contacted: 09/02/24 Time DME Agency Contacted: 1101   HH Arranged: PT HH Agency: [facility] Date HH Agency Contacted: 09/02/24 Time HH Agency Contacted: 1153    Social Drivers of Health (SDOH) Interventions SDOH Screenings   Food Insecurity: No Food Insecurity (08/31/2024)  Housing: Low Risk (08/31/2024)  Transportation Needs: No Transportation Needs (08/31/2024)  Utilities: Not At Risk (08/31/2024)  Social Connections: Patient Declined (08/31/2024)  Tobacco Use: Low Risk (08/31/2024)     Readmission Risk Interventions     No data to display

## 2024-09-02 NOTE — Progress Notes (Signed)
 VASCULAR LAB    Bilateral lower extremity venous duplex has been performed.  See CV proc for preliminary results.   Ellard Nan, RVT 09/02/2024, 12:00 PM

## 2024-09-02 NOTE — Plan of Care (Signed)
" °  Problem: Coping: Goal: Ability to adjust to condition or change in health will improve Outcome: Progressing   Problem: Metabolic: Goal: Ability to maintain appropriate glucose levels will improve Outcome: Progressing   Problem: Nutritional: Goal: Maintenance of adequate nutrition will improve Outcome: Progressing   Problem: Skin Integrity: Goal: Risk for impaired skin integrity will decrease Outcome: Progressing   Problem: Tissue Perfusion: Goal: Adequacy of tissue perfusion will improve Outcome: Progressing   Problem: Clinical Measurements: Goal: Ability to maintain clinical measurements within normal limits will improve Outcome: Progressing   Problem: Pain Managment: Goal: General experience of comfort will improve and/or be controlled Outcome: Progressing   Problem: Safety: Goal: Ability to remain free from injury will improve Outcome: Progressing   "

## 2024-09-03 ENCOUNTER — Other Ambulatory Visit (HOSPITAL_COMMUNITY): Payer: Self-pay

## 2024-09-03 ENCOUNTER — Encounter: Payer: Self-pay | Admitting: Hematology and Oncology

## 2024-09-03 DIAGNOSIS — S72001A Fracture of unspecified part of neck of right femur, initial encounter for closed fracture: Secondary | ICD-10-CM | POA: Diagnosis not present

## 2024-09-03 LAB — GLUCOSE, CAPILLARY
Glucose-Capillary: 132 mg/dL — ABNORMAL HIGH (ref 70–99)
Glucose-Capillary: 236 mg/dL — ABNORMAL HIGH (ref 70–99)

## 2024-09-03 MED ORDER — HYDROCODONE-ACETAMINOPHEN 5-325 MG PO TABS
1.0000 | ORAL_TABLET | ORAL | 0 refills | Status: AC | PRN
  Filled 2024-09-03: qty 42, 7d supply, fill #0

## 2024-09-03 NOTE — Plan of Care (Signed)
" °  Problem: Health Behavior/Discharge Planning: Goal: Ability to manage health-related needs will improve Outcome: Progressing   Problem: Clinical Measurements: Goal: Ability to maintain clinical measurements within normal limits will improve Outcome: Progressing Goal: Will remain free from infection Outcome: Progressing Goal: Diagnostic test results will improve Outcome: Progressing Goal: Respiratory complications will improve Outcome: Progressing Goal: Cardiovascular complication will be avoided Outcome: Progressing   Problem: Activity: Goal: Risk for activity intolerance will decrease Outcome: Progressing   Problem: Nutrition: Goal: Adequate nutrition will be maintained Outcome: Progressing   Problem: Coping: Goal: Level of anxiety will decrease Outcome: Progressing   Problem: Elimination: Goal: Will not experience complications related to bowel motility Outcome: Progressing Goal: Will not experience complications related to urinary retention Outcome: Progressing   Problem: Pain Managment: Goal: General experience of comfort will improve and/or be controlled Outcome: Progressing   Problem: Safety: Goal: Ability to remain free from injury will improve Outcome: Progressing   Problem: Skin Integrity: Goal: Risk for impaired skin integrity will decrease Outcome: Progressing   Problem: Education: Goal: Knowledge of the prescribed therapeutic regimen will improve Outcome: Progressing   Problem: Bowel/Gastric: Goal: Gastrointestinal status for postoperative course will improve Outcome: Progressing   Problem: Cardiac: Goal: Ability to maintain an adequate cardiac output Outcome: Progressing Goal: Will show no evidence of cardiac arrhythmias Outcome: Progressing   Problem: Nutritional: Goal: Will attain and maintain optimal nutritional status Outcome: Progressing   Problem: Neurological: Goal: Will regain or maintain usual level of consciousness Outcome:  Progressing   Problem: Clinical Measurements: Goal: Ability to maintain clinical measurements within normal limits Outcome: Progressing Goal: Postoperative complications will be avoided or minimized Outcome: Progressing   Problem: Respiratory: Goal: Will regain and/or maintain adequate ventilation Outcome: Progressing Goal: Respiratory status will improve Outcome: Progressing   Problem: Skin Integrity: Goal: Demonstrates signs of wound healing without infection Outcome: Progressing   Problem: Urinary Elimination: Goal: Will remain free from infection Outcome: Progressing Goal: Ability to achieve and maintain adequate urine output Outcome: Progressing   Problem: Education: Goal: Knowledge of the prescribed therapeutic regimen will improve Outcome: Progressing Goal: Understanding of discharge needs will improve Outcome: Progressing Goal: Individualized Educational Video(s) Outcome: Progressing   Problem: Activity: Goal: Ability to avoid complications of mobility impairment will improve Outcome: Progressing Goal: Ability to tolerate increased activity will improve Outcome: Progressing   Problem: Clinical Measurements: Goal: Postoperative complications will be avoided or minimized Outcome: Progressing   Problem: Pain Management: Goal: Pain level will decrease with appropriate interventions Outcome: Progressing   Problem: Skin Integrity: Goal: Will show signs of wound healing Outcome: Progressing   "

## 2024-09-03 NOTE — Progress Notes (Signed)
 Physical Therapy Treatment Patient Details Name: Casey Olson MRN: [id number] DOB: [birth year] Today's Date: 09/03/2024   History of Present Illness Casey Olson is a 74 y.o. female who comes into the hospital with a mechanical fall, on ice while walking to her mailbox. Pt is s/p R THA AA 1/29. PMH: breast cancer in remission, type 2 diabetes with peripheral neuropathy, HTN, hyperlipidemia, GERD    PT Comments  Pt is progressing well this session. Reports she amb to bathroom with nursing staff earlier. Pt is Ox3 but continues to require cues for initiation and sequencing of tasks. Pt amb ~ 102' with RW and CGA  to supervision for safety. Reviewed THA HEP. Pt reports she has assist from her dtr at home, will benefit from HHPT at d/c    If plan is discharge home, recommend the following: A little help with walking and/or transfers;A little help with bathing/dressing/bathroom;Assistance with cooking/housework;Assist for transportation   Can travel by private vehicle        Equipment Recommendations  Rolling walker (2 wheels)    Recommendations for Other Services       Precautions / Restrictions Precautions Precautions: Fall Recall of Precautions/Restrictions: Intact Restrictions Weight Bearing Restrictions Per Provider Order: No RLE Weight Bearing Per Provider Order: Weight bearing as tolerated     Mobility  Bed Mobility               General bed mobility comments:  (pt in recliner pre and post session)    Transfers Overall transfer level: Needs assistance Equipment used: Rolling walker (2 wheels) Transfers: Sit to/from Stand Sit to Stand: Contact guard assist, Supervision           General transfer comment: multi-modal cues to power up and for hand placement; CGA to supervision  for safety. incr time needed    Ambulation/Gait Ambulation/Gait assistance: Contact guard assist, Supervision Gait Distance (Feet): 36 Feet Assistive device: Rolling walker (2  wheels) Gait Pattern/deviations: Step-to pattern, Decreased stance time - right, Decreased step length - right, Decreased step length - left Gait velocity: decr     General Gait Details: verbal  cues for sequence and RW position. pt requires incr time to advance  RLE. decreased reliance on UEs, no overt LOB   Stairs             Wheelchair Mobility     Tilt Bed    Modified Rankin (Stroke Patients Only)       Balance   Sitting-balance support: Feet supported, No upper extremity supported Sitting balance-Leahy Scale: Fair     Standing balance support: Reliant on assistive device for balance, During functional activity, Bilateral upper extremity supported Standing balance-Leahy Scale: Fair (CGA for safety)                              Communication Communication Communication: No apparent difficulties  Cognition Arousal: Alert Behavior During Therapy: WFL for tasks assessed/performed   PT - Cognitive impairments: Initiation, Sequencing, No family/caregiver present to determine baseline                       PT - Cognition Comments: pt requires repetitive step by step cues for sequencing basic tasks, demonstrates ltd carryover from prior sessions. slow to initiate, requires frequent cues for task completion Following commands: Intact      Cueing Cueing Techniques: Verbal cues  Exercises Total Joint Exercises Ankle Circles/Pumps: AROM, Both,  10 reps Quad Sets: AROM, Both, 5 reps Heel Slides: AAROM, Right, 10 reps Hip ABduction/ADduction: AAROM, Right, 10 reps    General Comments        Pertinent Vitals/Pain Pain Assessment Pain Assessment: 0-10 Faces Pain Scale: Hurts little more Pain Location: R hip Pain Descriptors / Indicators: Discomfort, Guarding Pain Intervention(s): Limited activity within patient's tolerance, Monitored during session, Premedicated before session, Repositioned    Home Living                           Prior Function            PT Goals (current goals can now be found in the care plan section) Acute Rehab PT Goals Patient Stated Goal: return home with daughter assisting PT Goal Formulation: With patient/family Time For Goal Achievement: 09/15/24 Potential to Achieve Goals: Good Progress towards PT goals: Progressing toward goals    Frequency    Min 6X/week      PT Plan      Co-evaluation              AM-PAC PT 6 Clicks Mobility   Outcome Measure  Help needed turning from your back to your side while in a flat bed without using bedrails?: A Little Help needed moving from lying on your back to sitting on the side of a flat bed without using bedrails?: A Little Help needed moving to and from a bed to a chair (including a wheelchair)?: A Little Help needed standing up from a chair using your arms (e.g., wheelchair or bedside chair)?: A Little Help needed to walk in hospital room?: A Little Help needed climbing 3-5 steps with a railing? : A Lot 6 Click Score: 17    End of Session Equipment Utilized During Treatment: Gait belt Activity Tolerance: Patient tolerated treatment well;Patient limited by fatigue Patient left: with call bell/phone within reach;in chair;with chair alarm set Nurse Communication: Mobility status PT Visit Diagnosis: Other abnormalities of gait and mobility (R26.89);Muscle weakness (generalized) (M62.81);Pain;Difficulty in walking, not elsewhere classified (R26.2) Pain - Right/Left: Right Pain - part of body: Hip     Time: 8963-8941 PT Time Calculation (min) (ACUTE ONLY): 22 min  Charges:    $Gait Training: 8-22 mins PT General Charges $$ ACUTE PT VISIT: 1 Visit                     Oluwaferanmi Wain, PT  Acute Rehab Dept ([facility]) [phone]  09/03/2024    [facility] 09/03/2024, 12:04 PM

## 2024-09-03 NOTE — Discharge Summary (Signed)
 "  Physician Discharge Summary  Casey Olson FMW:[id number] DOB: [birth year] DOA: 08/30/2024  PCP: Casey Atlas, MD  Admit date: 08/30/2024 Discharge date: 09/03/2024  Admitted From: home Disposition:  home  Recommendations for Outpatient Follow-up:  Follow up with PCP in 1-2 weeks Follow-up with orthopedic surgery in 1 to 2 weeks  Home Health: PT Equipment/Devices: walker  Discharge Condition: stable CODE STATUS: Full code Diet Orders (From admission, onward)     Start     Ordered   08/31/24 1419  Diet Carb Modified Room service appropriate? Yes  Diet effective now       Question Answer Comment  Calorie Level Medium 1600-2000   Fluid consistency: Thin   Room service appropriate? Yes      08/31/24 1418            Brief Narrative / Interim history: 74 year old female with breast cancer in remission, DM2 with peripheral neuropathy, HTN, HLD, GERD who comes into the hospital with a mechanical fall, on ice while walking to her mailbox.  Found to have a hip fracture, Ortho was consulted and she was taken to the OR for repair on 1/29  Hospital Course / Discharge diagnoses: Principal problem Right femoral neck fracture-orthopedic surgery consulted, she was taken to the OR on 1/29 status post total hip arthroplasty by Dr. Fidel.  She has recovered well following the surgery, able to work with physical therapy, ambulatory and will be discharged home in stable condition.  Continue pain control and DVT prophylaxis per orthopedic surgery   Active problems Essential hypertension-continue home regimen on discharge Normocytic anemia-monitor postoperatively hemoglobin overall stable Hyperlipidemia-continue statin DM2-continue home regimen on discharge, most recent A1c 6.6  Sepsis ruled out   Discharge Instructions   Allergies as of 09/03/2024       Reactions   Percocet [oxycodone -acetaminophen ] Nausea And Vomiting        Medication List     PAUSE taking these  medications    aspirin  EC 81 MG tablet Wait to take this until your doctor or other care provider tells you to start again. Take 81 mg by mouth daily. You also have another medication with the same name that you may need to continue taking.       TAKE these medications    acetaminophen  325 MG tablet Commonly known as: TYLENOL  Take 650 mg by mouth every 6 (six) hours as needed for mild pain.   aspirin  81 MG chewable tablet Commonly known as: Aspirin  Childrens Chew 1 tablet (81 mg total) by mouth 2 (two) times daily with a meal. What changed: Another medication with the same name was paused. Ask your nurse or doctor if you should take this medication.   CALTRATE 600+D PO Take 1 tablet by mouth 2 (two) times daily.   Cequa 0.09 % Soln Generic drug: cycloSPORINE (PF) Apply 1 drop to eye 2 (two) times daily.   cetirizine 10 MG tablet Commonly known as: ZYRTEC Take 10 mg by mouth at bedtime as needed.   gabapentin  300 MG capsule Commonly known as: NEURONTIN  Take 300 mg by mouth 3 (three) times daily.   glipiZIDE  5 MG tablet Commonly known as: Glucotrol  Take 1 tablet (5 mg total) by mouth daily before breakfast.   HYDROcodone -acetaminophen  5-325 MG tablet Commonly known as: NORCO/VICODIN Take 1 tablet by mouth every 4 (four) hours as needed for up to 7 days for moderate pain (pain score 4-6) or severe pain (pain score 7-10).   lidocaine  5 % Commonly known  as: Lidoderm  Place 1 patch onto the skin daily. Remove & Discard patch within 12 hours or as directed by MD   losartan  100 MG tablet Commonly known as: COZAAR  Take 100 mg by mouth daily.   metFORMIN  1000 MG tablet Commonly known as: GLUCOPHAGE  Take 1,000 mg by mouth 2 (two) times daily.   multivitamin with minerals Tabs tablet Take 1 tablet by mouth daily.   OneTouch Delica Lancets Fine Misc USE TO TEST 3 TIMES DAILY DX E11.65   OneTouch Verio Flex System w/Device Kit USE TO TEST 3 TIMES DAILY DX E11.65    OneTouch Verio test strip Generic drug: glucose blood USE TO TEST 3 TIMES DAILY DX E11.65   Ozempic (2 MG/DOSE) 8 MG/3ML Sopn Generic drug: Semaglutide (2 MG/DOSE) Inject 2 mg into the skin once a week.   simvastatin  10 MG tablet Commonly known as: ZOCOR  Take 10 mg by mouth daily at 6 PM.               Durable Medical Equipment  (From admission, onward)           Start     Ordered   09/02/24 1108  For home use only DME Walker rolling  Once       Question Answer Comment  Walker: With 5 Inch Wheels   Patient needs a walker to treat with the following condition Hip fracture (HCC)      09/02/24 1107            Contact information for follow-up providers     [place], Casey RAMAN, PA-C. Schedule an appointment as soon as possible for a visit in 2 week(s).   Specialty: Orthopedic Surgery Why: For suture removal, For wound re-check Contact information: [address] [place] [address] [phone]              Contact information for after-discharge care     Home Medical Care     [facility] [place] [facility]) .   Service: Home Health Services Contact information: [address] [place]  [address] [phone]                     Consultations: Orthopedic surgery  Procedures/Studies:  VAS US  LOWER EXTREMITY VENOUS (DVT) Result Date: 09/02/2024  Lower Venous DVT Study Patient Name:  Casey Olson [facility]  Date of Exam:   09/02/2024 Medical Rec #: [id number]          Accession #:    [id number] Date of Birth: 1951-03-11          Patient Gender: F Patient Age:   83 years Exam Location:  [facility] Procedure:      VAS US  LOWER EXTREMITY VENOUS (DVT) Referring Phys: Casey Olson --------------------------------------------------------------------------------  Indications: Edema, and Status post fall resulting in displaced right femoral neck fracture and right total hip arthroplasty,  anterior approach 08/31/24  Risk Factors: Cancer: Right breast 07/2016, now in remission. Limitations: Bandages and Inability to rotate right leg secondary to hip surgery. Comparison Study: No prior study on file Performing Technologist: Alberta Lis RVS  Examination Guidelines: A complete evaluation includes B-mode imaging, spectral Doppler, color Doppler, and power Doppler as needed of all accessible portions of each vessel. Bilateral testing is considered an integral part of a complete examination. Limited examinations for reoccurring indications may be performed as noted. The reflux portion of the exam is performed with the patient in reverse Trendelenburg.  +---------+---------------+---------+-----------+----------+-------------------+ RIGHT  CompressibilityPhasicitySpontaneityPropertiesThrombus Aging      +---------+---------------+---------+-----------+----------+-------------------+ CFV                     Yes      Yes                  patent by color and                                                       Doppler             +---------+---------------+---------+-----------+----------+-------------------+ SFJ                                                   Not well visualized +---------+---------------+---------+-----------+----------+-------------------+ FV Prox  Full           Yes      Yes                                      +---------+---------------+---------+-----------+----------+-------------------+ FV Mid   Full           Yes      Yes                                      +---------+---------------+---------+-----------+----------+-------------------+ FV DistalFull           Yes      Yes                                      +---------+---------------+---------+-----------+----------+-------------------+ PFV      Full           Yes      Yes                                       +---------+---------------+---------+-----------+----------+-------------------+ POP                     Yes      Yes                  patent by color and                                                       Doppler             +---------+---------------+---------+-----------+----------+-------------------+ PTV      Full                                                             +---------+---------------+---------+-----------+----------+-------------------+  PERO     Full                                                             +---------+---------------+---------+-----------+----------+-------------------+   +---------+---------------+---------+-----------+----------+--------------+ LEFT     CompressibilityPhasicitySpontaneityPropertiesThrombus Aging +---------+---------------+---------+-----------+----------+--------------+ CFV      Full           Yes      Yes                                 +---------+---------------+---------+-----------+----------+--------------+ SFJ      Full                                                        +---------+---------------+---------+-----------+----------+--------------+ FV Prox  Full           Yes      Yes                                 +---------+---------------+---------+-----------+----------+--------------+ FV Mid   Full           Yes      Yes                                 +---------+---------------+---------+-----------+----------+--------------+ FV DistalFull           Yes      Yes                                 +---------+---------------+---------+-----------+----------+--------------+ PFV      Full           Yes      Yes                                 +---------+---------------+---------+-----------+----------+--------------+ POP      Full           Yes      Yes                                 +---------+---------------+---------+-----------+----------+--------------+ PTV       Full                                                        +---------+---------------+---------+-----------+----------+--------------+ PERO     Full                                                        +---------+---------------+---------+-----------+----------+--------------+  Summary: RIGHT: - There is no evidence of deep vein thrombosis in the lower extremity.  LEFT: - There is no evidence of deep vein thrombosis in the lower extremity.  *See table(s) above for measurements and observations. Electronically signed by Gaile New MD on 09/02/2024 at 1:24:49 PM.    Final    DG Pelvis Portable Result Date: 08/31/2024 EXAM: 1-2 VIEW(S) XRAY OF THE PELVIS 08/31/2024 01:49:00 PM COMPARISON: 08/30/2024 CLINICAL HISTORY: Closed displaced fracture of right femoral neck (HCC). FINDINGS: BONES AND JOINTS: Interval right total hip arthroplasty using noncemented components. Components appear well seated. No acute fracture. No malalignment. SOFT TISSUES: Subcutaneous emphysema and surgical skin staples lateral to the hip. Vascular calcifications. IMPRESSION: 1. Interval right total hip arthroplasty using noncemented components, with well-seated components. 2. Subcutaneous emphysema and surgical skin staples lateral to the hip consistent with recent surgery . Electronically signed by: Elsie Gravely MD 08/31/2024 09:06 PM EST RP Workstation: [id number]   DG HIP UNILAT WITH PELVIS 1V RIGHT Result Date: 08/31/2024 EXAM: 1 VIEW(S) XRAY OF THE RIGHT HIP 08/31/2024 12:06:00 PM COMPARISON: Comparison with 08/30/2024. CLINICAL HISTORY: Surgery, elective. FINDINGS: Intraoperative fluoroscopy was utilized for surgical control purposes. Fluoroscopy time: 7 seconds. Dose: 1.32 mGy. Spot images provided: 2. Images demonstrate placement of a right hip arthroplasty using noncemented components. Visualized components appear well seated. IMPRESSION: 1. Intraoperative fluoroscopy was obtained for surgical control  purposes, demonstrating placement of a right hip arthroplasty. Electronically signed by: Elsie Gravely MD 08/31/2024 09:04 PM EST RP Workstation: [id number]   DG C-Arm 1-60 Min-No Report Result Date: 08/31/2024 Fluoroscopy was utilized by the requesting physician.  No radiographic interpretation.   DG C-Arm 1-60 Min-No Report Result Date: 08/31/2024 Fluoroscopy was utilized by the requesting physician.  No radiographic interpretation.   CT Hip Right Wo Contrast Result Date: 08/30/2024 EXAM: CT OF THE RIGHT HIP WITHOUT IV CONTRAST 08/30/2024 11:29:11 PM TECHNIQUE: CT of the right hip was performed without the administration of intravenous contrast. Multiplanar reformatted images are provided for review. Automated exposure control, iterative reconstruction, and/or weight based adjustment of the mA/kV was utilized to reduce the radiation dose to as low as reasonably achievable. COMPARISON: Right hip radiographs 08/30/2026. CLINICAL HISTORY: Right femoral neck fracture. FINDINGS: BONES: Transverse comminuted fracture of the right femoral neck with mild superior subluxation of the distal fracture fragment resulting in varus angulation. No intertrochanteric involvement. No dislocation at the hip joint. Visualized right hemipelvis is intact. No aggressive appearing osseous abnormality or periostitis. SOFT TISSUE: No significant soft tissue edema or fluid collections. No soft tissue mass. No intramuscular hematoma. Visualized soft tissue pelvic structures appear intact. JOINT: No significant degenerative changes. No osseous erosions. INTRAPELVIC CONTENTS: Limited images of the intrapelvic contents are unremarkable. Visualized soft tissue pelvic structures appear intact. IMPRESSION: 1. Transverse comminuted fracture of the right femoral neck with mild superior subluxation of the distal fracture fragment resulting in varus angulation, without intertrochanteric involvement or hip dislocation. Electronically signed  by: Elsie Gravely MD 08/30/2024 11:40 PM EST RP Workstation: [id number]   DG Chest Portable 1 View Result Date: 08/30/2024 EXAM: 1 VIEW(S) XRAY OF THE CHEST 08/30/2024 11:03:00 PM COMPARISON: 08/26/2016 CLINICAL HISTORY: Preoperative evaluation for hip surgery FINDINGS: LUNGS AND PLEURA: Poor inspiratory effort. Vascular congestion is noted centrally, likely accentuated by poor inspiratory effort. No focal pulmonary opacity. No pleural effusion. No pneumothorax. HEART AND MEDIASTINUM: Aortic atherosclerosis. No acute abnormality of the cardiac and mediastinal silhouettes. BONES AND SOFT TISSUES: Surgical clips in right axilla. No acute osseous abnormality. IMPRESSION:  1. Mild central vascular congestion, likely accentuated by poor inspiratory effort. 2. No acute findings. Electronically signed by: Oneil Devonshire MD 08/30/2024 11:13 PM EST RP Workstation: [id number]   DG Knee 2 Views Right Result Date: 08/30/2024 EXAM: 1 OR 2 VIEW(S) XRAY OF THE RIGHT KNEE 08/30/2024 08:47:00 PM COMPARISON: None available. CLINICAL HISTORY: Right knee pain status post fall. FINDINGS: BONES AND JOINTS: No acute fracture. No malalignment. No significant joint effusion. SOFT TISSUES: Vascular calcifications. IMPRESSION: 1. No acute fracture or dislocation. Electronically signed by: Morgane Naveau MD 08/30/2024 09:06 PM EST RP Workstation: [id number]   DG Femur Min 2 Views Right Result Date: 08/30/2024 EXAM: 2 VIEW(S) XRAY OF THE RIGHT FEMUR 08/30/2024 08:47:00 PM COMPARISON: None available. CLINICAL HISTORY: Right lateral trochanter pain. FINDINGS: BONES AND JOINTS: Acute mildly displaced right femoral neck fracture with mild apex anterior angulation. No acute fracture of the femur distally. No dislocation of the hip or knee. SOFT TISSUES: Vascular calcifications noted. IMPRESSION: 1. Acute mildly displaced right femoral neck fracture with mild apex anterior angulation. Electronically signed by: Morgane Naveau MD 08/30/2024  09:05 PM EST RP Workstation: [id number]   DG Pelvis 1-2 Views Addendum Date: 08/30/2024 ** ADDENDUM #1 ** ADDENDUM: Acute minimally displaced and impacted right femoral neck fracture. ---------------------------------------------------- Electronically signed by: Morgane Naveau MD 08/30/2024 09:04 PM EST RP Workstation: [id number]   Result Date: 08/30/2024 ** ORIGINAL REPORT ** EXAM: 2 or 3 VIEW(S) XRAY OF THE PELVIS 08/30/2024 08:47:00 PM COMPARISON: None available. CLINICAL HISTORY: Fall, right hip pain. FINDINGS: BONES AND JOINTS: No acute fracture. No malalignment. Lower lumbar degenerative change. Bilateral hip degenerative changes. SOFT TISSUES: Vascular calcification present. IMPRESSION: 1. No acute fracture or dislocation. Electronically signed by: Morgane Naveau MD 08/30/2024 09:01 PM EST RP Workstation: [id number]     Subjective: - no chest pain, shortness of breath, no abdominal pain, nausea or vomiting.   Discharge Exam: BP (!) 152/67 (BP Location: Left Arm)   Pulse 93   Temp 98.9 F (37.2 C) (Oral)   Resp 18   Ht 5' 3 (1.6 m)   Wt 73.5 kg   SpO2 98%   BMI 28.70 kg/m   General: Pt is alert, awake, not in acute distress Cardiovascular: RRR, S1/S2 +, no rubs, no gallops Respiratory: CTA bilaterally, no wheezing, no rhonchi Abdominal: Soft, NT, ND, bowel sounds + Extremities: no edema, no cyanosis    The results of significant diagnostics from this hospitalization (including imaging, microbiology, ancillary and laboratory) are listed below for reference.     Microbiology: No results found for this or any previous visit (from the past 240 hours).   Labs: Basic Metabolic Panel: Recent Labs  Lab 08/31/24 0015 09/01/24 0329 09/02/24 0333  NA 138 135 137  K 4.1 4.3 3.8  CL 102 102 104  CO2 25 21* 24  GLUCOSE 122* 134* 174*  BUN 16 16 11   CREATININE 0.59 0.73 0.59  CALCIUM 9.8 8.5* 8.6*  MG  --  2.1  --    Liver Function Tests: Recent Labs  Lab  09/01/24 0329  AST 28  ALT 17  ALKPHOS 57  BILITOT 0.4  PROT 6.6  ALBUMIN  3.6   CBC: Recent Labs  Lab 08/31/24 0015 08/31/24 0525 09/01/24 0329 09/02/24 0333  WBC 14.0* 10.3 13.3* 11.9*  NEUTROABS 10.3*  --   --   --   HGB 12.9 10.8* 10.0* 9.7*  HCT 40.4 33.4* 31.1* 29.4*  MCV 88.0 88.1 88.4 86.0  PLT 253 213 184 173  CBG: Recent Labs  Lab 09/02/24 1152 09/02/24 1740 09/02/24 2135 09/03/24 0840 09/03/24 1120  GLUCAP 195* 174* 135* 132* 236*   Hgb A1c No results for input(s): HGBA1C in the last 72 hours. Lipid Profile No results for input(s): CHOL, HDL, LDLCALC, TRIG, CHOLHDL, LDLDIRECT in the last 72 hours. Thyroid function studies No results for input(s): TSH, T4TOTAL, T3FREE, THYROIDAB in the last 72 hours.  Invalid input(s): FREET3 Urinalysis No results found for: COLORURINE, APPEARANCEUR, LABSPEC, PHURINE, GLUCOSEU, HGBUR, BILIRUBINUR, KETONESUR, PROTEINUR, UROBILINOGEN, NITRITE, LEUKOCYTESUR  FURTHER DISCHARGE INSTRUCTIONS:   Get Medicines reviewed and adjusted: Please take all your medications with you for your next visit with your Primary MD   Laboratory/radiological data: Please request your Primary MD to go over all hospital tests and procedure/radiological results at the follow up, please ask your Primary MD to get all Hospital records sent to his/her office.   In some cases, they will be blood work, cultures and biopsy results pending at the time of your discharge. Please request that your primary care M.D. goes through all the records of your hospital data and follows up on these results.   Also Note the following: If you experience worsening of your admission symptoms, develop shortness of breath, life threatening emergency, suicidal or homicidal thoughts you must seek medical attention immediately by calling 911 or calling your MD immediately  if symptoms less severe.   You must read complete  instructions/literature along with all the possible adverse reactions/side effects for all the Medicines you take and that have been prescribed to you. Take any new Medicines after you have completely understood and accpet all the possible adverse reactions/side effects.    Do not drive when taking Pain medications or sleeping medications (Benzodaizepines)   Do not take more than prescribed Pain, Sleep and Anxiety Medications. It is not advisable to combine anxiety,sleep and pain medications without talking with your primary care practitioner   Special Instructions: If you have smoked or chewed Tobacco  in the last 2 yrs please stop smoking, stop any regular Alcohol   and or any Recreational drug use.   Wear Seat belts while driving.   Please note: You were cared for by a hospitalist during your hospital stay. Once you are discharged, your primary care physician will handle any further medical issues. Please note that NO REFILLS for any discharge medications will be authorized once you are discharged, as it is imperative that you return to your primary care physician (or establish a relationship with a primary care physician if you do not have one) for your post hospital discharge needs so that they can reassess your need for medications and monitor your lab values.  Time coordinating discharge: 35 minutes  SIGNED:  Nilda Fendt, MD, PhD 09/03/2024, 11:48 AM   "
